# Patient Record
Sex: Female | Born: 1960 | Race: Black or African American | Hispanic: No | Smoking: Never smoker
Health system: Southern US, Community
[De-identification: ages and names within clinical notes are randomized; demographics above are authoritative.]

## PROBLEM LIST (undated history)

## (undated) DIAGNOSIS — J189 Pneumonia, unspecified organism: Secondary | ICD-10-CM

## (undated) DIAGNOSIS — F419 Anxiety disorder, unspecified: Secondary | ICD-10-CM

## (undated) DIAGNOSIS — Z973 Presence of spectacles and contact lenses: Secondary | ICD-10-CM

## (undated) DIAGNOSIS — J45909 Unspecified asthma, uncomplicated: Secondary | ICD-10-CM

## (undated) DIAGNOSIS — G43909 Migraine, unspecified, not intractable, without status migrainosus: Secondary | ICD-10-CM

## (undated) DIAGNOSIS — N879 Dysplasia of cervix uteri, unspecified: Secondary | ICD-10-CM

## (undated) DIAGNOSIS — M1990 Unspecified osteoarthritis, unspecified site: Secondary | ICD-10-CM

---

## 1997-03-23 ENCOUNTER — Inpatient Hospital Stay (HOSPITAL_COMMUNITY): Admission: AD | Admit: 1997-03-23 | Discharge: 1997-03-23 | Payer: Self-pay | Admitting: Obstetrics

## 1997-04-07 ENCOUNTER — Encounter: Admission: RE | Admit: 1997-04-07 | Discharge: 1997-07-06 | Payer: Self-pay | Admitting: Obstetrics

## 1997-04-25 ENCOUNTER — Ambulatory Visit (HOSPITAL_COMMUNITY): Admission: RE | Admit: 1997-04-25 | Discharge: 1997-04-25 | Payer: Self-pay | Admitting: Obstetrics & Gynecology

## 1997-07-12 ENCOUNTER — Encounter: Admission: RE | Admit: 1997-07-12 | Discharge: 1997-10-10 | Payer: Self-pay | Admitting: Obstetrics

## 1997-07-27 ENCOUNTER — Ambulatory Visit (HOSPITAL_COMMUNITY): Admission: RE | Admit: 1997-07-27 | Discharge: 1997-07-27 | Payer: Self-pay | Admitting: Obstetrics & Gynecology

## 1997-09-21 ENCOUNTER — Inpatient Hospital Stay (HOSPITAL_COMMUNITY): Admission: AD | Admit: 1997-09-21 | Discharge: 1997-09-28 | Payer: Self-pay | Admitting: Obstetrics

## 1997-09-29 ENCOUNTER — Ambulatory Visit (HOSPITAL_COMMUNITY): Admission: RE | Admit: 1997-09-29 | Discharge: 1997-09-29 | Payer: Self-pay | Admitting: Obstetrics & Gynecology

## 1997-09-29 ENCOUNTER — Encounter: Payer: Self-pay | Admitting: Obstetrics & Gynecology

## 1997-09-29 ENCOUNTER — Inpatient Hospital Stay (HOSPITAL_COMMUNITY): Admission: AD | Admit: 1997-09-29 | Discharge: 1997-10-04 | Payer: Self-pay | Admitting: Obstetrics & Gynecology

## 1997-09-29 ENCOUNTER — Emergency Department (HOSPITAL_COMMUNITY): Admission: EM | Admit: 1997-09-29 | Discharge: 1997-09-29 | Payer: Self-pay | Admitting: Emergency Medicine

## 1997-10-02 ENCOUNTER — Encounter: Payer: Self-pay | Admitting: Pulmonary Disease

## 1997-10-03 ENCOUNTER — Encounter: Payer: Self-pay | Admitting: Pulmonary Disease

## 1997-11-22 ENCOUNTER — Emergency Department (HOSPITAL_COMMUNITY): Admission: EM | Admit: 1997-11-22 | Discharge: 1997-11-22 | Payer: Self-pay | Admitting: Emergency Medicine

## 1998-02-06 ENCOUNTER — Emergency Department (HOSPITAL_COMMUNITY): Admission: EM | Admit: 1998-02-06 | Discharge: 1998-02-06 | Payer: Self-pay | Admitting: Emergency Medicine

## 1998-06-18 ENCOUNTER — Emergency Department (HOSPITAL_COMMUNITY): Admission: EM | Admit: 1998-06-18 | Discharge: 1998-06-18 | Payer: Self-pay | Admitting: Unknown Physician Specialty

## 1998-06-20 ENCOUNTER — Emergency Department (HOSPITAL_COMMUNITY): Admission: EM | Admit: 1998-06-20 | Discharge: 1998-06-20 | Payer: Self-pay | Admitting: Emergency Medicine

## 1999-02-10 ENCOUNTER — Emergency Department (HOSPITAL_COMMUNITY): Admission: EM | Admit: 1999-02-10 | Discharge: 1999-02-10 | Payer: Self-pay | Admitting: Emergency Medicine

## 1999-02-10 ENCOUNTER — Encounter: Payer: Self-pay | Admitting: Emergency Medicine

## 1999-03-14 ENCOUNTER — Emergency Department (HOSPITAL_COMMUNITY): Admission: EM | Admit: 1999-03-14 | Discharge: 1999-03-14 | Payer: Self-pay | Admitting: Emergency Medicine

## 1999-09-03 ENCOUNTER — Emergency Department (HOSPITAL_COMMUNITY): Admission: EM | Admit: 1999-09-03 | Discharge: 1999-09-03 | Payer: Self-pay | Admitting: Emergency Medicine

## 1999-12-17 ENCOUNTER — Emergency Department (HOSPITAL_COMMUNITY): Admission: EM | Admit: 1999-12-17 | Discharge: 1999-12-17 | Payer: Self-pay | Admitting: Emergency Medicine

## 2000-03-04 ENCOUNTER — Emergency Department (HOSPITAL_COMMUNITY): Admission: EM | Admit: 2000-03-04 | Discharge: 2000-03-04 | Payer: Self-pay | Admitting: *Deleted

## 2000-04-13 ENCOUNTER — Emergency Department (HOSPITAL_COMMUNITY): Admission: EM | Admit: 2000-04-13 | Discharge: 2000-04-13 | Payer: Self-pay | Admitting: Emergency Medicine

## 2000-04-15 ENCOUNTER — Inpatient Hospital Stay (HOSPITAL_COMMUNITY): Admission: EM | Admit: 2000-04-15 | Discharge: 2000-04-18 | Payer: Self-pay | Admitting: Emergency Medicine

## 2000-08-09 ENCOUNTER — Emergency Department (HOSPITAL_COMMUNITY): Admission: EM | Admit: 2000-08-09 | Discharge: 2000-08-09 | Payer: Self-pay

## 2000-09-08 ENCOUNTER — Emergency Department (HOSPITAL_COMMUNITY): Admission: EM | Admit: 2000-09-08 | Discharge: 2000-09-08 | Payer: Self-pay | Admitting: Emergency Medicine

## 2001-10-18 ENCOUNTER — Emergency Department (HOSPITAL_COMMUNITY): Admission: EM | Admit: 2001-10-18 | Discharge: 2001-10-18 | Payer: Self-pay | Admitting: *Deleted

## 2001-12-19 ENCOUNTER — Emergency Department (HOSPITAL_COMMUNITY): Admission: EM | Admit: 2001-12-19 | Discharge: 2001-12-19 | Payer: Self-pay | Admitting: Emergency Medicine

## 2002-03-20 ENCOUNTER — Encounter: Payer: Self-pay | Admitting: Emergency Medicine

## 2002-03-20 ENCOUNTER — Emergency Department (HOSPITAL_COMMUNITY): Admission: EM | Admit: 2002-03-20 | Discharge: 2002-03-20 | Payer: Self-pay | Admitting: Emergency Medicine

## 2002-05-09 ENCOUNTER — Emergency Department (HOSPITAL_COMMUNITY): Admission: EM | Admit: 2002-05-09 | Discharge: 2002-05-09 | Payer: Self-pay | Admitting: Emergency Medicine

## 2002-07-22 ENCOUNTER — Ambulatory Visit (HOSPITAL_COMMUNITY): Admission: RE | Admit: 2002-07-22 | Discharge: 2002-07-22 | Payer: Self-pay | Admitting: Obstetrics

## 2002-07-22 ENCOUNTER — Encounter: Payer: Self-pay | Admitting: Obstetrics

## 2002-07-28 ENCOUNTER — Encounter (INDEPENDENT_AMBULATORY_CARE_PROVIDER_SITE_OTHER): Payer: Self-pay

## 2002-07-28 ENCOUNTER — Ambulatory Visit (HOSPITAL_COMMUNITY): Admission: RE | Admit: 2002-07-28 | Discharge: 2002-07-28 | Payer: Self-pay | Admitting: Obstetrics

## 2002-07-28 HISTORY — PX: HYSTEROSCOPY WITH D & C: SHX1775

## 2003-02-04 ENCOUNTER — Emergency Department (HOSPITAL_COMMUNITY): Admission: EM | Admit: 2003-02-04 | Discharge: 2003-02-04 | Payer: Self-pay | Admitting: Emergency Medicine

## 2003-03-05 ENCOUNTER — Ambulatory Visit (HOSPITAL_BASED_OUTPATIENT_CLINIC_OR_DEPARTMENT_OTHER): Admission: RE | Admit: 2003-03-05 | Discharge: 2003-03-05 | Payer: Self-pay | Admitting: Internal Medicine

## 2003-08-08 ENCOUNTER — Encounter: Admission: RE | Admit: 2003-08-08 | Discharge: 2003-11-06 | Payer: Self-pay | Admitting: *Deleted

## 2003-10-08 ENCOUNTER — Emergency Department (HOSPITAL_COMMUNITY): Admission: EM | Admit: 2003-10-08 | Discharge: 2003-10-08 | Payer: Self-pay | Admitting: Unknown Physician Specialty

## 2003-10-10 ENCOUNTER — Emergency Department (HOSPITAL_COMMUNITY): Admission: EM | Admit: 2003-10-10 | Discharge: 2003-10-10 | Payer: Self-pay | Admitting: Emergency Medicine

## 2003-12-25 ENCOUNTER — Emergency Department (HOSPITAL_COMMUNITY): Admission: EM | Admit: 2003-12-25 | Discharge: 2003-12-25 | Payer: Self-pay | Admitting: Emergency Medicine

## 2004-01-10 ENCOUNTER — Emergency Department (HOSPITAL_COMMUNITY): Admission: EM | Admit: 2004-01-10 | Discharge: 2004-01-10 | Payer: Self-pay | Admitting: Emergency Medicine

## 2005-01-29 ENCOUNTER — Emergency Department (HOSPITAL_COMMUNITY): Admission: EM | Admit: 2005-01-29 | Discharge: 2005-01-30 | Payer: Self-pay | Admitting: *Deleted

## 2005-12-17 ENCOUNTER — Emergency Department (HOSPITAL_COMMUNITY): Admission: EM | Admit: 2005-12-17 | Discharge: 2005-12-17 | Payer: Self-pay | Admitting: Emergency Medicine

## 2006-07-19 ENCOUNTER — Emergency Department (HOSPITAL_COMMUNITY): Admission: EM | Admit: 2006-07-19 | Discharge: 2006-07-19 | Payer: Self-pay | Admitting: Emergency Medicine

## 2009-03-05 ENCOUNTER — Emergency Department (HOSPITAL_COMMUNITY): Admission: EM | Admit: 2009-03-05 | Discharge: 2009-03-05 | Payer: Self-pay | Admitting: Emergency Medicine

## 2009-04-15 ENCOUNTER — Inpatient Hospital Stay (HOSPITAL_COMMUNITY): Admission: EM | Admit: 2009-04-15 | Discharge: 2009-04-19 | Payer: Self-pay | Admitting: Emergency Medicine

## 2009-04-15 DIAGNOSIS — J13 Pneumonia due to Streptococcus pneumoniae: Secondary | ICD-10-CM | POA: Insufficient documentation

## 2009-04-15 DIAGNOSIS — J45909 Unspecified asthma, uncomplicated: Secondary | ICD-10-CM | POA: Insufficient documentation

## 2009-04-15 DIAGNOSIS — G43909 Migraine, unspecified, not intractable, without status migrainosus: Secondary | ICD-10-CM | POA: Insufficient documentation

## 2009-04-15 LAB — CONVERTED CEMR LAB
CO2: 25 meq/L
Calcium: 9.1 mg/dL
Chloride: 110 meq/L
Creatinine, Ser: 0.86 mg/dL
Glucose, Bld: 122 mg/dL
MCHC: 34.1 g/dL
RBC: 4.07 M/uL
RDW: 13.9 %
WBC: 6.1 10*3/uL

## 2009-05-10 ENCOUNTER — Ambulatory Visit: Payer: Self-pay | Admitting: Internal Medicine

## 2009-05-10 DIAGNOSIS — F411 Generalized anxiety disorder: Secondary | ICD-10-CM | POA: Insufficient documentation

## 2009-05-10 DIAGNOSIS — E669 Obesity, unspecified: Secondary | ICD-10-CM | POA: Insufficient documentation

## 2010-02-06 NOTE — Assessment & Plan Note (Signed)
Summary: New - Establish Care   Vital Signs:  Patient profile:   50 year old female LMP:     04/2009 Height:      61.50 inches Weight:      233.5 pounds BMI:     43.56 BSA:     2.03 O2 Sat:      100 % Temp:     97.9 degrees F oral Pulse rate:   75 / minute Pulse rhythm:   regular Resp:     20 per minute BP sitting:   120 / 90  (left arm) Cuff size:   regular  Vitals Entered By: Levon Hedger (May 10, 2009 11:54 AM)  Serial Vital Signs/Assessments:  Comments: 12:13 PM P/F  300, 300, 300 By: Levon Hedger   CC: follow-up visit Audrey Weeks...cough, chest discomfort, Depression Is Patient Diabetic? No Pain Assessment Patient in pain? yes     Location: chest  Does patient need assistance? Functional Status Self care Ambulation Normal Comments pt did not get Flovent too expensive. LMP (date): 04/2009     Enter LMP: 04/2009   CC:  follow-up visit Audrey Weeks...cough, chest discomfort, and Depression.  History of Present Illness:  Pt into the office to [name] care. Pt was last seen by Dr. Viann Shove at [facility] last 3 years ago.  Hospitalized from 04/15/2009 to 04/19/2009 for community acquired pneumonia Presented to the ER with increasing shortness of breath for 2-3 days prior to presentation.  Pt was treating herself at home for cough and congestion but symptoms persisted.   Pt was treated with IV avelox and she was then switched to levaquin for 5 days of which she has finished. 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    Depression History:      The patient denies recurrent thoughts of death or suicide.      Psychosocial stress factors include major life changes.  The patient denies that she feels like life is not worth living, denies that she wishes that she were dead, and denies that she has thought about ending her life.        Comments:  Started on clonazepam during her recent hospitalization.  She was given 60 tablets and has been taking very sparingly.  She was previously taken effexor in the past and she tolerated well but symptoms improved so she stopped taking the medication.  Depression Treatment History:  Prior Medication Used:   Start Date: Assessment of Effect:   Comments:  Effexor (venlafaxine)     --       --       started    Habits & Providers  Alcohol-Tobacco-Diet     Alcohol drinks/day: <1     Tobacco Status: never  Exercise-Depression-Behavior     Does Patient Exercise: no     Drug Use: past  Allergies (verified): No Known Drug Allergies  Past History:  Past Surgical History: tubal ligation c-section  Family History: maternal grandmother - diabetes  Social History: 5 children Tobacco - none ETOH -social Drug - recovering addict sonce 2000Smoking Status:  never Drug Use:  past Does Patient Exercise:  no  Review of Systems Resp:  Complains of chest discomfort, cough, shortness of breath, and wheezing. GI:  Denies nausea and vomiting. MS:  back pain that started with recent pneumonia illness. Psych:  Complains of anxiety; recent loss of father and stessors from adult sons .  Physical Exam  General:  alert.  obese Head:  normocephalic.   Ears:  ear piercing(s) noted.   Lungs:  poor airmovement diffuse wheezes throughout Msk:  normal ROM.   Neurologic:  alert & oriented X3.     Impression & Recommendations:  Problem # 1:  ASTHMA (ICD-493.90) Will give neb today in office will given depomedrol IM pt given a sample of flovent advised her to continue mucinex The following medications were removed from the medication list:    Prednisone 20 Mg  Tabs (Prednisone) .Marland Kitchen... Take as directed per sheet Her updated medication list for this problem includes:    Ventolin Hfa 108 (90 Base) Mcg/act Aers (Albuterol sulfate) .Marland Kitchen..Marland Kitchen Two puffs three times per day as needed for shortness of breath    Flovent Diskus 250 Mcg/blist Aepb (Fluticasone propionate (inhal)) ..... One inhalation two times a day  Orders: Peak Flow Rate (94150) Pulse Oximetry (single measurment) (16109) Depo- Medrol 80mg  (J1040) Admin of Therapeutic Inj  intramuscular or subcutaneous (60454) Nebulizer Tx (09811)  Problem # 2:  OBESITY (ICD-278.00) pt is trying to lose weight continue current efforts  Problem # 3:  ANXIETY (ICD-300.00) will restart effexor as pt has been on previously encouraged pt to take clonazepam sparingly Her updated medication list for this problem includes:    Clonazepam 0.5 Mg Tabs (Clonazepam) ..... One - half tablet by mouth two times a day as needed    Effexor Xr 75 Mg Xr24h-cap (Venlafaxine hcl) ..... One capsule by mouth daily for mood  Complete Medication List: 1)  Clonazepam 0.5 Mg Tabs (Clonazepam) .... One - half tablet by mouth two times a day as needed 2)  Ventolin Hfa 108 (90 Base) Mcg/act Aers (Albuterol sulfate) .... Two puffs three times per day as needed for shortness of breath 3)  Flovent Diskus 250 Mcg/blist Aepb (Fluticasone propionate (inhal)) .... One inhalation two times a day 4)  Effexor Xr 75 Mg Xr24h-cap (Venlafaxine hcl) .... One capsule by mouth daily for mood 5)  Promethazine-codeine 6.25-10 Mg/12ml Syrp (Promethazine-codeine) .... One teaspoon every 6 hours as needed for cough  Asthma Management Plan    Personal best PEF: 300 liters/minute    Predicted PEF: 457 liters/minute    Working PEF: 457 liters/minute    Plan based on PEF formula: [employer] [employer] Zone: (Range: 370 to 460)  FLOVENT DISKUS 250 MCG/BLIST AEPB:  1 inhalation twice a day  Yellow Zone: VENTOLIN HFA 108 (90 BASE) MCG/ACT AERS:  2 puffs  every 4 hours as needed  Red Zone: Call to be seen if no improvement in 48hrs (sooner if worse).    Patient Instructions: 1)  You have been given a breathng treatment along with injections of medications that will help open your airway. 2)  Start flovent - inhale twice daily  3)  May use albuterol inhaler as needed  4)  Restart the mucinex and take twice daily 5)  Be sure to drink plenty of water as this helps to loosen secretions 6)  Keep your eligibility appointment on June 28, 2009.  You can try to walk in for an earlier appointment. 7)  Follow up in this office after  you receive your orange card in 8 weeks. Prescriptions: PROMETHAZINE-CODEINE 6.25-10 MG/5ML SYRP (PROMETHAZINE-CODEINE) One teaspoon every 6 hours as needed for cough  #4 ounces x 0   Entered and Authorized by:   Lehman Prom FNP   Signed by:   Lehman Prom FNP on 05/10/2009   Method used:   Print then Give to Patient   RxID:   [id number] FLOVENT DISKUS 250 MCG/BLIST AEPB (FLUTICASONE PROPIONATE (INHAL)) One inhalation two times a day  #1 x 3   Entered and Authorized by:   Lehman Prom FNP   Signed by:   Lehman Prom FNP on 05/10/2009   Method used:   Print then Give to Patient   RxID:   [id number] FLOVENT DISKUS 250 MCG/BLIST AEPB (FLUTICASONE PROPIONATE (INHAL)) One inhalation two times a day  #1 x 0   Entered and Authorized by:   Lehman Prom FNP   Signed by:   Lehman Prom FNP on 05/10/2009   Method used:   Samples Given   RxID:   [id number] EFFEXOR XR 75 MG XR24H-CAP (VENLAFAXINE HCL) One capsule by mouth daily for mood  #30 x 3   Entered and Authorized by:   Lehman Prom FNP   Signed by:   Lehman Prom FNP on 05/10/2009   Method used:   Print then Give to Patient   RxID:   [id number]    CXR  Procedure date:  04/16/2009  Findings:      low lung volume with bilateral air space disease which may represent pneumonia    Medication  Administration  Injection # 1:    Medication: Depo- Medrol 80mg     Diagnosis: ASTHMA (ICD-493.90)    Route: IM    Site: RUOQ gluteus    Exp Date: 11/06/2009    Lot #: [id number]    Mfr: Pharmacia    Comments: [id number]    Patient tolerated injection without complications    Given by: Vesta Mixer CMA (May 10, 2009 1:31 PM)  Orders Added: 1)  New Patient age 77-64 [8] 2)  Peak Flow Rate [94150] 3)  Pulse Oximetry (single measurment) [94760] 4)  Depo- Medrol 80mg  [J1040] 5)  Admin of Therapeutic Inj  intramuscular or subcutaneous [96372] 6)  Nebulizer Tx [32951]

## 2010-02-07 ENCOUNTER — Emergency Department (HOSPITAL_COMMUNITY): Payer: Self-pay

## 2010-02-07 ENCOUNTER — Emergency Department (HOSPITAL_COMMUNITY)
Admission: EM | Admit: 2010-02-07 | Discharge: 2010-02-07 | Disposition: A | Discharge status: Home / Self Care | Payer: Self-pay | Attending: Emergency Medicine | Admitting: Emergency Medicine

## 2010-02-07 DIAGNOSIS — J40 Bronchitis, not specified as acute or chronic: Secondary | ICD-10-CM | POA: Insufficient documentation

## 2010-02-07 DIAGNOSIS — J45909 Unspecified asthma, uncomplicated: Secondary | ICD-10-CM | POA: Insufficient documentation

## 2010-02-07 DIAGNOSIS — R05 Cough: Secondary | ICD-10-CM | POA: Insufficient documentation

## 2010-02-07 DIAGNOSIS — R059 Cough, unspecified: Secondary | ICD-10-CM | POA: Insufficient documentation

## 2010-03-16 ENCOUNTER — Emergency Department (HOSPITAL_COMMUNITY)
Admission: EM | Admit: 2010-03-16 | Discharge: 2010-03-16 | Disposition: A | Discharge status: Home / Self Care | Payer: Self-pay | Attending: Emergency Medicine | Admitting: Emergency Medicine

## 2010-03-16 DIAGNOSIS — J069 Acute upper respiratory infection, unspecified: Secondary | ICD-10-CM | POA: Insufficient documentation

## 2010-03-16 DIAGNOSIS — G43909 Migraine, unspecified, not intractable, without status migrainosus: Secondary | ICD-10-CM | POA: Insufficient documentation

## 2010-03-16 DIAGNOSIS — J029 Acute pharyngitis, unspecified: Secondary | ICD-10-CM | POA: Insufficient documentation

## 2010-03-16 LAB — RAPID STREP SCREEN (MED CTR MEBANE ONLY): Streptococcus, Group A Screen (Direct): NEGATIVE

## 2010-03-28 LAB — HEPATIC FUNCTION PANEL
Bilirubin, Direct: 0.1 mg/dL (ref 0.0–0.3)
Indirect Bilirubin: 0.2 mg/dL — ABNORMAL LOW (ref 0.3–0.9)
Total Bilirubin: 0.3 mg/dL (ref 0.3–1.2)
Total Protein: 6.8 g/dL (ref 6.0–8.3)

## 2010-03-28 LAB — CBC
HCT: 35.4 % — ABNORMAL LOW (ref 36.0–46.0)
HCT: 36.7 % (ref 36.0–46.0)
Hemoglobin: 12.1 g/dL (ref 12.0–15.0)
MCHC: 33.6 g/dL (ref 30.0–36.0)
MCHC: 34.1 g/dL (ref 30.0–36.0)
MCV: 87.7 fL (ref 78.0–100.0)
Platelets: 238 10*3/uL (ref 150–400)
RDW: 13.9 % (ref 11.5–15.5)
WBC: 5.9 10*3/uL (ref 4.0–10.5)

## 2010-03-28 LAB — BASIC METABOLIC PANEL
BUN: 6 mg/dL (ref 6–23)
Calcium: 8.8 mg/dL (ref 8.4–10.5)
GFR calc Af Amer: >60 mL/min (ref >60)
GFR calc non Af Amer: >60 mL/min (ref >60)
Glucose, Bld: 133 mg/dL — ABNORMAL HIGH (ref 70–99)

## 2010-03-28 LAB — HEMOGLOBIN A1C
Hgb A1c MFr Bld: 6.3 % — ABNORMAL HIGH (ref 4.6–6.1)
Mean Plasma Glucose: 134 mg/dL

## 2010-03-28 LAB — LEGIONELLA ANTIGEN, URINE: Legionella Antigen, Urine: NEGATIVE

## 2010-03-28 LAB — CULTURE, BLOOD (ROUTINE X 2): Culture: NO GROWTH

## 2010-03-28 LAB — EXPECTORATED SPUTUM ASSESSMENT W GRAM STAIN, RFLX TO RESP C

## 2010-03-28 LAB — COMPREHENSIVE METABOLIC PANEL
Alkaline Phosphatase: 48 U/L (ref 39–117)
BUN: 9 mg/dL (ref 6–23)
Calcium: 9.1 mg/dL (ref 8.4–10.5)
GFR calc non Af Amer: >60 mL/min (ref >60)
Glucose, Bld: 122 mg/dL — ABNORMAL HIGH (ref 70–99)
Potassium: 4.2 mEq/L (ref 3.5–5.1)
Total Protein: 6.7 g/dL (ref 6.0–8.3)

## 2010-03-28 LAB — RAPID URINE DRUG SCREEN, HOSP PERFORMED
Barbiturates: NOT DETECTED
Opiates: POSITIVE — AB

## 2010-03-28 LAB — URINE CULTURE: Special Requests: NEGATIVE

## 2010-03-28 LAB — URINALYSIS, ROUTINE W REFLEX MICROSCOPIC
Glucose, UA: NEGATIVE mg/dL
Ketones, ur: NEGATIVE mg/dL
Nitrite: NEGATIVE
Protein, ur: NEGATIVE mg/dL
Specific Gravity, Urine: 1.009 (ref 1.005–1.030)
Urobilinogen, UA: 0.2 mg/dL (ref 0.0–1.0)

## 2010-03-28 LAB — DIFFERENTIAL
Eosinophils Absolute: 0 10*3/uL (ref 0.0–0.7)
Lymphs Abs: 2.9 10*3/uL (ref 0.7–4.0)
Monocytes Relative: 8 % (ref 3–12)
Neutro Abs: 2.5 10*3/uL (ref 1.7–7.7)
Neutrophils Relative %: 42 % — ABNORMAL LOW (ref 43–77)

## 2010-03-28 LAB — GLUCOSE, CAPILLARY
Glucose-Capillary: 164 mg/dL — ABNORMAL HIGH (ref 70–99)
Glucose-Capillary: 191 mg/dL — ABNORMAL HIGH (ref 70–99)

## 2010-03-28 LAB — T3, FREE: T3, Free: 2.3 pg/mL (ref 2.3–4.2)

## 2010-03-28 LAB — CULTURE, RESPIRATORY W GRAM STAIN

## 2010-03-30 LAB — BASIC METABOLIC PANEL
BUN: 13 mg/dL (ref 6–23)
Calcium: 9.3 mg/dL (ref 8.4–10.5)
Chloride: 106 mEq/L (ref 96–112)
Creatinine, Ser: 0.83 mg/dL (ref 0.4–1.2)
GFR calc Af Amer: >60 mL/min (ref >60)
GFR calc non Af Amer: >60 mL/min (ref >60)

## 2010-03-30 LAB — CBC
Platelets: 61 10*3/uL — ABNORMAL LOW (ref 150–400)
RBC: 4.6 MIL/uL (ref 3.87–5.11)
WBC: 7.4 10*3/uL (ref 4.0–10.5)

## 2010-03-30 LAB — POCT CARDIAC MARKERS
Myoglobin, poc: 27.1 ng/mL (ref 12–200)
Troponin i, poc: <0.05 ng/mL (ref 0.00–0.09)

## 2010-03-30 LAB — DIFFERENTIAL
Basophils Relative: 1 % (ref 0–1)
Eosinophils Relative: 2 % (ref 0–5)
Lymphocytes Relative: 37 % (ref 12–46)
Monocytes Relative: 5 % (ref 3–12)
Neutrophils Relative %: 55 % (ref 43–77)

## 2010-05-25 NOTE — Discharge Summary (Signed)
[place]  Patient:    Audrey Weeks, Audrey Weeks                       MRN: [id number] Adm. Date:  04/15/00 Disc. Date: 04/18/00 Attending:  Margit Banda. Jearld Fenton, M.D.                           Discharge Summary  ADMISSION DIAGNOSIS:  Severe tonsillitis with dehydration.  DISCHARGE DIAGNOSIS:  Severe tonsillitis with dehydration.  HISTORY OF PRESENT ILLNESS:  This is a 50 year old who has had a severe onset of tonsillitis.  She has multiple episodes of tonsillitis each year, but this is a significantly worse episode.  She has been treated as an outpatient with antibiotic therapy including 875 mg of Augmentin.  She also has had a Medrol Dosepak and continues to have severe pain and inability to take any by-mouth fluids.  She was admitted for IV hydration and IV therapy.  HOSPITAL COURSE:  She did well in the hospital and resolved reasonably quickly on the regimen of steroids and Unasyn antibiotics.  On April 12, she had significant decrease in the swelling of the tonsil with almost normal appearance and no exudate on the tonsils. There was definitely no evidence of any abscess.  The adenopathy had drastically improved in her neck.  She was able to take fluids and looked well hydrated.  She was discharged to home to continue her Augmentin that had been prescribed as an outpatient.  She also had Lortab elixir for pain.  She would follow up in the office to discuss the possibility of a tonsillectomy. DD:  05/05/00 TD:  05/06/00 Job: [id number]

## 2010-05-25 NOTE — Op Note (Signed)
   [name], Weeks                          ACCOUNT NO.:  [id number]   MEDICAL RECORD NO.:  [id number]                   PATIENT TYPE:  OUT   LOCATION:  [facility]                                  FACILITY:  [facility]   PHYSICIAN:  Kathreen Cosier, M.D.           DATE OF BIRTH:  [birth year]   DATE OF PROCEDURE:  07/28/2002  DATE OF DISCHARGE:                                 OPERATIVE REPORT   PREOPERATIVE DIAGNOSIS:  Dysfunctional uterine bleeding.   PROCEDURE:  Diagnostic D&C and hysteroscopy.   ANESTHESIA:  General anesthesia.   SURGEON:  Kathreen Cosier, M.D.   DESCRIPTION OF PROCEDURE:  Under general anesthesia with the patient in the  __________ position, perineum, abdomen and vagina were prepped and draped.  Bladder emptied with straight catheter.  Bimanual  examination revealed the  uterus to be top normal size, negative adnexa.  Weighted speculum placed in  the vagina.  Cervix grasped with tenaculum and cervix injected with 8 mL of  2% plain Xylocaine at 3, 9 and 12 o'clock.  The endocervix was curetted. A  small amount of tissue obtained.  The endometrial cavity sounded  10 cm.  The cavity was anterior.  The cervix was dilated with #25 Shawnie Pons and  the 5 mm scope placed in the uterine cavity.  Examination of the uterine  cavity revealed no abnormalities.  Scope was removed and a sharp curettage  was performed.  A large amount of tissue was obtained.  There were no polyps  noted.  The deficit of 3% sorbitol was 40 mL.  The patient tolerated the  procedure well and was taken to the recovery room in good condition.                                               Kathreen Cosier, M.D.    BAM/MEDQ  D:  07/28/2002  T:  07/28/2002  Job:  [id number]

## 2010-08-22 ENCOUNTER — Inpatient Hospital Stay (INDEPENDENT_AMBULATORY_CARE_PROVIDER_SITE_OTHER)
Admission: RE | Admit: 2010-08-22 | Discharge: 2010-08-22 | Disposition: A | Discharge status: Home / Self Care | Payer: Medicaid Other | Source: Ambulatory Visit | Attending: Family Medicine | Admitting: Family Medicine

## 2010-08-22 DIAGNOSIS — F4320 Adjustment disorder, unspecified: Secondary | ICD-10-CM

## 2010-10-11 ENCOUNTER — Emergency Department (HOSPITAL_COMMUNITY): Payer: Medicaid Other

## 2010-10-11 ENCOUNTER — Emergency Department (HOSPITAL_COMMUNITY)
Admission: EM | Admit: 2010-10-11 | Discharge: 2010-10-12 | Disposition: A | Discharge status: Home / Self Care | Payer: Medicaid Other | Attending: Emergency Medicine | Admitting: Emergency Medicine

## 2010-10-11 DIAGNOSIS — R05 Cough: Secondary | ICD-10-CM | POA: Insufficient documentation

## 2010-10-11 DIAGNOSIS — J40 Bronchitis, not specified as acute or chronic: Secondary | ICD-10-CM | POA: Insufficient documentation

## 2010-10-11 DIAGNOSIS — R059 Cough, unspecified: Secondary | ICD-10-CM | POA: Insufficient documentation

## 2010-10-11 DIAGNOSIS — R51 Headache: Secondary | ICD-10-CM | POA: Insufficient documentation

## 2010-10-11 DIAGNOSIS — J45909 Unspecified asthma, uncomplicated: Secondary | ICD-10-CM | POA: Insufficient documentation

## 2010-10-11 DIAGNOSIS — J329 Chronic sinusitis, unspecified: Secondary | ICD-10-CM | POA: Insufficient documentation

## 2011-02-03 ENCOUNTER — Emergency Department (HOSPITAL_COMMUNITY)
Admission: EM | Admit: 2011-02-03 | Discharge: 2011-02-03 | Disposition: A | Discharge status: Home / Self Care | Payer: Medicaid Other | Attending: Emergency Medicine | Admitting: Emergency Medicine

## 2011-02-03 ENCOUNTER — Encounter (HOSPITAL_COMMUNITY): Payer: Self-pay | Admitting: *Deleted

## 2011-02-03 DIAGNOSIS — R29810 Facial weakness: Secondary | ICD-10-CM | POA: Insufficient documentation

## 2011-02-03 DIAGNOSIS — G510 Bell's palsy: Secondary | ICD-10-CM | POA: Insufficient documentation

## 2011-02-03 DIAGNOSIS — G43909 Migraine, unspecified, not intractable, without status migrainosus: Secondary | ICD-10-CM | POA: Insufficient documentation

## 2011-02-03 HISTORY — DX: Migraine, unspecified, not intractable, without status migrainosus: G43.909

## 2011-02-03 MED ORDER — OXYCODONE-ACETAMINOPHEN 5-325 MG PO TABS
2.0000 | ORAL_TABLET | Freq: Once | ORAL | Status: AC
Start: 2011-02-03 — End: 2011-02-03
  Administered 2011-02-03: 2 via ORAL
  Filled 2011-02-03: qty 2

## 2011-02-03 MED ORDER — POLYVINYL ALCOHOL 1.4 % OP SOLN
1.0000 [drp] | Freq: Once | OPHTHALMIC | Status: AC
  Administered 2011-02-03: 1 [drp] via OPHTHALMIC
  Filled 2011-02-03 (×2): qty 15

## 2011-02-03 MED ORDER — VALACYCLOVIR HCL 1 G PO TABS: 1000.0000 mg | ORAL_TABLET | Freq: Two times a day (BID) | ORAL | Status: AC

## 2011-02-03 MED ORDER — VALACYCLOVIR HCL 500 MG PO TABS
1000.0000 mg | ORAL_TABLET | Freq: Once | ORAL | Status: AC
  Administered 2011-02-03: 1000 mg via ORAL
  Filled 2011-02-03: qty 2

## 2011-02-03 MED ORDER — PROMETHAZINE HCL 25 MG/ML IJ SOLN
25.0000 mg | Freq: Four times a day (QID) | INTRAMUSCULAR | Status: DC | PRN
  Administered 2011-02-03: 25 mg via INTRAMUSCULAR
  Filled 2011-02-03: qty 1

## 2011-02-03 MED ORDER — TEARS RENEWED OP SOLN: OPHTHALMIC | Status: DC

## 2011-02-03 MED ORDER — ARTIFICIAL TEARS OP OINT
TOPICAL_OINTMENT | Freq: Once | OPHTHALMIC | Status: DC
  Administered 2011-02-03: 16:00:00 via OPHTHALMIC
  Filled 2011-02-03: qty 3.5

## 2011-02-03 NOTE — ED Provider Notes (Signed)
History     CSN: [id number]  Arrival date & time 02/03/11  1203   First MD Initiated Contact with Patient 02/03/11 1217      Chief Complaint  Patient presents with  . Facial Droop  . Migraine    (Consider location/radiation/quality/duration/timing/severity/associated sxs/prior treatment) Patient is a 51 y.o. female presenting with migraine.  Migraine    Patient presents to emergency department complaining of acute onset right-sided facial droop and "right eye jumping and watering" x 3 days as well as gradual onset migraine. Patient states that since onset she's had difficulty closing her right eye and moving the right side of her face. She denies any slurred speech, extremity numbness or tingling, or extremity weakness. She denies fevers, chills, loss of coordination, difficulty ambulating or visual changes. Patient states she has a history of migraines with gradual onset migraine similar to her past history of migraines. Patient takes as needed ibuprofen or Tylenol for her migraines. Patient has history of anxiety and migraine is followed by both her primary care physician and a psychiatrist. Patient states that over the last 1-2 months she's had symptoms of bronchitis but denies any other known illness. She denies aggravating or alleviating factors.  Past Medical History  Diagnosis Date  . Migraines     History reviewed. No pertinent past surgical history.  History reviewed. No pertinent family history.  History  Substance Use Topics  . Smoking status: Never Smoker   . Smokeless tobacco: Never Used  . Alcohol Use: Yes     occ    OB History    Grav Para Term Preterm Abortions TAB SAB Ect Mult Living                  Review of Systems  All other systems reviewed and are negative.    Allergies  Review of patient's allergies indicates no known allergies.  Home Medications   Current Outpatient Rx  Name Route Sig Dispense Refill  . ALBUTEROL SULFATE HFA 108 (90  BASE) MCG/ACT IN AERS Inhalation Inhale 2 puffs into the lungs every 4 (four) hours as needed. For shortness of breath.    . BUPROPION HCL ER (SR) 150 MG PO TB12 Oral Take 150 mg by mouth 2 (two) times daily.    Marland Kitchen CLONAZEPAM 0.5 MG PO TABS Oral Take 0.5 mg by mouth 2 (two) times daily as needed. For anxiety.    . TRAZODONE HCL 100 MG PO TABS Oral Take 100 mg by mouth at bedtime.      BP 123/76  Pulse 76  Temp(Src) 97.8 F (36.6 C) (Oral)  Resp 20  SpO2 100%  Physical Exam  Nursing note and vitals reviewed. Constitutional: She is oriented to person, place, and time. She appears well-developed and well-nourished. No distress.  HENT:  Head: Normocephalic and atraumatic.       Right eye watering tears and EOMI intact however patient unable to spontaneously or voluntarily open and close right eyelids  Eyes: Conjunctivae and EOM are normal. Pupils are equal, round, and reactive to light.  Neck: Normal range of motion. Neck supple. No Brudzinski's sign and no Kernig's sign noted.  Cardiovascular: Normal rate, regular rhythm, normal heart sounds and intact distal pulses.  Exam reveals no gallop and no friction rub.   No murmur heard. Pulmonary/Chest: Effort normal and breath sounds normal. No respiratory distress. She has no wheezes. She has no rales. She exhibits no tenderness.  Abdominal: Bowel sounds are normal. She exhibits no distension and  no mass. There is no tenderness. There is no rebound and no guarding.  Musculoskeletal: Normal range of motion. She exhibits no edema and no tenderness.       5/5 strength of bilateral UE and LE with normal reflexes and sensation.     Neurological: She is alert and oriented to person, place, and time. She has normal reflexes. A cranial nerve deficit is present. Coordination normal.       Unable to move or wrinkle right forehead at midline. Right mouth droop.   Skin: Skin is warm and dry. No rash noted. She is not diaphoretic. No erythema.    Psychiatric: She has a normal mood and affect.    ED Course  Procedures (including critical care time)  PO valtrex, artifical tears, IM phenergan and PO percocet.   Labs Reviewed - No data to display No results found.   1. Bell palsy   2. Migraine       MDM  Right facial drooping and paralysis of right side of face involving the facial nerve but no other neuro focal findings, ataxia, or loss of coordination. No signs or symptoms suggestive of stroke. Patient is ambulating without difficulty. Spoke at length with patient about bells palsy and need to followup with her primary care physician for ongoing management. Will treat with artificial tears in Valtrex.        Jenness [place], [place] 02/03/11 1415

## 2011-02-03 NOTE — ED Provider Notes (Signed)
Medical screening examination/treatment/procedure(s) were conducted as a shared visit with non-physician practitioner(s) and myself.  I personally evaluated the patient during the encounter  Pt with bells palsy on right on exam  Lyanne Co, MD 02/03/11 1459

## 2011-02-03 NOTE — ED Notes (Signed)
Patient stable upon discharge.  

## 2011-02-03 NOTE — ED Notes (Signed)
Pt from home c/o right sided facial droop that was first noticed on Saturday morning as well as a migraine that started on Friday, pt reports increased stress, right eye "jumping and watering" on Thursday. Pt denies weakness/numbness or tingling or speech difficulty.

## 2011-02-11 ENCOUNTER — Other Ambulatory Visit: Payer: Self-pay | Admitting: Family Medicine

## 2011-02-11 ENCOUNTER — Ambulatory Visit
Admission: RE | Admit: 2011-02-11 | Discharge: 2011-02-11 | Disposition: A | Discharge status: Home / Self Care | Payer: Medicaid Other | Source: Ambulatory Visit | Attending: Family Medicine | Admitting: Family Medicine

## 2011-02-11 DIAGNOSIS — R200 Anesthesia of skin: Secondary | ICD-10-CM

## 2011-02-13 ENCOUNTER — Other Ambulatory Visit: Payer: Medicaid Other

## 2011-12-15 ENCOUNTER — Emergency Department (HOSPITAL_COMMUNITY)
Admission: EM | Admit: 2011-12-15 | Discharge: 2011-12-16 | Disposition: A | Discharge status: Home / Self Care | Payer: Medicaid Other | Attending: Emergency Medicine | Admitting: Emergency Medicine

## 2011-12-15 ENCOUNTER — Emergency Department (HOSPITAL_COMMUNITY): Payer: Medicaid Other

## 2011-12-15 ENCOUNTER — Encounter (HOSPITAL_COMMUNITY): Payer: Self-pay | Admitting: Emergency Medicine

## 2011-12-15 DIAGNOSIS — K089 Disorder of teeth and supporting structures, unspecified: Secondary | ICD-10-CM | POA: Insufficient documentation

## 2011-12-15 DIAGNOSIS — Z79899 Other long term (current) drug therapy: Secondary | ICD-10-CM | POA: Insufficient documentation

## 2011-12-15 DIAGNOSIS — G43909 Migraine, unspecified, not intractable, without status migrainosus: Secondary | ICD-10-CM | POA: Insufficient documentation

## 2011-12-15 DIAGNOSIS — R079 Chest pain, unspecified: Secondary | ICD-10-CM | POA: Insufficient documentation

## 2011-12-15 LAB — CBC WITH DIFFERENTIAL/PLATELET
Basophils Absolute: 0 10*3/uL (ref 0.0–0.1)
Eosinophils Relative: 2 % (ref 0–5)
HCT: 38.2 % (ref 36.0–46.0)
Hemoglobin: 12.9 g/dL (ref 12.0–15.0)
Lymphocytes Relative: 55 % — ABNORMAL HIGH (ref 12–46)
MCV: 83.8 fL (ref 78.0–100.0)
Monocytes Absolute: 0.6 10*3/uL (ref 0.1–1.0)
Monocytes Relative: 7 % (ref 3–12)
RDW: 13.4 % (ref 11.5–15.5)
WBC: 8.4 10*3/uL (ref 4.0–10.5)

## 2011-12-15 LAB — BASIC METABOLIC PANEL
BUN: 10 mg/dL (ref 6–23)
CO2: 24 mEq/L (ref 19–32)
Calcium: 9.5 mg/dL (ref 8.4–10.5)
Chloride: 101 mEq/L (ref 96–112)
Creatinine, Ser: 0.64 mg/dL (ref 0.50–1.10)
Glucose, Bld: 120 mg/dL — ABNORMAL HIGH (ref 70–99)

## 2011-12-15 LAB — TROPONIN I: Troponin I: <0.3 ng/mL (ref <0.30)

## 2011-12-15 MED ORDER — DIPHENHYDRAMINE HCL 50 MG/ML IJ SOLN
25.0000 mg | Freq: Once | INTRAMUSCULAR | Status: AC
  Administered 2011-12-15: 25 mg via INTRAVENOUS
  Filled 2011-12-15: qty 1

## 2011-12-15 MED ORDER — PROMETHAZINE HCL 25 MG/ML IJ SOLN
25.0000 mg | Freq: Once | INTRAMUSCULAR | Status: AC
  Administered 2011-12-15: 25 mg via INTRAVENOUS
  Filled 2011-12-15: qty 1

## 2011-12-15 MED ORDER — METOCLOPRAMIDE HCL 5 MG/ML IJ SOLN
10.0000 mg | Freq: Once | INTRAMUSCULAR | Status: AC
  Administered 2011-12-15: 10 mg via INTRAVENOUS
  Filled 2011-12-15: qty 2

## 2011-12-15 MED ORDER — SODIUM CHLORIDE 0.9 % IV BOLUS (SEPSIS)
1000.0000 mL | Freq: Once | INTRAVENOUS | Status: AC
  Administered 2011-12-15: 1000 mL via INTRAVENOUS

## 2011-12-15 MED ORDER — KETOROLAC TROMETHAMINE 30 MG/ML IJ SOLN
30.0000 mg | Freq: Once | INTRAMUSCULAR | Status: AC
  Administered 2011-12-15: 30 mg via INTRAVENOUS
  Filled 2011-12-15: qty 1

## 2011-12-15 MED ORDER — DEXAMETHASONE SODIUM PHOSPHATE 10 MG/ML IJ SOLN
4.0000 mg | Freq: Once | INTRAMUSCULAR | Status: AC
  Administered 2011-12-15: 4 mg via INTRAVENOUS
  Filled 2011-12-15: qty 1

## 2011-12-15 NOTE — ED Notes (Signed)
Pt states she has had a migraine that started 2 weeks ago  Pt states she went to her PCP on Tuesday and was given Imitrex but it is not helping  Pt states she has been having some pain in her chest that started yesterday and states her neck has been bothering her for about a month or so  Pt states her headache pain is just too bad tonight to stay at home

## 2011-12-15 NOTE — ED Provider Notes (Signed)
History     CSN: [id number]  Arrival date & time 12/15/11  2030   First MD Initiated Contact with Patient 12/15/11 2108      Chief Complaint  Patient presents with  . Migraine    (Consider location/radiation/quality/duration/timing/severity/associated sxs/prior treatment) The history is provided by the patient.  Audrey Weeks is a 51 y.o. female hx of migraines here with headache, toothache and chest pain. Migraine headache for 2 weeks, diffuse, sharp. She took Imitrex for the last 5 days but hasn't helped. No blurry vision but some photophobia. Denies fevers. For the last few days, she noticed that the pain progressed to her teeth and and her chest and back. Pain occurs when she has headaches. This is typical of her migraines. She had MRI in July this year that was normal. No nausea or vomiting. No CAD risk factors.    Past Medical History  Diagnosis Date  . Migraines     Past Surgical History  Procedure Date  . Cesarean section     Family History  Problem Relation Age of Onset  . Hypertension Other   . Diabetes Other     History  Substance Use Topics  . Smoking status: Never Smoker   . Smokeless tobacco: Never Used  . Alcohol Use: No     Comment: occ    OB History    Grav Para Term Preterm Abortions TAB SAB Ect Mult Living                  Review of Systems  HENT:       Toothache   Cardiovascular: Positive for chest pain.  Neurological: Positive for headaches.    Allergies  Review of patient's allergies indicates no known allergies.  Home Medications   Current Outpatient Rx  Name  Route  Sig  Dispense  Refill  . ALBUTEROL SULFATE HFA 108 (90 BASE) MCG/ACT IN AERS   Inhalation   Inhale 2 puffs into the lungs every 4 (four) hours as needed. For shortness of breath.         . CLONAZEPAM 0.5 MG PO TABS   Oral   Take 0.5 mg by mouth 2 (two) times daily as needed. For anxiety.         . BUPROPION HCL ER (SR) 150 MG PO TB12   Oral   Take 150  mg by mouth 2 (two) times daily.           BP 120/70  Pulse 73  Temp 98.6 F (37 C) (Oral)  Resp 12  SpO2 100%  LMP 12/08/2011  Physical Exam  Vitals reviewed. Constitutional: She is oriented to person, place, and time. She appears well-developed.       Uncomfortable, photophobic. Holding her head.   HENT:  Head: Normocephalic.  Mouth/Throat: Oropharynx is clear and moist.       Dentition poor. No obvious periapical abscess   Eyes: Conjunctivae normal are normal. Pupils are equal, round, and reactive to light.  Neck: Normal range of motion. Neck supple.  Cardiovascular: Normal rate, regular rhythm and normal heart sounds.   Pulmonary/Chest: Effort normal and breath sounds normal. No respiratory distress. She has no wheezes. She has no rales.  Abdominal: Soft. Bowel sounds are normal. She exhibits no distension. There is no tenderness. There is no rebound.  Musculoskeletal: Normal range of motion. She exhibits no edema and no tenderness.  Neurological: She is alert and oriented to person, place, and time.  Skin: Skin is  warm and dry.  Psychiatric: She has a normal mood and affect. Her behavior is normal. Judgment and thought content normal.    ED Course  Procedures (including critical care time)  Labs Reviewed  CBC WITH DIFFERENTIAL - Abnormal; Notable for the following:    Neutrophils Relative 37 (*)     Lymphocytes Relative 55 (*)     Lymphs Abs 4.6 (*)     All other components within normal limits  BASIC METABOLIC PANEL - Abnormal; Notable for the following:    Glucose, Bld 120 (*)     All other components within normal limits  TROPONIN I   Dg Chest 2 View  12/15/2011  *RADIOLOGY REPORT*  Clinical Data: Chest pain.  Migraine.  CHEST - 2 VIEW  Comparison: 10/11/2010  Findings: Shallow inspiration.  Vascular crowding.  Normal heart size and pulmonary vascularity.  No focal airspace consolidation in the lungs.  No blunting of costophrenic angles.  No pneumothorax.  Mediastinal contours appear intact.  No significant change since previous study.  IMPRESSION: No evidence of active pulmonary disease.   Original Report Authenticated By: Burman Nieves, M.D.      1. Migraine   2. Chest pain     Date: 12/15/2011  Rate: 75  Rhythm: normal sinus rhythm  QRS Axis: normal  Intervals: normal  ST/T Wave abnormalities: normal  Conduction Disutrbances:none  Narrative Interpretation:   Old EKG Reviewed: unchanged     MDM  Audrey Weeks is a 50 y.o. female here with migraines and toothache and chest pain. I think the toothache and chest pain is likely from the migraine. She is low risk for ACS. EKG unremarkable. Headache is her typical migraine. No concerning features for [facility]. Will give meds and reassess.   11:38 PM Labs nl, Trop neg x 1 (she is low risk and chest pain for more than a day). Headache improved with reglan, benadryl, toradol, decadron and phenergan. Will d/c home with neuro f/u.        Richardean Canal, MD 12/15/11 [phone]

## 2011-12-15 NOTE — ED Notes (Signed)
Pt states she has hx of migraines and has not had one in about 10 years   Pt states this headache is not like ones in the past

## 2011-12-21 ENCOUNTER — Emergency Department (HOSPITAL_COMMUNITY)
Admission: EM | Admit: 2011-12-21 | Discharge: 2011-12-22 | Disposition: A | Discharge status: Home / Self Care | Payer: Medicaid Other | Attending: Emergency Medicine | Admitting: Emergency Medicine

## 2011-12-21 ENCOUNTER — Encounter (HOSPITAL_COMMUNITY): Payer: Self-pay | Admitting: *Deleted

## 2011-12-21 DIAGNOSIS — Z79899 Other long term (current) drug therapy: Secondary | ICD-10-CM | POA: Insufficient documentation

## 2011-12-21 DIAGNOSIS — H538 Other visual disturbances: Secondary | ICD-10-CM | POA: Insufficient documentation

## 2011-12-21 DIAGNOSIS — R42 Dizziness and giddiness: Secondary | ICD-10-CM | POA: Insufficient documentation

## 2011-12-21 DIAGNOSIS — G43909 Migraine, unspecified, not intractable, without status migrainosus: Secondary | ICD-10-CM

## 2011-12-21 DIAGNOSIS — H53149 Visual discomfort, unspecified: Secondary | ICD-10-CM | POA: Insufficient documentation

## 2011-12-21 NOTE — ED Notes (Signed)
Pt states she has had a migraine for 3 weeks and that she was seen her on last Sunday,  Also complaints of tooth pain on left side and ears ringing.  Pt is sensitive to light,

## 2011-12-21 NOTE — ED Provider Notes (Signed)
History     CSN: [id number]  Arrival date & time 12/21/11  2217   First MD Initiated Contact with Patient 12/21/11 2228      Chief Complaint  Patient presents with  . Migraine    (Consider location/radiation/quality/duration/timing/severity/associated sxs/prior treatment) HPI History provided by pt and prior chart.  Per prior chart, pt seen in ED on 12/15/11 for what she described as her typical migraine.  Pain reportedly resolved w/ IV phenergan, reglan, decadron, benadryl and toradol, and pt d/c'd home w/ referral to neuro.  She returns today because the pain came back approx 2 days later and has been constant ever since.  Located on the entire left side of head, including face.  Her migraines are typically isolated to left frontal region.  Associated w/ lightheadedness, dizziness, blurred vision and photophobia.  Denies fever.  Has been taking imitrex, prescribed by her PCP, w/out relief.   Past Medical History  Diagnosis Date  . Migraines     Past Surgical History  Procedure Date  . Cesarean section     Family History  Problem Relation Age of Onset  . Hypertension Other   . Diabetes Other     History  Substance Use Topics  . Smoking status: Never Smoker   . Smokeless tobacco: Never Used  . Alcohol Use: No     Comment: occ    OB History    Grav Para Term Preterm Abortions TAB SAB Ect Mult Living                  Review of Systems  All other systems reviewed and are negative.    Allergies  Review of patient's allergies indicates no known allergies.  Home Medications   Current Outpatient Rx  Name  Route  Sig  Dispense  Refill  . ABILIFY PO   Oral   Take 1 tablet by mouth daily.         Marland Kitchen BEE POLLEN PO   Oral   Take 1 capsule by mouth daily.         Marland Kitchen BIOTIN 1000 MCG PO TABS   Oral   Take 1,000 mcg by mouth daily.         . BUPROPION HCL ER (SR) 150 MG PO TB12   Oral   Take 150 mg by mouth 2 (two) times daily.         Marland Kitchen CLONAZEPAM 0.5  MG PO TABS   Oral   Take 0.5 mg by mouth 3 (three) times daily as needed. For anxiety.         Marland Kitchen RASPBERRY KETONES 100 MG PO CAPS   Oral   Take 1 capsule by mouth daily.         . SUMATRIPTAN SUCCINATE 100 MG PO TABS   Oral   Take 100 mg by mouth 2 (two) times daily as needed. For migraine. Take 1 tablet, may repeat in 2 hours if necessary         . TRAZODONE HCL 150 MG PO TABS   Oral   Take 150 mg by mouth at bedtime.         . ALBUTEROL SULFATE HFA 108 (90 BASE) MCG/ACT IN AERS   Inhalation   Inhale 2 puffs into the lungs every 4 (four) hours as needed. For shortness of breath.           BP 125/74  Pulse 85  Temp 98.7 F (37.1 C) (Oral)  Resp 18  Ht  5\' 2"  (1.575 m)  Wt 236 lb (107.049 kg)  BMI 43.17 kg/m2  SpO2 98%  LMP 12/08/2011  Physical Exam  Nursing note and vitals reviewed. Constitutional: She is oriented to person, place, and time. She appears well-developed and well-nourished.  HENT:  Head: Normocephalic and atraumatic.       No tenderness of sinuses or temples.   Eyes:       Normal appearance  Neck: Normal range of motion. Neck supple. No rigidity. No Brudzinski's sign and no Kernig's sign noted.  Cardiovascular: Normal rate, regular rhythm and intact distal pulses.   Pulmonary/Chest: Effort normal and breath sounds normal.  Musculoskeletal: Normal range of motion.  Neurological: She is alert and oriented to person, place, and time. No sensory deficit. Coordination normal.       CN 3-12 intact.  No nystagmus.  5/5 and equal upper and lower extremity strength.  No past pointing.    Skin: Skin is warm and dry. No rash noted.  Psychiatric: She has a normal mood and affect. Her behavior is normal.    ED Course  Procedures (including critical care time)  Labs Reviewed - No data to display Ct Head Wo Contrast  12/22/2011  *RADIOLOGY REPORT*  Clinical Data: Headaches for 3 weeks.  Left tooth pain and ears ringing.  CT HEAD WITHOUT CONTRAST   Technique:  Contiguous axial images were obtained from the base of the skull through the vertex without contrast.  Comparison: 02/11/2011  Findings: The ventricles and sulci are symmetrical without significant effacement, displacement, or dilatation. No mass effect or midline shift. No abnormal extra-axial fluid collections. The grey-white matter junction is distinct. Basal cisterns are not effaced. No acute intracranial hemorrhage. No depressed skull fractures.  Visualized paranasal sinuses and mastoid air cells are not opacified.  No significant change since the previous study.  IMPRESSION: No acute intracranial abnormalities.   Original Report Authenticated By: Burman Nieves, M.D.      1. Migraine       MDM  [phone] F w/ h/o migraines, otherwise healthy, presents for second time in 1 week w/ non-traumatic headache.  Pain started 3 weeks ago, resolved for 2 days w/ migraine cocktail administered in ED, and then returned.  On exam, pt well-appearing, afebrile, no focal neuro deficits or meningeal signs.  CT head ordered d/t duration of sx as well as atypical characteristics of pain.  Pt to receive IV NS, reglan, decadon and benadryl. Will reassess shortly.  11:39 PM   Pt reports that pain has improved but is still 10/10.  Will try toradol.  CT head negative.  Results discussed w/ pt.  12:58 AM   Pt again reports that pain is improved but rates it a 10/10.  Wonders if she may be experiencing migraines because she has always been so close with her son.  1000mg  tylenol ordered for further pain control and then patient will be discharged.  1:51 AM     Otilio Miu, PA-C 12/22/11 [phone]

## 2011-12-22 ENCOUNTER — Emergency Department (HOSPITAL_COMMUNITY): Payer: Medicaid Other

## 2011-12-22 MED ORDER — DIPHENHYDRAMINE HCL 50 MG/ML IJ SOLN
25.0000 mg | Freq: Once | INTRAMUSCULAR | Status: AC
  Administered 2011-12-22: 25 mg via INTRAVENOUS
  Filled 2011-12-22: qty 1

## 2011-12-22 MED ORDER — PROMETHAZINE HCL 25 MG/ML IJ SOLN
25.0000 mg | Freq: Once | INTRAMUSCULAR | Status: AC
  Administered 2011-12-22: 25 mg via INTRAVENOUS
  Filled 2011-12-22: qty 1

## 2011-12-22 MED ORDER — KETOROLAC TROMETHAMINE 30 MG/ML IJ SOLN
30.0000 mg | Freq: Once | INTRAMUSCULAR | Status: AC
  Administered 2011-12-22: 30 mg via INTRAVENOUS
  Filled 2011-12-22: qty 1

## 2011-12-22 MED ORDER — ACETAMINOPHEN 500 MG PO TABS
1000.0000 mg | ORAL_TABLET | Freq: Once | ORAL | Status: AC
  Administered 2011-12-22: 1000 mg via ORAL
  Filled 2011-12-22: qty 2

## 2011-12-22 MED ORDER — DEXAMETHASONE SODIUM PHOSPHATE 10 MG/ML IJ SOLN
10.0000 mg | Freq: Once | INTRAMUSCULAR | Status: AC
  Administered 2011-12-22: 10 mg via INTRAVENOUS
  Filled 2011-12-22: qty 1

## 2011-12-22 NOTE — ED Notes (Signed)
Patient transported to CT 

## 2011-12-22 NOTE — ED Provider Notes (Signed)
Medical screening examination/treatment/procedure(s) were performed by non-physician practitioner and as supervising physician I was immediately available for consultation/collaboration.   Hanley Seamen, MD 12/22/11 [phone]

## 2012-08-31 ENCOUNTER — Emergency Department (INDEPENDENT_AMBULATORY_CARE_PROVIDER_SITE_OTHER)
Admission: EM | Admit: 2012-08-31 | Discharge: 2012-08-31 | Disposition: A | Discharge status: Home / Self Care | Payer: BC Managed Care – PPO | Source: Home / Self Care | Attending: Emergency Medicine | Admitting: Emergency Medicine

## 2012-08-31 ENCOUNTER — Encounter (HOSPITAL_COMMUNITY): Payer: Self-pay | Admitting: Emergency Medicine

## 2012-08-31 DIAGNOSIS — L089 Local infection of the skin and subcutaneous tissue, unspecified: Secondary | ICD-10-CM

## 2012-08-31 MED ORDER — SULFAMETHOXAZOLE-TRIMETHOPRIM 800-160 MG PO TABS: 1.0000 | ORAL_TABLET | Freq: Two times a day (BID) | ORAL | Status: DC

## 2012-08-31 NOTE — ED Notes (Signed)
C/o possible insect bite to left shoulder blade. Area is red and irritated. States notice on Saturday and area is gradually getting worse.  Pt has used alcohol to clean area. Denies fever and any other symptoms.

## 2012-08-31 NOTE — ED Provider Notes (Signed)
Medical screening examination/treatment/procedure(s) were performed by non-physician practitioner and as supervising physician I was immediately available for consultation/collaboration.  Audrey Weeks   Dearion Huot, MD 08/31/12 1809 

## 2012-08-31 NOTE — ED Provider Notes (Signed)
CSN: [id number]     Arrival date & time 08/31/12  1554 History   First MD Initiated Contact with Patient 08/31/12 1646     Chief Complaint  Patient presents with  . Insect Bite    left shoulder blade area since saturday.    (Consider location/radiation/quality/duration/timing/severity/associated sxs/prior Treatment) HPI Comments: Pt was bitten by some unknown insect 2 days ago, area is swollen, and painful, some itching.   The history is provided by the patient.    Past Medical History  Diagnosis Date  . Migraines    Past Surgical History  Procedure Laterality Date  . Cesarean section     Family History  Problem Relation Age of Onset  . Hypertension Other   . Diabetes Other    History  Substance Use Topics  . Smoking status: Never Smoker   . Smokeless tobacco: Never Used  . Alcohol Use: No     Comment: occ   OB History   Grav Para Term Preterm Abortions TAB SAB Ect Mult Living                 Review of Systems  Constitutional: Negative for fever and chills.  Skin: Positive for rash and wound.    Allergies  Review of patient's allergies indicates no known allergies.  Home Medications   Current Outpatient Rx  Name  Route  Sig  Dispense  Refill  . albuterol (PROVENTIL HFA;VENTOLIN HFA) 108 (90 BASE) MCG/ACT inhaler   Inhalation   Inhale 2 puffs into the lungs every 4 (four) hours as needed. For shortness of breath.         . ARIPiprazole (ABILIFY PO)   Oral   Take 1 tablet by mouth daily.         Marland Kitchen BEE POLLEN PO   Oral   Take 1 capsule by mouth daily.         . Biotin 1000 MCG tablet   Oral   Take 1,000 mcg by mouth daily.         Marland Kitchen buPROPion (WELLBUTRIN SR) 150 MG 12 hr tablet   Oral   Take 150 mg by mouth 2 (two) times daily.         . clonazePAM (KLONOPIN) 0.5 MG tablet   Oral   Take 0.5 mg by mouth 3 (three) times daily as needed. For anxiety.         Marland Kitchen Raspberry Ketones 100 MG CAPS   Oral   Take 1 capsule by mouth daily.         Marland Kitchen sulfamethoxazole-trimethoprim (SEPTRA DS) 800-160 MG per tablet   Oral   Take 1 tablet by mouth every 12 (twelve) hours.   14 tablet   0   . SUMAtriptan (IMITREX) 100 MG tablet   Oral   Take 100 mg by mouth 2 (two) times daily as needed. For migraine. Take 1 tablet, may repeat in 2 hours if necessary         . traZODone (DESYREL) 150 MG tablet   Oral   Take 150 mg by mouth at bedtime.          BP 124/67  Pulse 70  Temp(Src) 98.2 F (36.8 C) (Oral)  Resp 20  SpO2 100%  LMP 12/08/2011 Physical Exam  Constitutional: She appears well-developed and well-nourished. No distress.  obese  Skin: Skin is warm and dry.       ED Course  Procedures (including critical care time) Labs Review Labs Reviewed - No  data to display Imaging Review No results found.  MDM   1. Insect bite, nonvenomous, of back, infected, left, initial encounter    Concern for infection, rx bactrim DS q12 hours for 7 days, #14. Pt tetanus is up to date. Reviewed reasons for pt to seek f/u help.    Cathlyn Parsons, NP 08/31/12 [phone]

## 2013-01-07 DIAGNOSIS — Z8669 Personal history of other diseases of the nervous system and sense organs: Secondary | ICD-10-CM

## 2013-01-07 HISTORY — DX: Personal history of other diseases of the nervous system and sense organs: Z86.69

## 2013-08-31 ENCOUNTER — Encounter (HOSPITAL_COMMUNITY): Payer: Self-pay | Admitting: Emergency Medicine

## 2013-08-31 ENCOUNTER — Emergency Department (HOSPITAL_COMMUNITY)
Admission: EM | Admit: 2013-08-31 | Discharge: 2013-08-31 | Disposition: A | Discharge status: Home / Self Care | Payer: Medicaid Other | Attending: Emergency Medicine | Admitting: Emergency Medicine

## 2013-08-31 DIAGNOSIS — R110 Nausea: Secondary | ICD-10-CM | POA: Insufficient documentation

## 2013-08-31 DIAGNOSIS — Z79899 Other long term (current) drug therapy: Secondary | ICD-10-CM | POA: Diagnosis not present

## 2013-08-31 DIAGNOSIS — G43009 Migraine without aura, not intractable, without status migrainosus: Secondary | ICD-10-CM | POA: Diagnosis not present

## 2013-08-31 MED ORDER — DIPHENHYDRAMINE HCL 50 MG/ML IJ SOLN
12.5000 mg | Freq: Once | INTRAMUSCULAR | Status: AC
  Administered 2013-08-31: 12.5 mg via INTRAVENOUS
  Filled 2013-08-31: qty 1

## 2013-08-31 MED ORDER — SUMATRIPTAN SUCCINATE 100 MG PO TABS: ORAL_TABLET | ORAL | Status: DC

## 2013-08-31 MED ORDER — PROMETHAZINE HCL 25 MG PO TABS: 25.0000 mg | ORAL_TABLET | Freq: Four times a day (QID) | ORAL | Status: DC | PRN

## 2013-08-31 MED ORDER — DEXAMETHASONE SODIUM PHOSPHATE 10 MG/ML IJ SOLN
10.0000 mg | Freq: Once | INTRAMUSCULAR | Status: AC
  Administered 2013-08-31: 10 mg via INTRAVENOUS
  Filled 2013-08-31: qty 1

## 2013-08-31 MED ORDER — PROMETHAZINE HCL 25 MG/ML IJ SOLN
12.5000 mg | Freq: Once | INTRAMUSCULAR | Status: AC
  Administered 2013-08-31: 12.5 mg via INTRAVENOUS
  Filled 2013-08-31: qty 1

## 2013-08-31 MED ORDER — KETOROLAC TROMETHAMINE 30 MG/ML IJ SOLN
30.0000 mg | Freq: Once | INTRAMUSCULAR | Status: AC
  Administered 2013-08-31: 30 mg via INTRAVENOUS
  Filled 2013-08-31: qty 1

## 2013-08-31 MED ORDER — METOCLOPRAMIDE HCL 5 MG/ML IJ SOLN
10.0000 mg | Freq: Once | INTRAMUSCULAR | Status: AC
  Administered 2013-08-31: 10 mg via INTRAVENOUS
  Filled 2013-08-31: qty 2

## 2013-08-31 NOTE — ED Notes (Signed)
Pt c/o migraine that been going on past couple of days. Pt states that has intensified today with nausea but unable to vomit. Pt is sensitive to light.

## 2013-08-31 NOTE — ED Provider Notes (Signed)
CSN: [id number]     Arrival date & time 08/31/13  1809 History   First MD Initiated Contact with Patient 08/31/13 1955     Chief Complaint  Patient presents with  . Migraine  . Nausea     (Consider location/radiation/quality/duration/timing/severity/associated sxs/prior Treatment) Patient is a 53 y.o. female presenting with migraines. The history is provided by the patient. No language interpreter was used.  Migraine This is a new problem. The current episode started in the past 7 days. The problem occurs constantly. Associated symptoms include headaches and nausea. Pertinent negatives include no abdominal pain, chills, fever, myalgias, rash or vomiting. Associated symptoms comments: Frontal, throbbing headache typical of patient's migraine history. She reports nausea without vomiting and photophobia. No fever or visual changes. Last headache was 3 months ago. .    Past Medical History  Diagnosis Date  . Migraines    Past Surgical History  Procedure Laterality Date  . Cesarean section     Family History  Problem Relation Age of Onset  . Hypertension Other   . Diabetes Other    History  Substance Use Topics  . Smoking status: Never Smoker   . Smokeless tobacco: Never Used  . Alcohol Use: No     Comment: occ   OB History   Grav Para Term Preterm Abortions TAB SAB Ect Mult Living                 Review of Systems  Constitutional: Negative for fever and chills.  Eyes: Positive for photophobia. Negative for visual disturbance.  Respiratory: Negative.   Cardiovascular: Negative.   Gastrointestinal: Positive for nausea. Negative for vomiting and abdominal pain.  Musculoskeletal: Negative.  Negative for myalgias.  Skin: Negative.  Negative for rash.  Neurological: Positive for headaches.      Allergies  Review of patient's allergies indicates no known allergies.  Home Medications   Prior to Admission medications   Medication Sig Start Date End Date Taking?  Authorizing Provider  Biotin 1000 MCG tablet Take 1,000 mcg by mouth daily.   Yes Historical Provider, MD  buPROPion (WELLBUTRIN SR) 150 MG 12 hr tablet Take 150 mg by mouth 2 (two) times daily.   Yes Historical Provider, MD  clonazePAM (KLONOPIN) 0.5 MG tablet Take 0.5 mg by mouth 3 (three) times daily as needed. For anxiety.   Yes Historical Provider, MD  traZODone (DESYREL) 150 MG tablet Take 200 mg by mouth at bedtime.    Yes Historical Provider, MD   BP 138/88  Pulse 92  Temp(Src) 98.3 F (36.8 C) (Oral)  SpO2 100%  LMP 12/08/2011 Physical Exam  Constitutional: She is oriented to person, place, and time. She appears well-developed and well-nourished.  HENT:  Head: Normocephalic.  Neck: Normal range of motion. Neck supple.  Cardiovascular: Normal rate and regular rhythm.   Pulmonary/Chest: Effort normal and breath sounds normal.  Abdominal: Soft. Bowel sounds are normal. There is no tenderness. There is no rebound and no guarding.  Musculoskeletal: Normal range of motion.  Neurological: She is alert and oriented to person, place, and time. Coordination normal.  Speech is clear and focused. Coordination is without deficit. CN's 3-12 grossly intact.   Skin: Skin is warm and dry. No rash noted.  Psychiatric: She has a normal mood and affect.    ED Course  Procedures (including critical care time) Labs Review Labs Reviewed - No data to display  Imaging Review No results found.   EKG Interpretation None  MDM   Final diagnoses:  None    1. Migraine headache  No neurological deficits in a patient with migraine history presenting with symptoms typical for her migraine. Pain is improved. Nausea is resolved. Stable for discharge.     Dewaine Oats, PA-C 09/04/13 1556

## 2013-08-31 NOTE — Discharge Instructions (Signed)

## 2013-09-05 NOTE — ED Provider Notes (Signed)
Medical screening examination/treatment/procedure(s) were performed by non-physician practitioner and as supervising physician I was immediately available for consultation/collaboration.   EKG Interpretation None        Debby Freiberg, MD 09/05/13 [phone]

## 2013-09-19 ENCOUNTER — Emergency Department (HOSPITAL_COMMUNITY): Payer: Worker's Compensation

## 2013-09-19 ENCOUNTER — Encounter (HOSPITAL_COMMUNITY): Payer: Self-pay | Admitting: Emergency Medicine

## 2013-09-19 ENCOUNTER — Emergency Department (HOSPITAL_COMMUNITY)
Admission: EM | Admit: 2013-09-19 | Discharge: 2013-09-20 | Disposition: A | Discharge status: Home / Self Care | Payer: Worker's Compensation | Attending: Emergency Medicine | Admitting: Emergency Medicine

## 2013-09-19 DIAGNOSIS — G43909 Migraine, unspecified, not intractable, without status migrainosus: Secondary | ICD-10-CM | POA: Insufficient documentation

## 2013-09-19 DIAGNOSIS — S99929A Unspecified injury of unspecified foot, initial encounter: Secondary | ICD-10-CM

## 2013-09-19 DIAGNOSIS — S8990XA Unspecified injury of unspecified lower leg, initial encounter: Secondary | ICD-10-CM | POA: Insufficient documentation

## 2013-09-19 DIAGNOSIS — S99919A Unspecified injury of unspecified ankle, initial encounter: Secondary | ICD-10-CM

## 2013-09-19 DIAGNOSIS — M25561 Pain in right knee: Secondary | ICD-10-CM

## 2013-09-19 DIAGNOSIS — G43809 Other migraine, not intractable, without status migrainosus: Secondary | ICD-10-CM

## 2013-09-19 DIAGNOSIS — Z79899 Other long term (current) drug therapy: Secondary | ICD-10-CM | POA: Insufficient documentation

## 2013-09-19 MED ORDER — SODIUM CHLORIDE 0.9 % IV BOLUS (SEPSIS)
1000.0000 mL | Freq: Once | INTRAVENOUS | Status: AC
  Administered 2013-09-19: 1000 mL via INTRAVENOUS

## 2013-09-19 MED ORDER — HYDROCODONE-ACETAMINOPHEN 5-325 MG PO TABS: 1.0000 | ORAL_TABLET | ORAL | Status: DC | PRN

## 2013-09-19 MED ORDER — PROMETHAZINE HCL 25 MG/ML IJ SOLN
25.0000 mg | Freq: Once | INTRAMUSCULAR | Status: AC
  Administered 2013-09-19: 25 mg via INTRAVENOUS
  Filled 2013-09-19: qty 1

## 2013-09-19 MED ORDER — HYDROCODONE-ACETAMINOPHEN 5-325 MG PO TABS: 1.0000 | ORAL_TABLET | Freq: Once | ORAL | Status: DC | PRN

## 2013-09-19 MED ORDER — HYDROCODONE-ACETAMINOPHEN 5-325 MG PO TABS
1.0000 | ORAL_TABLET | Freq: Once | ORAL | Status: AC
  Administered 2013-09-19: 1 via ORAL
  Filled 2013-09-19: qty 1

## 2013-09-19 MED ORDER — ONDANSETRON HCL 4 MG PO TABS: 4.0000 mg | ORAL_TABLET | Freq: Four times a day (QID) | ORAL | Status: DC

## 2013-09-19 MED ORDER — METOCLOPRAMIDE HCL 5 MG/ML IJ SOLN
10.0000 mg | Freq: Once | INTRAMUSCULAR | Status: AC
  Administered 2013-09-19: 10 mg via INTRAVENOUS
  Filled 2013-09-19: qty 2

## 2013-09-19 MED ORDER — KETOROLAC TROMETHAMINE 30 MG/ML IJ SOLN
30.0000 mg | Freq: Once | INTRAMUSCULAR | Status: AC
  Administered 2013-09-19: 30 mg via INTRAVENOUS
  Filled 2013-09-19: qty 1
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
## 2013-09-19 NOTE — Discharge Instructions (Signed)
Migraine Headache A migraine headache is an intense, throbbing pain on one or both sides of your head. A migraine can last for 30 minutes to several hours. CAUSES  The exact cause of a migraine headache is not always known. However, a migraine may be caused when nerves in the brain become irritated and release chemicals that cause inflammation. This causes pain. Certain things may also trigger migraines, such as:  Alcohol.  Smoking.  Stress.  Menstruation.  Aged cheeses.  Foods or drinks that contain nitrates, glutamate, aspartame, or tyramine.  Lack of sleep.  Chocolate.  Caffeine.  Hunger.  Physical exertion.  Fatigue.  Medicines used to treat chest pain (nitroglycerine), birth control pills, estrogen, and some blood pressure medicines. SIGNS AND SYMPTOMS  Pain on one or both sides of your head.  Pulsating or throbbing pain.  Severe pain that prevents daily activities.  Pain that is aggravated by any physical activity.  Nausea, vomiting, or both.  Dizziness.  Pain with exposure to bright lights, loud noises, or activity.  General sensitivity to bright lights, loud noises, or smells. Before you get a migraine, you may get warning signs that a migraine is coming (aura). An aura may include:  Seeing flashing lights.  Seeing bright spots, halos, or zigzag lines.  Having tunnel vision or blurred vision.  Having feelings of numbness or tingling.  Having trouble talking.  Having muscle weakness. DIAGNOSIS  A migraine headache is often diagnosed based on:  Symptoms.  Physical exam.  A CT scan or MRI of your head. These imaging tests cannot diagnose migraines, but they can help rule out other causes of headaches. TREATMENT Medicines may be given for pain and nausea. Medicines can also be given to help prevent recurrent migraines.  HOME CARE INSTRUCTIONS  Only take over-the-counter or prescription medicines for pain or discomfort as directed by your  health care provider. The use of long-term narcotics is not recommended.  Lie down in a dark, quiet room when you have a migraine.  Keep a journal to find out what may trigger your migraine headaches. For example, write down:  What you eat and drink.  How much sleep you get.  Any change to your diet or medicines.  Limit alcohol consumption.  Quit smoking if you smoke.  Get 7-9 hours of sleep, or as recommended by your health care provider.  Limit stress.  Keep lights dim if bright lights bother you and make your migraines worse. SEEK IMMEDIATE MEDICAL CARE IF:   Your migraine becomes severe.  You have a fever.  You have a stiff neck.  You have vision loss.  You have muscular weakness or loss of muscle control.  You start losing your balance or have trouble walking.  You feel faint or pass out.  You have severe symptoms that are different from your first symptoms. MAKE SURE YOU:   Understand these instructions.  Will watch your condition.  Will get help right away if you are not doing well or get worse. Document Released: 12/24/2004 Document Revised: 05/10/2013 Document Reviewed: 08/31/2012 [facility] Patient Information 2015 [place], [place]. This information is not intended to replace advice given to you by your health care provider. Make sure you discuss any questions you have with your health care provider. Arthritis, Nonspecific Arthritis is inflammation of a joint. This usually means pain, redness, warmth or swelling are present. One or more joints may be involved. There are a number of types of arthritis. Your caregiver may not be able to  tell what type of arthritis you have right away. CAUSES  The most common cause of arthritis is the wear and tear on the joint (osteoarthritis). This causes damage to the cartilage, which can break down over time. The knees, hips, back and neck are most often affected by this type of arthritis. Other types of arthritis and common  causes of joint pain include:  Sprains and other injuries near the joint. Sometimes minor sprains and injuries cause pain and swelling that develop hours later.  Rheumatoid arthritis. This affects hands, feet and knees. It usually affects both sides of your body at the same time. It is often associated with chronic ailments, fever, weight loss and general weakness.  Crystal arthritis. Gout and pseudo gout can cause occasional acute severe pain, redness and swelling in the foot, ankle, or knee.  Infectious arthritis. Bacteria can get into a joint through a break in overlying skin. This can cause infection of the joint. Bacteria and viruses can also spread through the blood and affect your joints.  Drug, infectious and allergy reactions. Sometimes joints can become mildly painful and slightly swollen with these types of illnesses. SYMPTOMS   Pain is the main symptom.  Your joint or joints can also be red, swollen and warm or hot to the touch.  You may have a fever with certain types of arthritis, or even feel overall ill.  The joint with arthritis will hurt with movement. Stiffness is present with some types of arthritis. DIAGNOSIS  Your caregiver will suspect arthritis based on your description of your symptoms and on your exam. Testing may be needed to find the type of arthritis:  Blood and sometimes urine tests.  X-ray tests and sometimes CT or MRI scans.  Removal of fluid from the joint (arthrocentesis) is done to check for bacteria, crystals or other causes. Your caregiver (or a specialist) will numb the area over the joint with a local anesthetic, and use a needle to remove joint fluid for examination. This procedure is only minimally uncomfortable.  Even with these tests, your caregiver may not be able to tell what kind of arthritis you have. Consultation with a specialist (rheumatologist) may be helpful. TREATMENT  Your caregiver will discuss with you treatment specific to your  type of arthritis. If the specific type cannot be determined, then the following general recommendations may apply. Treatment of severe joint pain includes:  Rest.  Elevation.  Anti-inflammatory medication (for example, ibuprofen) may be prescribed. Avoiding activities that cause increased pain.  Only take over-the-counter or prescription medicines for pain and discomfort as recommended by your caregiver.  Cold packs over an inflamed joint may be used for 10 to 15 minutes every hour. Hot packs sometimes feel better, but do not use overnight. Do not use hot packs if you are diabetic without your caregiver's permission.  A cortisone shot into arthritic joints may help reduce pain and swelling.  Any acute arthritis that gets worse over the next 1 to 2 days needs to be looked at to be sure there is no joint infection. Long-term arthritis treatment involves modifying activities and lifestyle to reduce joint stress jarring. This can include weight loss. Also, exercise is needed to nourish the joint cartilage and remove waste. This helps keep the muscles around the joint strong. HOME CARE INSTRUCTIONS   Do not take aspirin to relieve pain if gout is suspected. This elevates uric acid levels.  Only take over-the-counter or prescription medicines for pain, discomfort or fever as directed  by your caregiver.  Rest the joint as much as possible.  If your joint is swollen, keep it elevated.  Use crutches if the painful joint is in your leg.  Drinking plenty of fluids may help for certain types of arthritis.  Follow your caregiver's dietary instructions.  Try low-impact exercise such as:  Swimming.  Water aerobics.  Biking.  Walking.  Morning stiffness is often relieved by a warm shower.  Put your joints through regular range-of-motion. SEEK MEDICAL CARE IF:   You do not feel better in 24 hours or are getting worse.  You have side effects to medications, or are not getting better  with treatment. SEEK IMMEDIATE MEDICAL CARE IF:   You have a fever.  You develop severe joint pain, swelling or redness.  Many joints are involved and become painful and swollen.  There is severe back pain and/or leg weakness.  You have loss of bowel or bladder control. Document Released: 02/01/2004 Document Revised: 03/18/2011 Document Reviewed: 02/17/2008 [facility] Patient Information 2015 [place], [place]. This information is not intended to replace advice given to you by your health care provider. Make sure you discuss any questions you have with your health care provider.

## 2013-09-22 NOTE — ED Provider Notes (Signed)
Medical screening examination/treatment/procedure(s) were performed by non-physician practitioner and as supervising physician I was immediately available for consultation/collaboration.   EKG Interpretation  Date/Time:  Sunday September 19 2013 19:16:16 EDT Ventricular Rate:  88 PR Interval:  176 QRS Duration: 76 QT Interval:  339 QTC Calculation: 410 R Axis:   71 Text Interpretation:  Sinus rhythm Ventricular premature complex , new since last tracing Baseline wander in lead(s) V2 Confirmed by Ailene Royal  MD-J, Aviya Jarvie ([id number]) on 09/19/2013 7:25:04 PM        Dorie Rank, MD 09/22/13 [phone]

## 2013-12-17 ENCOUNTER — Other Ambulatory Visit: Payer: Self-pay | Admitting: Family Medicine

## 2013-12-17 ENCOUNTER — Ambulatory Visit
Admission: RE | Admit: 2013-12-17 | Discharge: 2013-12-17 | Disposition: A | Discharge status: Home / Self Care | Payer: Medicaid Other | Source: Ambulatory Visit | Attending: Family Medicine | Admitting: Family Medicine

## 2013-12-17 DIAGNOSIS — M542 Cervicalgia: Secondary | ICD-10-CM

## 2015-01-03 ENCOUNTER — Emergency Department (HOSPITAL_COMMUNITY): Payer: Medicaid Other

## 2015-01-03 ENCOUNTER — Emergency Department (HOSPITAL_COMMUNITY)
Admission: EM | Admit: 2015-01-03 | Discharge: 2015-01-03 | Disposition: A | Discharge status: Home / Self Care | Payer: Medicaid Other | Attending: Physician Assistant | Admitting: Physician Assistant

## 2015-01-03 ENCOUNTER — Encounter (HOSPITAL_COMMUNITY): Payer: Self-pay | Admitting: Emergency Medicine

## 2015-01-03 DIAGNOSIS — G43909 Migraine, unspecified, not intractable, without status migrainosus: Secondary | ICD-10-CM

## 2015-01-03 DIAGNOSIS — J0100 Acute maxillary sinusitis, unspecified: Secondary | ICD-10-CM | POA: Diagnosis not present

## 2015-01-03 DIAGNOSIS — Z79899 Other long term (current) drug therapy: Secondary | ICD-10-CM | POA: Insufficient documentation

## 2015-01-03 DIAGNOSIS — R05 Cough: Secondary | ICD-10-CM | POA: Diagnosis present

## 2015-01-03 DIAGNOSIS — J45909 Unspecified asthma, uncomplicated: Secondary | ICD-10-CM | POA: Insufficient documentation

## 2015-01-03 HISTORY — DX: Unspecified asthma, uncomplicated: J45.909

## 2015-01-03 MED ORDER — SODIUM CHLORIDE 0.9 % IV BOLUS (SEPSIS)
1000.0000 mL | Freq: Once | INTRAVENOUS | Status: AC
  Administered 2015-01-03: 1000 mL via INTRAVENOUS

## 2015-01-03 MED ORDER — KETOROLAC TROMETHAMINE 30 MG/ML IJ SOLN
30.0000 mg | Freq: Once | INTRAMUSCULAR | Status: AC
  Administered 2015-01-03: 30 mg via INTRAVENOUS
  Filled 2015-01-03: qty 1

## 2015-01-03 MED ORDER — DEXAMETHASONE SODIUM PHOSPHATE 10 MG/ML IJ SOLN
10.0000 mg | Freq: Once | INTRAMUSCULAR | Status: AC
  Administered 2015-01-03: 10 mg via INTRAVENOUS
  Filled 2015-01-03: qty 1

## 2015-01-03 MED ORDER — AMOXICILLIN-POT CLAVULANATE 875-125 MG PO TABS: 1.0000 | ORAL_TABLET | Freq: Two times a day (BID) | ORAL | Status: DC

## 2015-01-03 MED ORDER — METOCLOPRAMIDE HCL 5 MG/ML IJ SOLN
10.0000 mg | Freq: Once | INTRAMUSCULAR | Status: AC
  Administered 2015-01-03: 10 mg via INTRAVENOUS
  Filled 2015-01-03: qty 2

## 2015-01-03 MED ORDER — GUAIFENESIN-CODEINE 100-10 MG/5ML PO SOLN: 10.0000 mL | Freq: Four times a day (QID) | ORAL | Status: DC | PRN

## 2015-01-03 MED ORDER — DIPHENHYDRAMINE HCL 50 MG/ML IJ SOLN
12.5000 mg | Freq: Once | INTRAMUSCULAR | Status: AC
  Administered 2015-01-03: 12.5 mg via INTRAVENOUS
  Filled 2015-01-03: qty 1

## 2015-01-03 NOTE — ED Notes (Signed)
Pt reports cough and runny nose for the past 2 weeks which has caused CP and SOB. Pt coughing in triage yet able to speak in full sentences. Pt also has had a migraine for the past 2 days.

## 2015-01-03 NOTE — Discharge Instructions (Signed)
1. Medications: Cough syrup - This can make you very drowsy - please do not drink or drive on this medication; take abx as directed until completion, continue usual home medications 2. Treatment: rest, drink plenty of fluids, tylenol or ibuprofen for fever.  3. Follow Up: Please follow up with your primary doctor in 3 days for discussion of your diagnoses and further evaluation after today's visit; Please return to the ER for any new or worsening symptoms, any additional concerns.

## 2015-01-03 NOTE — ED Provider Notes (Signed)
CSN: [id number]     Arrival date & time 01/03/15  1557 History   First MD Initiated Contact with Patient 01/03/15 2043     Chief Complaint  Patient presents with  . Cough  . Migraine     (Consider location/radiation/quality/duration/timing/severity/associated sxs/prior Treatment) Patient is a 54 y.o. female presenting with cough and migraines. The history is provided by the patient and medical records. No language interpreter was used.  Cough Associated symptoms: headaches   Associated symptoms: no myalgias, no rash, no shortness of breath, no sore throat and no wheezing   Migraine Associated symptoms include congestion, coughing, headaches, nausea and vomiting. Pertinent negatives include no abdominal pain, arthralgias, myalgias, neck pain, rash, sore throat or weakness.   Audrey Weeks is a 54 y.o. female  with a PMH of migraines and asthma who presents to the Emergency Department complaining of persistent cough x 2 weeks. Patient states she was seen by her PCP at onset and placed on z-pack. Cough/congestion has not improved. Admits to worsening maxillary sinus pressure. Patient also complaining of 2 day hx of headache c/w typical migraine headache. She has a hx of migraines, states she is out of home migraine medication.  Admits to nausea, vomiting. Denies fever, photophobia.   Past Medical History  Diagnosis Date  . Migraines   . Asthma    Past Surgical History  Procedure Laterality Date  . Cesarean section     Family History  Problem Relation Age of Onset  . Hypertension Other   . Diabetes Other    Social History  Substance Use Topics  . Smoking status: Never Smoker   . Smokeless tobacco: Never Used  . Alcohol Use: No     Comment: occ   OB History    No data available     Review of Systems  Constitutional: Negative.   HENT: Positive for congestion, postnasal drip and sinus pressure. Negative for sore throat.   Eyes: Negative for visual disturbance.   Respiratory: Positive for cough and chest tightness. Negative for shortness of breath and wheezing.   Cardiovascular: Negative.   Gastrointestinal: Positive for nausea and vomiting. Negative for abdominal pain, diarrhea and constipation.  Musculoskeletal: Negative for myalgias, back pain, arthralgias and neck pain.  Skin: Negative for rash.  Allergic/Immunologic: Negative for immunocompromised state.  Neurological: Positive for headaches. Negative for dizziness and weakness.      Allergies  Review of patient's allergies indicates no known allergies.  Home Medications   Prior to Admission medications   Medication Sig Start Date End Date Taking? Authorizing Provider  gabapentin (NEURONTIN) 300 MG capsule Take 300 mg by mouth 3 (three) times daily. 12/15/14  Yes Historical Provider, MD  amoxicillin-clavulanate (AUGMENTIN) 875-125 MG tablet Take 1 tablet by mouth every 12 (twelve) hours. 01/03/15   Ozella Almond Ward, PA-C  Biotin 1000 MCG tablet Take 1,000 mcg by mouth daily.    Historical Provider, MD  buPROPion (WELLBUTRIN SR) 150 MG 12 hr tablet Take 150 mg by mouth 2 (two) times daily.    Historical Provider, MD  clonazePAM (KLONOPIN) 0.5 MG tablet Take 0.5 mg by mouth 3 (three) times daily as needed. For anxiety.    Historical Provider, MD  guaiFENesin-codeine 100-10 MG/5ML syrup Take 10 mLs by mouth every 6 (six) hours as needed for cough. 01/03/15   Ozella Almond Ward, PA-C  HYDROcodone-acetaminophen (NORCO/VICODIN) 5-325 MG per tablet Take 1-2 tablets by mouth every 4 (four) hours as needed for moderate pain or severe pain. Patient  not taking: Reported on 01/03/2015 09/19/13   Delos Haring, PA-C  ondansetron (ZOFRAN) 4 MG tablet Take 1 tablet (4 mg total) by mouth every 6 (six) hours. Patient not taking: Reported on 01/03/2015 09/19/13   Delos Haring, PA-C  promethazine (PHENERGAN) 25 MG tablet Take 1 tablet (25 mg total) by mouth every 6 (six) hours as needed for nausea or  vomiting. Patient not taking: Reported on 01/03/2015 08/31/13   Charlann Lange, PA-C  traZODone (DESYREL) 150 MG tablet Take 200 mg by mouth at bedtime.     Historical Provider, MD   BP 139/80 mmHg  Pulse 90  Temp(Src) 98.1 F (36.7 C) (Oral)  Resp 20  SpO2 100%  LMP 12/08/2011 Physical Exam  Constitutional: She is oriented to person, place, and time. She appears well-developed and well-nourished. No distress.  HENT:  Head: Normocephalic and atraumatic.  Nose: Right sinus exhibits maxillary sinus tenderness. Right sinus exhibits no frontal sinus tenderness. Left sinus exhibits maxillary sinus tenderness. Left sinus exhibits no frontal sinus tenderness.  No tenderness of the temporal artery  OP with mild erythema, no exudates  Eyes: Conjunctivae and EOM are normal. Pupils are equal, round, and reactive to light. No scleral icterus.  No nystagmus   Neck: Normal range of motion. Neck supple.  Full active and passive ROM without pain.  No midline or paraspinal tenderness. No nuchal rigidity or meningeal signs.  Cardiovascular: Normal rate, regular rhythm, normal heart sounds and intact distal pulses.  Exam reveals no gallop and no friction rub.   No murmur heard. Pulmonary/Chest: Effort normal and breath sounds normal. No respiratory distress. She has no wheezes. She has no rales. She exhibits tenderness.  Abdominal: Soft. Bowel sounds are normal. She exhibits no distension. There is no tenderness. There is no rebound and no guarding.  Musculoskeletal: Normal range of motion.  Lymphadenopathy:    She has no cervical adenopathy.  Neurological: She is alert and oriented to person, place, and time. She has normal reflexes. No cranial nerve deficit. Coordination normal.  Mental Status: Alert, oriented, and thought content is appropriate. Speech is fluent without evidence of aphasia. Able to follow two-step commands without difficulty.  Cranial Nerves:  II - Peripheral visual fields grossly  normal, pupils equal, round, reactive to light III, IV, VI - Bilateral EOM intact, no ptosis V - Facial light touch sensation intact and equal VII - Facial symmetry: smile, raised eyebrows ; Eyelids kept closed against resistance VIII - Hearing grossly normal bilaterally  IX, X - Uvula midline XI - Bilateral shoulder shrug equal and strong XII - Tongue extension midline Motor:  5/5 muscle strength of upper and lower extremities bilaterally including strong and equal grip strength and plantar/dorsiflexion.  Sensory:  Light touch sensory intact.   Skin: Skin is warm and dry. No rash noted. She is not diaphoretic.  Psychiatric: She has a normal mood and affect. Her behavior is normal. Judgment and thought content normal.  Nursing note and vitals reviewed.   ED Course  Procedures (including critical care time) Labs Review Labs Reviewed - No data to display  Imaging Review Dg Chest 2 View  01/03/2015  CLINICAL DATA:  Productive cough and shortness of breath. EXAM: CHEST  2 VIEW COMPARISON:  12/15/2011 FINDINGS: Heart size is normal. There is chronic prominence in the left main pulmonary artery, stable. The lungs are clear. No effusions. No acute osseous abnormality. IMPRESSION: No acute abnormality. Electronically Signed   By: Lorriane Shire M.D.   On: 01/03/2015  16:49   I have personally reviewed and evaluated these images and lab results as part of my medical decision-making.   EKG Interpretation None      MDM   Final diagnoses:  Acute maxillary sinusitis, recurrence not specified  Migraine without status migrainosus, not intractable, unspecified migraine type   Audrey Weeks presents with worsening cough/sinus pressure x 2 weeks and migraine headache x 2 days.  Imaging: CXR shows no acute cardiopulm dz  Therapeutics: Reglan, Benadryl, Toradol, 1L fluids, Decadron  Patient re-evaluated and headache feels much improved. Still coughing  A&P: Sinusitis   - Augmentin   -  PCP follow up Cough - cough syrup given for symptomatic relief Migraine - PCP follow up  Return precautions, home care instructions given.     Ozella Almond Ward, PA-C 01/03/15 2257  Courteney Julio Alm, MD 01/03/15 [phone]

## 2015-04-10 ENCOUNTER — Encounter (HOSPITAL_COMMUNITY): Payer: Self-pay | Admitting: Emergency Medicine

## 2015-04-10 ENCOUNTER — Emergency Department (HOSPITAL_COMMUNITY): Payer: Medicaid Other

## 2015-04-10 ENCOUNTER — Emergency Department (HOSPITAL_COMMUNITY)
Admission: EM | Admit: 2015-04-10 | Discharge: 2015-04-10 | Disposition: A | Discharge status: Home / Self Care | Payer: Medicaid Other | Attending: Emergency Medicine | Admitting: Emergency Medicine

## 2015-04-10 DIAGNOSIS — X500XXA Overexertion from strenuous movement or load, initial encounter: Secondary | ICD-10-CM | POA: Diagnosis not present

## 2015-04-10 DIAGNOSIS — Z792 Long term (current) use of antibiotics: Secondary | ICD-10-CM | POA: Diagnosis not present

## 2015-04-10 DIAGNOSIS — S46002A Unspecified injury of muscle(s) and tendon(s) of the rotator cuff of left shoulder, initial encounter: Secondary | ICD-10-CM | POA: Insufficient documentation

## 2015-04-10 DIAGNOSIS — Y998 Other external cause status: Secondary | ICD-10-CM | POA: Insufficient documentation

## 2015-04-10 DIAGNOSIS — Y9389 Activity, other specified: Secondary | ICD-10-CM | POA: Diagnosis not present

## 2015-04-10 DIAGNOSIS — S4992XA Unspecified injury of left shoulder and upper arm, initial encounter: Secondary | ICD-10-CM | POA: Diagnosis present

## 2015-04-10 DIAGNOSIS — G43909 Migraine, unspecified, not intractable, without status migrainosus: Secondary | ICD-10-CM | POA: Insufficient documentation

## 2015-04-10 DIAGNOSIS — J45909 Unspecified asthma, uncomplicated: Secondary | ICD-10-CM | POA: Diagnosis not present

## 2015-04-10 DIAGNOSIS — Z79899 Other long term (current) drug therapy: Secondary | ICD-10-CM | POA: Diagnosis not present

## 2015-04-10 DIAGNOSIS — Y9289 Other specified places as the place of occurrence of the external cause: Secondary | ICD-10-CM | POA: Insufficient documentation

## 2015-04-10 DIAGNOSIS — M25512 Pain in left shoulder: Secondary | ICD-10-CM

## 2015-04-10 MED ORDER — KETOROLAC TROMETHAMINE 60 MG/2ML IM SOLN
60.0000 mg | Freq: Once | INTRAMUSCULAR | Status: AC
  Administered 2015-04-10: 60 mg via INTRAMUSCULAR
  Filled 2015-04-10: qty 2

## 2015-04-10 MED ORDER — DIAZEPAM 5 MG PO TABS
5.0000 mg | ORAL_TABLET | Freq: Once | ORAL | Status: AC
  Administered 2015-04-10: 5 mg via ORAL
  Filled 2015-04-10: qty 1

## 2015-04-10 MED ORDER — DIAZEPAM 5 MG PO TABS: 5.0000 mg | ORAL_TABLET | Freq: Two times a day (BID) | ORAL | Status: DC | PRN

## 2015-04-10 MED ORDER — MELOXICAM 7.5 MG PO TABS: 15.0000 mg | ORAL_TABLET | Freq: Every day | ORAL | Status: DC

## 2015-04-10 MED ORDER — HYDROMORPHONE HCL 1 MG/ML IJ SOLN
1.0000 mg | Freq: Once | INTRAMUSCULAR | Status: AC
  Administered 2015-04-10: 1 mg via INTRAMUSCULAR
  Filled 2015-04-10: qty 1

## 2015-04-10 MED ORDER — OXYCODONE-ACETAMINOPHEN 5-325 MG PO TABS: 1.0000 | ORAL_TABLET | Freq: Four times a day (QID) | ORAL | Status: DC | PRN

## 2015-04-10 NOTE — ED Notes (Signed)
Pt reports understanding of discharge information. No questions at time of discharge 

## 2015-04-10 NOTE — ED Notes (Signed)
Patient transported to X-ray 

## 2015-04-10 NOTE — Discharge Instructions (Signed)
We recommend frequent stretching of your L shoulder. Ice your shoulder 3-4 times per day for 15-20 minutes each time. Take Mobic and Valium as prescribed. You may take Percocet as needed for pain. Follow up with your orthopedist in 1 week.  Adhesive Capsulitis Adhesive capsulitis is inflammation of the tendons and ligaments that surround the shoulder joint (shoulder capsule). This condition causes the shoulder to become stiff and painful to move. Adhesive capsulitis is also called frozen shoulder. CAUSES This condition may be caused by:  An injury to the shoulder joint.  Straining the shoulder.  Not moving the shoulder for a period of time. This can happen if your arm was injured or in a sling.  Long-standing health problems, such as:  Diabetes.  Thyroid problems.  Heart disease.  Stroke.  Rheumatoid arthritis.  Lung disease. In some cases, the cause may not be known. RISK FACTORS This condition is more likely to develop in:  Women.  People who are older than 56 years of age. SYMPTOMS Symptoms of this condition include:  Pain in the shoulder when moving the arm. There may also be pain when parts of the shoulder are touched. The pain is worse at night or when at rest.  Soreness or aching in the shoulder.  Inability to move the shoulder normally.  Muscle spasms. DIAGNOSIS This condition is diagnosed with a physical exam and imaging tests, such as an X-ray or MRI. TREATMENT This condition may be treated with:  Treatment of the underlying cause or condition.  Physical therapy. This involves performing exercises to get the shoulder moving again.  Medicine. Medicine may be given to relieve pain, inflammation, or muscle spasms.  Steroid injections into the shoulder joint.  Shoulder manipulation. This is a procedure to move the shoulder into another position. It is done after you are given a medicine to make you fall asleep (general anesthetic). The joint may also be  injected with salt water at high pressure to break down scarring.  Surgery. This may be done in severe cases when other treatments have failed. Although most people recover completely from adhesive capsulitis, some may not regain the full movement of the shoulder. HOME CARE INSTRUCTIONS  Take over-the-counter and prescription medicines only as told by your health care provider.  If you are being treated with physical therapy, follow instructions from your physical therapist.  Avoid exercises that put a lot of demand on your shoulder, such as throwing. These exercises can make pain worse.  If directed, apply ice to the injured area:  Put ice in a plastic bag.  Place a towel between your skin and the bag.  Leave the ice on for 20 minutes, 2-3 times per day. SEEK MEDICAL CARE IF:  You develop new symptoms.  Your symptoms get worse.   This information is not intended to replace advice given to you by your health care provider. Make sure you discuss any questions you have with your health care provider.   Document Released: 10/21/2008 Document Revised: 09/14/2014 Document Reviewed: 04/18/2014 Elsevier Interactive Patient Education [employer].

## 2015-04-10 NOTE — ED Notes (Signed)
Pt reports having pain in left shoulder that started appx 2.5 week ago after lifting weights. Pt reports pain shoots down arm worse with movement.

## 2015-04-10 NOTE — ED Provider Notes (Signed)
CSN: [id number]     Arrival date & time 04/10/15  2009 History  By signing my name below, I, Hansel Feinstein, attest that this documentation has been prepared under the direction and in the presence of [employer], PA-C. Electronically Signed: Hansel Feinstein, ED Scribe. 04/10/2015. 8:34 PM.    Chief Complaint  Patient presents with  . Shoulder Pain     The history is provided by the patient. No language interpreter was used.     HPI Comments: Audrey Weeks is a 55 y.o. female who presents to the Emergency Department complaining of moderate dorsal left shoulder pain initially onset 2 weeks ago and worsened 2 days ago. Pt states that she was lifting heavy weights prior to the onset of her pain and felt a twisting sensation when putting the barbell away, but her arm pain began days later. Denies falls or trauma. She reports that her pain is worsened with shoulder movement. Pt states she took flexeril and gabapentin yesterday with no relief of pain. She notes that she did not try ibuprofen. Pt is followed by Dr. Nelva Bush at [facility]. Denies numbness, paresthesia, additional injuries or complaints.    Past Medical History  Diagnosis Date  . Migraines   . Asthma    Past Surgical History  Procedure Laterality Date  . Cesarean section     Family History  Problem Relation Age of Onset  . Hypertension Other   . Diabetes Other    Social History  Substance Use Topics  . Smoking status: Never Smoker   . Smokeless tobacco: Never Used  . Alcohol Use: No     Comment: occ   OB History    No data available      Review of Systems  Musculoskeletal: Positive for arthralgias.  Neurological: Negative for numbness.  All other systems reviewed and are negative.   Allergies  Review of patient's allergies indicates no known allergies.  Home Medications   Prior to Admission medications   Medication Sig Start Date End Date Taking? Authorizing Provider  amoxicillin-clavulanate  (AUGMENTIN) 875-125 MG tablet Take 1 tablet by mouth every 12 (twelve) hours. 01/03/15   Ozella Almond Ward, PA-C  Biotin 1000 MCG tablet Take 1,000 mcg by mouth daily.    Historical Provider, MD  buPROPion (WELLBUTRIN SR) 150 MG 12 hr tablet Take 150 mg by mouth 2 (two) times daily.    Historical Provider, MD  clonazePAM (KLONOPIN) 0.5 MG tablet Take 0.5 mg by mouth 3 (three) times daily as needed. For anxiety.    Historical Provider, MD  diazepam (VALIUM) 5 MG tablet Take 1 tablet (5 mg total) by mouth every 12 (twelve) hours as needed for muscle spasms. 04/10/15   Antonietta Breach, PA-C  gabapentin (NEURONTIN) 300 MG capsule Take 300 mg by mouth 3 (three) times daily. 12/15/14   Historical Provider, MD  guaiFENesin-codeine 100-10 MG/5ML syrup Take 10 mLs by mouth every 6 (six) hours as needed for cough. 01/03/15   Ozella Almond Ward, PA-C  meloxicam (MOBIC) 7.5 MG tablet Take 2 tablets (15 mg total) by mouth daily. 04/10/15   Antonietta Breach, PA-C  ondansetron (ZOFRAN) 4 MG tablet Take 1 tablet (4 mg total) by mouth every 6 (six) hours. Patient not taking: Reported on 01/03/2015 09/19/13   Delos Haring, PA-C  oxyCODONE-acetaminophen (PERCOCET/ROXICET) 5-325 MG tablet Take 1-2 tablets by mouth every 6 (six) hours as needed for severe pain. 04/10/15   Antonietta Breach, PA-C  promethazine (PHENERGAN) 25 MG tablet Take 1  tablet (25 mg total) by mouth every 6 (six) hours as needed for nausea or vomiting. Patient not taking: Reported on 01/03/2015 08/31/13   Charlann Lange, PA-C  traZODone (DESYREL) 150 MG tablet Take 200 mg by mouth at bedtime.     Historical Provider, MD   BP 128/93 mmHg  Pulse 86  Resp 17  Ht 5\' 2"  (1.575 m)  Wt 105.235 kg  BMI 42.42 kg/m2  SpO2 100%  LMP 12/08/2011   Physical Exam  Constitutional: She is oriented to person, place, and time. She appears well-developed and well-nourished. No distress.  HENT:  Head: Normocephalic and atraumatic.  Eyes: Conjunctivae and EOM are normal. No scleral  icterus.  Neck: Normal range of motion.  Cardiovascular: Normal rate, regular rhythm and intact distal pulses.   Distal radial pulse 2+ in the LUE  Pulmonary/Chest: Effort normal. No respiratory distress.  Musculoskeletal:       Left shoulder: She exhibits decreased range of motion (decreased AROM of the L shoulder), tenderness and pain. She exhibits no swelling, no effusion, no crepitus, no deformity, no spasm and normal pulse.       Arms: Neurological: She is alert and oriented to person, place, and time. She exhibits normal muscle tone. Coordination normal.  Sensation to light touch intact in the LUE. Patient reports subjective decreased sensation in the 1st-3rd digits of the L hand. Grip strength 4/5, appreciated to be secondary to poor effort as strength improves with coaxing.  Skin: Skin is warm and dry. No rash noted. She is not diaphoretic. No erythema. No pallor.  Psychiatric: She has a normal mood and affect. Her behavior is normal.  Nursing note and vitals reviewed.   ED Course  Procedures (including critical care time)  DIAGNOSTIC STUDIES: Oxygen Saturation is 100% on RA, normal by my interpretation.    COORDINATION OF CARE: 8:33 PM Discussed treatment plan with pt at bedside which includes XR and pt agreed to plan.   Imaging Review Dg Shoulder Left  04/10/2015  CLINICAL DATA:  Pain with movement. EXAM: LEFT SHOULDER - 2+ VIEW COMPARISON:  None. FINDINGS: Frontal, Y scapular, and axillary images were obtained. There is no acute fracture or dislocation. There is calcification lateral to the lateral aspect of the humeral head. There is mild generalized joint space narrowing. No erosive change. IMPRESSION: Calcific tendinosis laterally. Mild generalized joint space narrowing. No fracture or dislocation. Electronically Signed   By: Lowella Grip III M.D.   On: 04/10/2015 20:50     I have personally reviewed and evaluated these images as part of my medical  decision-making.   MDM   Final diagnoses:  Shoulder pain, left  Rotator cuff injury, left, initial encounter    Patient presenting for acute on chronic L shoulder pain after lifting weights at the gym. She reports a discomfort dating back to ~4 weeks ago. Patient neurovascularly intact. Xray negative for fracture. There is mild joint space narrowing; no evidence of AC joint separation. Suspect adhesive capsulitis. Will refer to orthopedics for follow up. Return precautions discussed and provided. Patient discharged in good condition with no unaddressed concerns.  I personally performed the services described in this documentation, which was scribed in my presence. The recorded information has been reviewed and is accurate.    Filed Vitals:   04/10/15 2020  BP: 128/93  Pulse: 86  Resp: 17  Height: 5\' 2"  (1.575 m)  Weight: 105.235 kg  SpO2: 100%     Antonietta Breach, PA-C 04/10/15 2144  Forde Dandy, MD 04/11/15 [phone]

## 2015-09-05 ENCOUNTER — Encounter (HOSPITAL_COMMUNITY): Payer: Self-pay | Admitting: Emergency Medicine

## 2015-09-05 ENCOUNTER — Emergency Department (HOSPITAL_COMMUNITY)
Admission: EM | Admit: 2015-09-05 | Discharge: 2015-09-05 | Disposition: A | Discharge status: Home / Self Care | Payer: Medicaid Other | Attending: Emergency Medicine | Admitting: Emergency Medicine

## 2015-09-05 DIAGNOSIS — G43009 Migraine without aura, not intractable, without status migrainosus: Secondary | ICD-10-CM

## 2015-09-05 DIAGNOSIS — G43909 Migraine, unspecified, not intractable, without status migrainosus: Secondary | ICD-10-CM | POA: Diagnosis present

## 2015-09-05 DIAGNOSIS — R197 Diarrhea, unspecified: Secondary | ICD-10-CM

## 2015-09-05 DIAGNOSIS — J45909 Unspecified asthma, uncomplicated: Secondary | ICD-10-CM | POA: Insufficient documentation

## 2015-09-05 LAB — COMPREHENSIVE METABOLIC PANEL
ALBUMIN: 4 g/dL (ref 3.5–5.0)
ALT: 24 U/L (ref 14–54)
ANION GAP: 8 (ref 5–15)
AST: 21 U/L (ref 15–41)
Alkaline Phosphatase: 66 U/L (ref 38–126)
BUN: 7 mg/dL (ref 6–20)
CALCIUM: 9.6 mg/dL (ref 8.9–10.3)
CO2: 23 mmol/L (ref 22–32)
Chloride: 104 mmol/L (ref 101–111)
Creatinine, Ser: 0.69 mg/dL (ref 0.44–1.00)
GFR calc Af Amer: >60 mL/min (ref >60)
GFR calc non Af Amer: >60 mL/min (ref >60)
GLUCOSE: 110 mg/dL — AB (ref 65–99)
Potassium: 4 mmol/L (ref 3.5–5.1)
Sodium: 135 mmol/L (ref 135–145)
Total Bilirubin: 0.2 mg/dL — ABNORMAL LOW (ref 0.3–1.2)
Total Protein: 7.4 g/dL (ref 6.5–8.1)

## 2015-09-05 LAB — URINALYSIS, ROUTINE W REFLEX MICROSCOPIC
BILIRUBIN URINE: NEGATIVE
Glucose, UA: NEGATIVE mg/dL
Hgb urine dipstick: NEGATIVE
Ketones, ur: NEGATIVE mg/dL
Leukocytes, UA: NEGATIVE
NITRITE: NEGATIVE
PH: 7.5 (ref 5.0–8.0)
Protein, ur: NEGATIVE mg/dL
Specific Gravity, Urine: 1.013 (ref 1.005–1.030)

## 2015-09-05 LAB — CBC
HCT: 39.5 % (ref 36.0–46.0)
HEMOGLOBIN: 13.5 g/dL (ref 12.0–15.0)
MCH: 28.2 pg (ref 26.0–34.0)
MCHC: 34.2 g/dL (ref 30.0–36.0)
MCV: 82.6 fL (ref 78.0–100.0)
Platelets: 310 10*3/uL (ref 150–400)
RBC: 4.78 MIL/uL (ref 3.87–5.11)
RDW: 13.4 % (ref 11.5–15.5)
WBC: 5.9 10*3/uL (ref 4.0–10.5)

## 2015-09-05 LAB — LIPASE, BLOOD: LIPASE: 22 U/L (ref 11–51)

## 2015-09-05 MED ORDER — DIPHENHYDRAMINE HCL 50 MG/ML IJ SOLN
12.5000 mg | Freq: Once | INTRAMUSCULAR | Status: AC
  Administered 2015-09-05: 12.5 mg via INTRAVENOUS
  Filled 2015-09-05: qty 1

## 2015-09-05 MED ORDER — METOCLOPRAMIDE HCL 5 MG/ML IJ SOLN
10.0000 mg | Freq: Once | INTRAMUSCULAR | Status: AC
  Administered 2015-09-05: 10 mg via INTRAVENOUS
  Filled 2015-09-05: qty 2

## 2015-09-05 MED ORDER — KETOROLAC TROMETHAMINE 30 MG/ML IJ SOLN
30.0000 mg | Freq: Once | INTRAMUSCULAR | Status: AC
  Administered 2015-09-05: 30 mg via INTRAVENOUS
  Filled 2015-09-05: qty 1

## 2015-09-05 MED ORDER — LOPERAMIDE HCL 2 MG PO CAPS: 2.0000 mg | ORAL_CAPSULE | Freq: Four times a day (QID) | ORAL | 0 refills | Status: DC | PRN

## 2015-09-05 MED ORDER — KETAMINE HCL-SODIUM CHLORIDE 100-0.9 MG/10ML-% IV SOSY
0.3000 mg/kg | PREFILLED_SYRINGE | Freq: Once | INTRAVENOUS | Status: AC
  Administered 2015-09-05: 32 mg via INTRAVENOUS
  Filled 2015-09-05: qty 10

## 2015-09-05 MED ORDER — SODIUM CHLORIDE 0.9 % IV BOLUS (SEPSIS)
500.0000 mL | Freq: Once | INTRAVENOUS | Status: AC
  Administered 2015-09-05: 500 mL via INTRAVENOUS

## 2015-09-05 MED ORDER — NAPROXEN 500 MG PO TABS: 500.0000 mg | ORAL_TABLET | Freq: Two times a day (BID) | ORAL | 0 refills | Status: DC

## 2015-09-05 MED ORDER — DEXAMETHASONE SODIUM PHOSPHATE 10 MG/ML IJ SOLN
10.0000 mg | Freq: Once | INTRAMUSCULAR | Status: AC
  Administered 2015-09-05: 10 mg via INTRAVENOUS
  Filled 2015-09-05: qty 1

## 2015-09-05 MED ORDER — TRAMADOL HCL 50 MG PO TABS: 50.0000 mg | ORAL_TABLET | Freq: Four times a day (QID) | ORAL | 0 refills | Status: DC | PRN

## 2015-09-05 NOTE — ED Provider Notes (Signed)
PROGRESS NOTE                                                                                                                 This is a sign-out from  PA Dowless at shift change: Audrey Weeks is a 55 y.o. female presenting with headache, typical for her migraine exacerbations. There is been some difficulty in obtaining IV and patient recently got her medications, plan is to recheck for pain control, neurologic exam is unremarkable and patient is afebrile, no signs of acute intracranial process. Please refer to previous note for full HPI, ROS, PMH and PE.   Patient reevaluated after she has pain medication, states that her pain is really only very slightly decreased to 9 out of 10, patient concerned that this headache is typical for her chronic migraines. Neurologic exam is nonfocal, no meningeal signs.  This patient has been aggressively and appropriately treated with headache cocktail, will try subdissociative dose of ketamine.  Patient states her headache is improved to 6 out of 10, feels comfortable going home, advised her to follow closely with neurology.  Vitals:   09/05/15 1237 09/05/15 1240 09/05/15 1544 09/05/15 1706  BP: 101/88  115/76   Pulse: 91  86 87  Resp: 18  18   Temp: 98.2 F (36.8 C)     TempSrc: Oral     SpO2: 99%  99% 98%  Weight:  106.6 kg    Height:  5\' 2"  (1.575 m)      Medications  metoCLOPramide (REGLAN) injection 10 mg (10 mg Intravenous Given 09/05/15 1525)  diphenhydrAMINE (BENADRYL) injection 12.5 mg (12.5 mg Intravenous Given 09/05/15 1525)  dexamethasone (DECADRON) injection 10 mg (10 mg Intravenous Given 09/05/15 1525)  sodium chloride 0.9 % bolus 500 mL (0 mLs Intravenous Stopped 09/05/15 1734)  ketorolac (TORADOL) 30 MG/ML injection 30 mg (30 mg Intravenous Given 09/05/15 1556)  ketamine 100 mg in normal saline 10 mL (10mg /mL) syringe (32 mg Intravenous Given 09/05/15 1700)         Audrey Blitz, PA-C 09/05/15 2032    Milton Ferguson,  MD 09/09/15 [phone]

## 2015-09-05 NOTE — ED Notes (Signed)
Patient was alert, oriented and stable upon discharge. RN went over AVS and patient had no further questions.  

## 2015-09-05 NOTE — ED Notes (Signed)
IV team at bedside 

## 2015-09-05 NOTE — ED Notes (Signed)
NP at bedside.

## 2015-09-05 NOTE — ED Triage Notes (Signed)
Patient reports migraine x2 days with photosensitivity. Patient is able to rotate head from side to side and able to put chin to chest. Reports abdominal pain, nausea, vomiting, and diarrhea.

## 2015-09-05 NOTE — ED Notes (Signed)
IV attempt x 1 unsuccessful. Ultrasound IV being attempted at this time by another RN.

## 2015-09-05 NOTE — ED Notes (Signed)
PA notified that pt does not feel much better after meds.

## 2015-09-05 NOTE — ED Provider Notes (Signed)
[place] DEPT Provider Note   CSN: [id number] Arrival date & time: 09/05/15  1226     History   Chief Complaint Chief Complaint  Patient presents with  . Migraine  . Abdominal Pain    HPI Audrey Weeks is a 55 y.o. female with a past medical history of migraines who presents to the ED today complaining of diarrhea and headache. Patient states that 3 days ago she went to a cookout/barbecue. The following day patient woke up with cramping abdominal pain and diarrhea. Patient also reported 2 episodes of nonbloody, nonbilious emesis. Patient states the diarrhea has continued and she is now experiencing a left frontal migraine. Patient has history of migraines and reports this feels similar to her previous migraines. Patient endorses photophobia and phonophobia. She is instructed ibuprofen without relief. She denies any neck pain/stiffness, fevers, chills, rhinorrhea, otalgia, sore throat, melena or hematochezia, blurry vision.  HPI  Past Medical History:  Diagnosis Date  . Asthma   . Migraines     Patient Active Problem List   Diagnosis Date Noted  . OBESITY 05/10/2009  . ANXIETY 05/10/2009  . MIGRAINE HEADACHE 04/15/2009  . PNEUMONIA, COMMUNITY ACQUIRED, PNEUMOCOCCAL 04/15/2009  . ASTHMA 04/15/2009    Past Surgical History:  Procedure Laterality Date  . CESAREAN SECTION      OB History    No data available       Home Medications    Prior to Admission medications   Medication Sig Start Date End Date Taking? Authorizing Provider  amoxicillin-clavulanate (AUGMENTIN) 875-125 MG tablet Take 1 tablet by mouth every 12 (twelve) hours. 01/03/15   Ozella Almond Ward, PA-C  Biotin 1000 MCG tablet Take 1,000 mcg by mouth daily.    Historical Provider, MD  buPROPion (WELLBUTRIN SR) 150 MG 12 hr tablet Take 150 mg by mouth 2 (two) times daily.    Historical Provider, MD  clonazePAM (KLONOPIN) 0.5 MG tablet Take 0.5 mg by mouth 3 (three) times daily as needed. For  anxiety.    Historical Provider, MD  diazepam (VALIUM) 5 MG tablet Take 1 tablet (5 mg total) by mouth every 12 (twelve) hours as needed for muscle spasms. 04/10/15   Antonietta Breach, PA-C  gabapentin (NEURONTIN) 300 MG capsule Take 300 mg by mouth 3 (three) times daily. 12/15/14   Historical Provider, MD  guaiFENesin-codeine 100-10 MG/5ML syrup Take 10 mLs by mouth every 6 (six) hours as needed for cough. 01/03/15   Ozella Almond Ward, PA-C  meloxicam (MOBIC) 7.5 MG tablet Take 2 tablets (15 mg total) by mouth daily. 04/10/15   Antonietta Breach, PA-C  ondansetron (ZOFRAN) 4 MG tablet Take 1 tablet (4 mg total) by mouth every 6 (six) hours. Patient not taking: Reported on 01/03/2015 09/19/13   Delos Haring, PA-C  oxyCODONE-acetaminophen (PERCOCET/ROXICET) 5-325 MG tablet Take 1-2 tablets by mouth every 6 (six) hours as needed for severe pain. 04/10/15   Antonietta Breach, PA-C  promethazine (PHENERGAN) 25 MG tablet Take 1 tablet (25 mg total) by mouth every 6 (six) hours as needed for nausea or vomiting. Patient not taking: Reported on 01/03/2015 08/31/13   Charlann Lange, PA-C  traZODone (DESYREL) 150 MG tablet Take 200 mg by mouth at bedtime.     Historical Provider, MD    Family History Family History  Problem Relation Age of Onset  . Hypertension Other   . Diabetes Other     Social History Social History  Substance Use Topics  . Smoking status: Never Smoker  .  Smokeless tobacco: Never Used  . Alcohol use No     Comment: occ     Allergies   Review of patient's allergies indicates no known allergies.   Review of Systems Review of Systems  All other systems reviewed and are negative.    Physical Exam Updated Vital Signs BP 101/88 (BP Location: Left Arm)   Pulse 91   Temp 98.2 F (36.8 C) (Oral)   Resp 18   Ht 5\' 2"  (1.575 m)   Wt 106.6 kg   LMP 12/08/2011   SpO2 99%   BMI 42.98 kg/m   Physical Exam  Constitutional: She is oriented to person, place, and time. She appears  well-developed and well-nourished. No distress.  HENT:  Head: Normocephalic and atraumatic.  Mouth/Throat: No oropharyngeal exudate.  Eyes: Conjunctivae and EOM are normal. Pupils are equal, round, and reactive to light. Right eye exhibits no discharge. Left eye exhibits no discharge. No scleral icterus.  Neck:  No meningismus  Cardiovascular: Normal rate, regular rhythm, normal heart sounds and intact distal pulses.  Exam reveals no gallop and no friction rub.   No murmur heard. Pulmonary/Chest: Effort normal and breath sounds normal. No respiratory distress. She has no wheezes. She has no rales. She exhibits no tenderness.  Abdominal: Soft. Bowel sounds are normal. She exhibits no distension. There is no tenderness. There is no guarding.  Musculoskeletal: Normal range of motion. She exhibits no edema.  Neurological: She is alert and oriented to person, place, and time.  Skin: Skin is warm and dry. No rash noted. She is not diaphoretic. No erythema. No pallor.  Psychiatric: She has a normal mood and affect. Her behavior is normal.  Nursing note and vitals reviewed.    ED Treatments / Results  Labs (all labs ordered are listed, but only abnormal results are displayed) Labs Reviewed  COMPREHENSIVE METABOLIC PANEL - Abnormal; Notable for the following:       Result Value   Glucose, Bld 110 (*)    Total Bilirubin 0.2 (*)    All other components within normal limits  URINALYSIS, ROUTINE W REFLEX MICROSCOPIC (NOT AT [facility]) - Abnormal; Notable for the following:    APPearance CLOUDY (*)    All other components within normal limits  LIPASE, BLOOD  CBC    EKG  EKG Interpretation None       Radiology No results found.  Procedures Procedures (including critical care time)  Medications Ordered in ED Medications  metoCLOPramide (REGLAN) injection 10 mg (not administered)  diphenhydrAMINE (BENADRYL) injection 12.5 mg (not administered)  dexamethasone (DECADRON) injection 10 mg  (not administered)  sodium chloride 0.9 % bolus 500 mL (not administered)     Initial Impression / Assessment and Plan / ED Course  I have reviewed the triage vital signs and the nursing notes.  Pertinent labs & imaging results that were available during my care of the patient were reviewed by me and considered in my medical decision making (see chart for details).  Clinical Course    55 year old female with history of migraines presents to the ED today complaining of diarrhea and headache  2 days. Patient has history of migraines and states this feels similar to previous. On exam, no sign of meningitis, SAH. Patient also here with diarrhea for 2 days after eating at a cookout. Abdomen is soft, nontender. Bowel sounds are normal. All lab work within normal limits. Likely enteritis. Patient given IV fluids, Reglan, Benadryl, Toradol and Decadron. Patient signed out to Custar  Pisciotta PA-C at shift change pending reevaluation. Will DC home with Imodium and recommend PCP follow-up.   Final Clinical Impressions(s) / ED Diagnoses   Final diagnoses:  Migraine without aura and without status migrainosus, not intractable  Diarrhea, unspecified type    New Prescriptions New Prescriptions   No medications on file     Carlos Levering, PA-C 09/05/15 Abanda, MD 09/09/15 [phone]

## 2015-09-05 NOTE — Discharge Instructions (Signed)
Take Naprosyn as needed for headache. Take immodium for diarrhea. Encourage adequate hydration, drink plenty of fluids. Follow up with your PCP. Return to the ED if you experience blurry vision, vomiting, increased abdominal pain, blood in stool, neck pain or stiffness, weakness.

## 2015-09-22 LAB — GLUCOSE, POCT (MANUAL RESULT ENTRY): POC Glucose: 114 mg/dl — AB (ref 70–99)

## 2016-10-23 DIAGNOSIS — M1990 Unspecified osteoarthritis, unspecified site: Secondary | ICD-10-CM | POA: Insufficient documentation

## 2016-10-29 ENCOUNTER — Other Ambulatory Visit: Payer: Self-pay | Admitting: Surgical Oncology

## 2016-10-29 DIAGNOSIS — K449 Diaphragmatic hernia without obstruction or gangrene: Secondary | ICD-10-CM

## 2016-10-31 ENCOUNTER — Encounter (HOSPITAL_COMMUNITY): Payer: Self-pay | Admitting: *Deleted

## 2016-10-31 ENCOUNTER — Ambulatory Visit
Admission: RE | Admit: 2016-10-31 | Discharge: 2016-10-31 | Disposition: A | Discharge status: Home / Self Care | Payer: Medicare Other | Source: Ambulatory Visit | Attending: Surgical Oncology | Admitting: Surgical Oncology

## 2016-10-31 ENCOUNTER — Emergency Department (HOSPITAL_COMMUNITY)
Admission: EM | Admit: 2016-10-31 | Discharge: 2016-10-31 | Disposition: A | Discharge status: Home / Self Care | Payer: Medicare Other | Attending: Emergency Medicine | Admitting: Emergency Medicine

## 2016-10-31 ENCOUNTER — Emergency Department (HOSPITAL_COMMUNITY): Payer: Medicare Other

## 2016-10-31 DIAGNOSIS — J45909 Unspecified asthma, uncomplicated: Secondary | ICD-10-CM | POA: Diagnosis not present

## 2016-10-31 DIAGNOSIS — J019 Acute sinusitis, unspecified: Secondary | ICD-10-CM | POA: Diagnosis not present

## 2016-10-31 DIAGNOSIS — R51 Headache: Secondary | ICD-10-CM | POA: Diagnosis not present

## 2016-10-31 DIAGNOSIS — B349 Viral infection, unspecified: Secondary | ICD-10-CM | POA: Insufficient documentation

## 2016-10-31 DIAGNOSIS — R0981 Nasal congestion: Secondary | ICD-10-CM | POA: Insufficient documentation

## 2016-10-31 DIAGNOSIS — K449 Diaphragmatic hernia without obstruction or gangrene: Secondary | ICD-10-CM

## 2016-10-31 DIAGNOSIS — Z79899 Other long term (current) drug therapy: Secondary | ICD-10-CM | POA: Insufficient documentation

## 2016-10-31 DIAGNOSIS — R05 Cough: Secondary | ICD-10-CM | POA: Diagnosis present

## 2016-10-31 DIAGNOSIS — J0110 Acute frontal sinusitis, unspecified: Secondary | ICD-10-CM

## 2016-10-31 MED ORDER — BENZONATATE 100 MG PO CAPS: 100.0000 mg | ORAL_CAPSULE | Freq: Three times a day (TID) | ORAL | 0 refills | Status: DC

## 2016-10-31 MED ORDER — METOCLOPRAMIDE HCL 5 MG/ML IJ SOLN
10.0000 mg | Freq: Once | INTRAMUSCULAR | Status: AC
  Administered 2016-10-31: 10 mg via INTRAVENOUS
  Filled 2016-10-31: qty 2

## 2016-10-31 MED ORDER — METHYLPREDNISOLONE SODIUM SUCC 125 MG IJ SOLR
80.0000 mg | Freq: Once | INTRAMUSCULAR | Status: AC
  Administered 2016-10-31: 80 mg via INTRAMUSCULAR
  Filled 2016-10-31: qty 2

## 2016-10-31 MED ORDER — PREDNISONE 10 MG (21) PO TBPK: ORAL_TABLET | ORAL | 0 refills | Status: DC

## 2016-10-31 MED ORDER — IPRATROPIUM-ALBUTEROL 0.5-2.5 (3) MG/3ML IN SOLN
3.0000 mL | Freq: Once | RESPIRATORY_TRACT | Status: AC
  Administered 2016-10-31: 3 mL via RESPIRATORY_TRACT
  Filled 2016-10-31: qty 3

## 2016-10-31 MED ORDER — KETOROLAC TROMETHAMINE 30 MG/ML IJ SOLN
30.0000 mg | Freq: Once | INTRAMUSCULAR | Status: AC
  Administered 2016-10-31: 30 mg via INTRAVENOUS
  Filled 2016-10-31: qty 1

## 2016-10-31 MED ORDER — ALBUTEROL SULFATE HFA 108 (90 BASE) MCG/ACT IN AERS: 2.0000 | INHALATION_SPRAY | RESPIRATORY_TRACT | 0 refills | Status: DC | PRN

## 2016-10-31 MED ORDER — SODIUM CHLORIDE 0.9 % IV BOLUS (SEPSIS)
1000.0000 mL | Freq: Once | INTRAVENOUS | Status: AC
  Administered 2016-10-31: 1000 mL via INTRAVENOUS

## 2016-10-31 NOTE — ED Provider Notes (Signed)
[facility] EMERGENCY DEPARTMENT Provider Note   CSN: [id number] Arrival date & time: 10/31/16  1115     History   Chief Complaint Chief Complaint  Patient presents with  . Shortness of Breath  . Migraine    HPI Audrey Weeks is a 56 y.o. female.  HPI   Audrey Weeks is a 56 y.o. female, with a history of asthma and migraines, presenting to the ED with productive cough, nasal and sinus congestion beginning Oct 23.  Has had a headache that began yesterday. Pain is bilateral frontal, pressure, 10/10, nonradiating. States it feels like one of her normal migraines.  Accompanied by some photophobia.  Has tried Theraflu and herbal tea with minimal improvement.  Endorses some shortness of breath with coughing.  She has not used an albuterol inhaler in over a year.  Denies fever/chills, N/V/D, CP, abdominal pain, dizziness, neuro deficits, vision abnormalities, or any other complaints.   Past Medical History:  Diagnosis Date  . Asthma   . Migraines     Patient Active Problem List   Diagnosis Date Noted  . OBESITY 05/10/2009  . ANXIETY 05/10/2009  . MIGRAINE HEADACHE 04/15/2009  . PNEUMONIA, COMMUNITY ACQUIRED, PNEUMOCOCCAL 04/15/2009  . ASTHMA 04/15/2009    Past Surgical History:  Procedure Laterality Date  . CESAREAN SECTION      OB History    No data available       Home Medications    Prior to Admission medications   Medication Sig Start Date End Date Taking? Authorizing Provider  albuterol (PROVENTIL HFA;VENTOLIN HFA) 108 (90 Base) MCG/ACT inhaler Inhale 2 puffs into the lungs every 4 (four) hours as needed for wheezing or shortness of breath. 10/31/16   Elizabet Schweppe C, PA-C  benzonatate (TESSALON) 100 MG capsule Take 1 capsule (100 mg total) by mouth every 8 (eight) hours. 10/31/16   Bora Bost, Helane Gunther, PA-C  Biotin 1000 MCG tablet Take 1,000 mcg by mouth daily.    [provider]  diazepam (VALIUM) 5 MG tablet Take 1 tablet (5 mg  total) by mouth every 12 (twelve) hours as needed for muscle spasms. Patient not taking: Reported on 09/05/2015 04/10/15   Antonietta Breach, PA-C  HYDROcodone-ibuprofen (VICOPROFEN) 7.5-200 MG tablet Take 1 tablet by mouth every 4 (four) hours as needed for pain. 08/03/15   [provider]  loperamide (IMODIUM) 2 MG capsule Take 1 capsule (2 mg total) by mouth 4 (four) times daily as needed for diarrhea or loose stools. 09/05/15   Dowless, Aldona Bar Tripp, PA-C  meloxicam (MOBIC) 7.5 MG tablet Take 2 tablets (15 mg total) by mouth daily. Patient not taking: Reported on 09/05/2015 04/10/15   Antonietta Breach, PA-C  naproxen (NAPROSYN) 500 MG tablet Take 1 tablet (500 mg total) by mouth 2 (two) times daily. 09/05/15   Dowless, Aldona Bar Tripp, PA-C  oxyCODONE-acetaminophen (PERCOCET/ROXICET) 5-325 MG tablet Take 1-2 tablets by mouth every 6 (six) hours as needed for severe pain. Patient not taking: Reported on 09/05/2015 04/10/15   Antonietta Breach, PA-C  phentermine (ADIPEX-P) 37.5 MG tablet Take 37.5 mg by mouth daily. 08/03/15   [provider]  predniSONE (STERAPRED UNI-PAK 21 TAB) 10 MG (21) TBPK tablet Take 6 tabs (60mg ) on day 1, 5 tabs (50mg ) on day 2, 4 tabs (40mg ) on day 3, 3 tabs (30mg ) on day 4, 2 tabs (20mg ) on day 5, and 1 tab (10mg ) on day 6. 10/31/16   Andrian Urbach C, PA-C  traMADol (ULTRAM) 50 MG  tablet Take 1 tablet (50 mg total) by mouth every 6 (six) hours as needed. 09/05/15   Pisciotta, Elmyra Ricks, PA-C  traZODone (DESYREL) 150 MG tablet Take 200 mg by mouth at bedtime.     [provider]    Family History Family History  Problem Relation Age of Onset  . Hypertension Other   . Diabetes Other     Social History Social History  Substance Use Topics  . Smoking status: Never Smoker  . Smokeless tobacco: Never Used  . Alcohol use No     Comment: occ     Allergies   Patient has no known allergies.   Review of Systems Review of Systems  Constitutional: Negative for  chills, diaphoresis and fever.  HENT: Positive for congestion, rhinorrhea, sinus pain and sinus pressure. Negative for trouble swallowing.   Respiratory: Positive for cough and shortness of breath.   Gastrointestinal: Negative for abdominal pain, diarrhea, nausea and vomiting.  Musculoskeletal: Negative for neck pain and neck stiffness.  Skin: Negative for rash.  Neurological: Positive for headaches. Negative for dizziness, syncope, weakness, light-headedness and numbness.  All other systems reviewed and are negative.    Physical Exam Updated Vital Signs BP (!) 134/109 (BP Location: Left Arm)   Pulse 72   Temp 98 F (36.7 C) (Oral)   Resp 20   LMP 12/08/2011   SpO2 94%   Physical Exam  Constitutional: She is oriented to person, place, and time. She appears well-developed and well-nourished. No distress.  HENT:  Head: Normocephalic and atraumatic.  Right Ear: Tympanic membrane, external ear and ear canal normal.  Left Ear: Tympanic membrane, external ear and ear canal normal.  Nose: Mucosal edema and rhinorrhea present. Right sinus exhibits frontal sinus tenderness. Left sinus exhibits frontal sinus tenderness.  Eyes: Pupils are equal, round, and reactive to light. Conjunctivae and EOM are normal.  Neck: Neck supple.  Cardiovascular: Normal rate, regular rhythm, normal heart sounds and intact distal pulses.   Pulmonary/Chest: Effort normal and breath sounds normal. No respiratory distress.  Speaks in full sentences without difficulty.  No noted increased work of breathing.  Abdominal: Soft. There is no tenderness. There is no guarding.  Musculoskeletal: She exhibits no edema.  Lymphadenopathy:    She has no cervical adenopathy.  Neurological: She is alert and oriented to person, place, and time.  No sensory deficits. Strength 5/5 in all extremities. No gait disturbance. Coordination intact including heel to shin and finger to nose. Cranial nerves III-XII grossly intact. No facial  droop.   Skin: Skin is warm and dry. Capillary refill takes less than 2 seconds. She is not diaphoretic.  Psychiatric: She has a normal mood and affect. Her behavior is normal.  Nursing note and vitals reviewed.    ED Treatments / Results  Labs (all labs ordered are listed, but only abnormal results are displayed) Labs Reviewed - No data to display  EKG  EKG Interpretation None       Radiology Dg Chest 2 View  Result Date: 10/31/2016 CLINICAL DATA:  Cough and shortness of breath for 2 days. EXAM: CHEST  2 VIEW COMPARISON:  01/03/2015 FINDINGS: The heart size and mediastinal contours are within normal limits. Both lungs are clear. The visualized skeletal structures are unremarkable. IMPRESSION: Stable exam.  No active cardiopulmonary disease. Electronically Signed   By: Earle Gell M.D.   On: 10/31/2016 12:12   Dg Ugi W/high Density W/kub  Result Date: 10/31/2016 CLINICAL DATA:  Pre bariatric surgery, evaluate  for hiatal hernia EXAM: UPPER GI SERIES WITH KUB TECHNIQUE: After obtaining a scout radiograph a routine upper GI series was performed using thin and high density barium. FLUOROSCOPY TIME:  Fluoroscopy Time:  1 minutes 18 seconds Radiation Exposure Index (if provided by the fluoroscopic device): 318 mGy Number of Acquired Spot Images: 0 COMPARISON:  None. FINDINGS: A preliminary film the abdomen shows a nonspecific bowel gas pattern. No opaque calculi are seen. There are degenerative changes present in the lower lumbar spine. A double-contrast study was performed. The mucosa of the esophagus is well visualized with no abnormality noted. Esophageal peristalsis is normal. No hiatal hernia is seen and no gastroesophageal reflux is demonstrated. The stomach is normal in contour and peristalsis. The duodenal bulb fills and the duodenal loop is in normal position IMPRESSION: Negative upper GI.  No hiatal hernia.  No gastroesophageal reflux. Electronically Signed   By: Ivar Drape M.D.    On: 10/31/2016 10:22    Procedures Procedures (including critical care time)  Medications Ordered in ED Medications  ipratropium-albuterol (DUONEB) 0.5-2.5 (3) MG/3ML nebulizer solution 3 mL (3 mLs Nebulization Given 10/31/16 1516)  methylPREDNISolone sodium succinate (SOLU-MEDROL) 125 mg/2 mL injection 80 mg (80 mg Intramuscular Given 10/31/16 1516)  sodium chloride 0.9 % bolus 1,000 mL (0 mLs Intravenous Stopped 10/31/16 1743)  ketorolac (TORADOL) 30 MG/ML injection 30 mg (30 mg Intravenous Given 10/31/16 1604)  metoCLOPramide (REGLAN) injection 10 mg (10 mg Intravenous Given 10/31/16 1604)     Initial Impression / Assessment and Plan / ED Course  I have reviewed the triage vital signs and the nursing notes.  Pertinent labs & imaging results that were available during my care of the patient were reviewed by me and considered in my medical decision making (see chart for details).  Clinical Course as of Nov 01 1257  Thu Oct 31, 2016  1554 Patient voices no improvement in her symptoms.  [SJ]  4132 Patient states she feels much better.  Headache rated 6/10.  States she is ready for discharge.  [SJ]    Clinical Course User Index [SJ] Jaxsyn Azam C, PA-C    Patient presents with headache, cough, as well as URI symptoms.  Sinusitis also within the differential.  Suspect viral origin. Patient is nontoxic appearing, afebrile, not tachycardic, not tachypneic, not hypotensive, and maintains excellent SPO2 on room air.  Low suspicion for sepsis.  Improvement over ED course.  PCP follow-up. The patient was given instructions for home care as well as return precautions. Patient voices understanding of these instructions, accepts the plan, and is comfortable with discharge.   Vitals:   10/31/16 1128 10/31/16 1747  BP: (!) 134/109 126/73  Pulse: 72 79  Resp: 20 19  Temp: 98 F (36.7 C)   TempSrc: Oral   SpO2: 94% 100%    Final Clinical Impressions(s) / ED Diagnoses   Final diagnoses:    Viral syndrome  Acute non-recurrent frontal sinusitis    New Prescriptions Discharge Medication List as of 10/31/2016  5:43 PM    START taking these medications   Details  albuterol (PROVENTIL HFA;VENTOLIN HFA) 108 (90 Base) MCG/ACT inhaler Inhale 2 puffs into the lungs every 4 (four) hours as needed for wheezing or shortness of breath., Starting Thu 10/31/2016, Print    benzonatate (TESSALON) 100 MG capsule Take 1 capsule (100 mg total) by mouth every 8 (eight) hours., Starting Thu 10/31/2016, Print    predniSONE (STERAPRED UNI-PAK 21 TAB) 10 MG (21) TBPK tablet Take 6  tabs (60mg ) on day 1, 5 tabs (50mg ) on day 2, 4 tabs (40mg ) on day 3, 3 tabs (30mg ) on day 4, 2 tabs (20mg ) on day 5, and 1 tab (10mg ) on day 6., Print         Lorayne Bender, PA-C 11/01/16 1301    Lajean Saver, MD 11/01/16 [phone]

## 2016-10-31 NOTE — ED Triage Notes (Signed)
To ED for eval of sob, sinus pain, congestion, cough, and migraine. Cough started 2 days ago. Sob and migraine started today. Cough is minimally productive.

## 2016-10-31 NOTE — Discharge Instructions (Signed)
There were no acute abnormalities on the chest x-ray.  Your symptoms are consistent with a viral illness. Viruses do not require antibiotics. Treatment is symptomatic care and it is important to note that these symptoms may last for 7-14 days.   Hand washing: Wash your hands throughout the day, but especially before and after touching the face, using the restroom, sneezing, coughing, or touching surfaces that have been coughed or sneezed upon. Hydration: Symptoms will be intensified and complicated by dehydration. Dehydration can also extend the duration of symptoms. Drink plenty of fluids and get plenty of rest. You should be drinking at least half a liter of water an hour to stay hydrated. Electrolyte drinks are also encouraged. You should be drinking enough fluids to make your urine light yellow, almost clear. If this is not the case, you are not drinking enough water. Please note that some of the treatments indicated below will not be effective if you are not adequately hydrated. Pain or fever: Ibuprofen, Naproxen, or Tylenol for pain or fever.  Cough: Use the Tessalon for cough.  Congestion: Plain Mucinex may help relieve congestion. Saline sinus rinses and saline nasal sprays may also help relieve congestion. If you do not have heart problems or an allergy to such medications, you may also try phenylephrine or Sudafed. Sore throat: Warm liquids or Chloraseptic spray may help soothe a sore throat. Gargle twice a day with a salt water solution made from a half teaspoon of salt in a cup of warm water.  Follow up: Follow up with a primary care provider, as needed, for any future management of this issue.

## 2016-11-04 ENCOUNTER — Other Ambulatory Visit: Payer: Self-pay

## 2017-02-12 ENCOUNTER — Ambulatory Visit (HOSPITAL_COMMUNITY): Payer: Self-pay | Admitting: Psychiatry

## 2017-08-12 ENCOUNTER — Encounter (HOSPITAL_COMMUNITY): Payer: Self-pay

## 2017-08-12 ENCOUNTER — Emergency Department (HOSPITAL_COMMUNITY): Payer: Medicare Other

## 2017-08-12 ENCOUNTER — Emergency Department (HOSPITAL_COMMUNITY)
Admission: EM | Admit: 2017-08-12 | Discharge: 2017-08-12 | Disposition: A | Discharge status: Home / Self Care | Payer: Medicare Other | Attending: Emergency Medicine | Admitting: Emergency Medicine

## 2017-08-12 ENCOUNTER — Other Ambulatory Visit: Payer: Self-pay

## 2017-08-12 DIAGNOSIS — J45909 Unspecified asthma, uncomplicated: Secondary | ICD-10-CM | POA: Insufficient documentation

## 2017-08-12 DIAGNOSIS — R1031 Right lower quadrant pain: Secondary | ICD-10-CM | POA: Insufficient documentation

## 2017-08-12 DIAGNOSIS — M545 Low back pain: Secondary | ICD-10-CM | POA: Diagnosis present

## 2017-08-12 DIAGNOSIS — F419 Anxiety disorder, unspecified: Secondary | ICD-10-CM | POA: Insufficient documentation

## 2017-08-12 DIAGNOSIS — R109 Unspecified abdominal pain: Secondary | ICD-10-CM

## 2017-08-12 DIAGNOSIS — Z79899 Other long term (current) drug therapy: Secondary | ICD-10-CM | POA: Insufficient documentation

## 2017-08-12 LAB — URINALYSIS, ROUTINE W REFLEX MICROSCOPIC
BILIRUBIN URINE: NEGATIVE
GLUCOSE, UA: NEGATIVE mg/dL
HGB URINE DIPSTICK: NEGATIVE
Ketones, ur: NEGATIVE mg/dL
Leukocytes, UA: NEGATIVE
NITRITE: NEGATIVE
PH: 5 (ref 5.0–8.0)
Protein, ur: NEGATIVE mg/dL
SPECIFIC GRAVITY, URINE: 1.024 (ref 1.005–1.030)

## 2017-08-12 MED ORDER — METHOCARBAMOL 500 MG PO TABS: 1000.0000 mg | ORAL_TABLET | Freq: Four times a day (QID) | ORAL | 0 refills | Status: DC | PRN

## 2017-08-12 MED ORDER — HYDROCODONE-ACETAMINOPHEN 5-325 MG PO TABS: ORAL_TABLET | ORAL | 0 refills | Status: DC

## 2017-08-12 MED ORDER — METHOCARBAMOL 500 MG PO TABS
1000.0000 mg | ORAL_TABLET | Freq: Once | ORAL | Status: AC
  Administered 2017-08-12: 1000 mg via ORAL
  Filled 2017-08-12: qty 2

## 2017-08-12 MED ORDER — KETOROLAC TROMETHAMINE 60 MG/2ML IM SOLN
30.0000 mg | Freq: Once | INTRAMUSCULAR | Status: AC
  Administered 2017-08-12: 30 mg via INTRAMUSCULAR
  Filled 2017-08-12: qty 2

## 2017-08-12 MED ORDER — HYDROMORPHONE HCL 1 MG/ML IJ SOLN
1.0000 mg | Freq: Once | INTRAMUSCULAR | Status: AC
  Administered 2017-08-12: 1 mg via INTRAMUSCULAR
  Filled 2017-08-12: qty 1

## 2017-08-12 NOTE — ED Triage Notes (Signed)
Pt c/o pain in r flank x 2 days.  Denies injury.

## 2017-08-12 NOTE — ED Notes (Signed)
Pain decreased to 8.

## 2017-08-12 NOTE — ED Provider Notes (Signed)
[facility] EMERGENCY DEPARTMENT Provider Note   CSN: [id number] Arrival date & time: 08/12/17  7619     History   Chief Complaint Chief Complaint  Patient presents with  . Flank Pain    HPI Audrey Weeks is a 57 y.o. female.  HPI  Pt was seen at [place]. Per pt, c/o gradual onset and persistence of constant right sided low back "pain" for the past 1 week. Describes the pain as "aching." Pain worsens with palpation of the area and body position changes. Denies incont/retention of bowel or bladder, no saddle anesthesia, no focal motor weakness, no tingling/numbness in extremities, no fevers, no injury, no abd pain, no N/V/D, no CP/SOB, no rash.   The symptoms have been associated with no other complaints.      Past Medical History:  Diagnosis Date  . Asthma   . Migraines     Patient Active Problem List   Diagnosis Date Noted  . OBESITY 05/10/2009  . ANXIETY 05/10/2009  . MIGRAINE HEADACHE 04/15/2009  . PNEUMONIA, COMMUNITY ACQUIRED, PNEUMOCOCCAL 04/15/2009  . ASTHMA 04/15/2009    Past Surgical History:  Procedure Laterality Date  . CESAREAN SECTION       OB History   None      Home Medications    Prior to Admission medications   Medication Sig Start Date End Date Taking? Authorizing Provider  albuterol (PROVENTIL HFA;VENTOLIN HFA) 108 (90 Base) MCG/ACT inhaler Inhale 2 puffs into the lungs every 4 (four) hours as needed for wheezing or shortness of breath. 10/31/16   Joy, Shawn C, PA-C  benzonatate (TESSALON) 100 MG capsule Take 1 capsule (100 mg total) by mouth every 8 (eight) hours. 10/31/16   Joy, Helane Gunther, PA-C  Biotin 1000 MCG tablet Take 1,000 mcg by mouth daily.    [provider]  diazepam (VALIUM) 5 MG tablet Take 1 tablet (5 mg total) by mouth every 12 (twelve) hours as needed for muscle spasms. Patient not taking: Reported on 09/05/2015 04/10/15   Antonietta Breach, PA-C  HYDROcodone-ibuprofen (VICOPROFEN) 7.5-200 MG tablet Take 1 tablet by mouth  every 4 (four) hours as needed for pain. 08/03/15   [provider]  loperamide (IMODIUM) 2 MG capsule Take 1 capsule (2 mg total) by mouth 4 (four) times daily as needed for diarrhea or loose stools. 09/05/15   Dowless, Aldona Bar Tripp, PA-C  meloxicam (MOBIC) 7.5 MG tablet Take 2 tablets (15 mg total) by mouth daily. Patient not taking: Reported on 09/05/2015 04/10/15   Antonietta Breach, PA-C  naproxen (NAPROSYN) 500 MG tablet Take 1 tablet (500 mg total) by mouth 2 (two) times daily. 09/05/15   Dowless, Aldona Bar Tripp, PA-C  oxyCODONE-acetaminophen (PERCOCET/ROXICET) 5-325 MG tablet Take 1-2 tablets by mouth every 6 (six) hours as needed for severe pain. Patient not taking: Reported on 09/05/2015 04/10/15   Antonietta Breach, PA-C  phentermine (ADIPEX-P) 37.5 MG tablet Take 37.5 mg by mouth daily. 08/03/15   [provider]  predniSONE (STERAPRED UNI-PAK 21 TAB) 10 MG (21) TBPK tablet Take 6 tabs (60mg ) on day 1, 5 tabs (50mg ) on day 2, 4 tabs (40mg ) on day 3, 3 tabs (30mg ) on day 4, 2 tabs (20mg ) on day 5, and 1 tab (10mg ) on day 6. 10/31/16   Joy, Shawn C, PA-C  traMADol (ULTRAM) 50 MG tablet Take 1 tablet (50 mg total) by mouth every 6 (six) hours as needed. 09/05/15   Pisciotta, Elmyra Ricks, PA-C  traZODone (DESYREL) 150 MG tablet Take 200 mg  by mouth at bedtime.     [provider]    Family History Family History  Problem Relation Age of Onset  . Hypertension Other   . Diabetes Other     Social History Social History   Tobacco Use  . Smoking status: Never Smoker  . Smokeless tobacco: Never Used  Substance Use Topics  . Alcohol use: No    Comment: occ  . Drug use: No     Allergies   Patient has no known allergies.   Review of Systems Review of Systems ROS: Statement: All systems negative except as marked or noted in the HPI; Constitutional: Negative for fever and chills. ; ; Eyes: Negative for eye pain, redness and discharge. ; ; ENMT: Negative for ear pain,  hoarseness, nasal congestion, sinus pressure and sore throat. ; ; Cardiovascular: Negative for chest pain, palpitations, diaphoresis, dyspnea and peripheral edema. ; ; Respiratory: Negative for cough, wheezing and stridor. ; ; Gastrointestinal: Negative for nausea, vomiting, diarrhea, abdominal pain, blood in stool, hematemesis, jaundice and rectal bleeding. . ; ; Genitourinary: Negative for dysuria, flank pain and hematuria. ; ; Musculoskeletal: +LBP. Negative for neck pain. Negative for swelling and trauma.; ; Skin: Negative for pruritus, rash, abrasions, blisters, bruising and skin lesion.; ; Neuro: Negative for headache, lightheadedness and neck stiffness. Negative for weakness, altered level of consciousness, altered mental status, extremity weakness, paresthesias, involuntary movement, seizure and syncope.       Physical Exam Updated Vital Signs BP 121/73 (BP Location: Right Arm)   Pulse 66   Temp 97.8 F (36.6 C) (Oral)   Resp 18   Ht 5\' 2"  (1.575 m)   Wt 104.3 kg (230 lb)   LMP 12/08/2011   SpO2 96%   BMI 42.07 kg/m   Physical Exam 0730: Physical examination:  Nursing notes reviewed; Vital signs and O2 SAT reviewed;  Constitutional: Well developed, Well nourished, Well hydrated, In no acute distress; Head:  Normocephalic, atraumatic; Eyes: EOMI, PERRL, No scleral icterus; ENMT: Mouth and pharynx normal, Mucous membranes moist; Neck: Supple, Full range of motion, No lymphadenopathy; Cardiovascular: Regular rate and rhythm, No gallop; Respiratory: Breath sounds clear & equal bilaterally, No wheezes.  Speaking full sentences with ease, Normal respiratory effort/excursion; Chest: Nontender, Movement normal; Abdomen: Soft, Nontender, Nondistended, Normal bowel sounds; Genitourinary: No CVA tenderness; Spine:  No midline CS, TS, LS tenderness. +TTP right lumbar paraspinal muscles.;; Extremities: Peripheral pulses normal, No tenderness, No edema, No calf edema or asymmetry.; Neuro: AA&Ox3,  Major CN grossly intact.  Speech clear. No gross focal motor or sensory deficits in extremities.; Skin: Color normal, Warm, Dry.   ED Treatments / Results  Labs (all labs ordered are listed, but only abnormal results are displayed)   EKG None  Radiology   Procedures Procedures (including critical care time)  Medications Ordered in ED Medications  HYDROmorphone (DILAUDID) injection 1 mg (1 mg Intramuscular Given 08/12/17 0737)  ketorolac (TORADOL) injection 30 mg (30 mg Intramuscular Given 08/12/17 0942)  methocarbamol (ROBAXIN) tablet 1,000 mg (1,000 mg Oral Given 08/12/17 0942)     Initial Impression / Assessment and Plan / ED Course  I have reviewed the triage vital signs and the nursing notes.  Pertinent labs & imaging results that were available during my care of the patient were reviewed by me and considered in my medical decision making (see chart for details).  MDM Reviewed: previous chart, nursing note and vitals Interpretation: labs and CT scan   Results for orders placed or  performed during the hospital encounter of 08/12/17  Urinalysis, Routine w reflex microscopic  Result Value Ref Range   Color, Urine YELLOW YELLOW   APPearance CLEAR CLEAR   Specific Gravity, Urine 1.024 1.005 - 1.030   pH 5.0 5.0 - 8.0   Glucose, UA NEGATIVE NEGATIVE mg/dL   Hgb urine dipstick NEGATIVE NEGATIVE   Bilirubin Urine NEGATIVE NEGATIVE   Ketones, ur NEGATIVE NEGATIVE mg/dL   Protein, ur NEGATIVE NEGATIVE mg/dL   Nitrite NEGATIVE NEGATIVE   Leukocytes, UA NEGATIVE NEGATIVE   Ct Renal Stone Study Result Date: 08/12/2017 CLINICAL DATA:  57 year old female with right flank pain for 2 days. Denies hematuria. Initial encounter. EXAM: CT ABDOMEN AND PELVIS WITHOUT CONTRAST TECHNIQUE: Multidetector CT imaging of the abdomen and pelvis was performed following the standard protocol without IV contrast. COMPARISON:  No comparison CT. FINDINGS: Lower chest: Minimal scarring/atelectasis lung  bases. Heart size top-normal. Hepatobiliary: Enlarged fatty liver spanning over 18.4 cm. Taking into account limitation by non contrast imaging, no worrisome hepatic lesion. No calcified gallstones. Pancreas: Taking into account limitation by non contrast imaging, no worrisome pancreatic lesion or inflammation. Spleen: Taking into account limitation by non contrast imaging, no splenic mass or enlargement. Adrenals/Urinary Tract: No obstructing stone or hydronephrosis. Taking into account limitation by non contrast imaging, no worrisome renal or adrenal lesion. Noncontrast filled views of the urinary bladder without gross abnormality. Stomach/Bowel: Diverticulosis most notable descending colon and sigmoid colon without CT evidence of diverticulitis. Increase number of normal size lymph nodes right lower quadrant adjacent to the appendix which does not appear inflamed. Abnormal appearance of the gastric body. Polypoid mass not excluded although this may represent ingested material. Vascular/Lymphatic: Mild atherosclerotic changes aorta and common iliac arteries. No abdominal aortic aneurysm. Increase number of normal size mesenteric lymph nodes. Reproductive: Enlarged lobulated uterus may contain fibroids. No worrisome adnexal mass. Other: No free air or bowel containing hernia. Musculoskeletal: Sacroiliac joint degenerative changes. Facet degenerative changes L4-5 and L5-S1. IMPRESSION: 1. No renal/ureteral obstructing stone or hydronephrosis. 2. Abnormal appearance of gastric body. Possibility of mass is raised although it is possible this represents ingested material. 3. Diverticulosis most notable descending colon and sigmoid colon without CT evidence of diverticulitis. 4. Slight increase number of normal size lymph nodes adjacent to appendix which does not appear inflamed. 5. Enlarged fatty liver. 6. Enlarged lobulated uterus may contain fibroids and is pressing upon the posterior aspect of the bladder. 7.   Aortic Atherosclerosis (ICD10-I70.0). 8. Sacroiliac joint degenerative changes and facet degenerative changes L4-5 and L5-S1. Electronically Signed   By: Genia Del M.D.   On: 08/12/2017 08:35    1125:  No UTI on Udip. CT without acute findings. Feels better after meds and wants to go home now. Pt has ambulated with steady gait, easy resps, NAD. Tx symptomatically at this time.  Dx and testing d/w pt and family.  Questions answered.  Verb understanding, agreeable to d/c home with outpt f/u.    Final Clinical Impressions(s) / ED Diagnoses   Final diagnoses:  None    ED Discharge Orders    None       Francine Graven, DO 08/15/17 6203

## 2017-08-12 NOTE — Discharge Instructions (Signed)
Take the prescriptions as directed.  Apply moist heat or ice to the area(s) of discomfort, for 15 minutes at a time, several times per day for the next few days.  Do not fall asleep on a heating or ice pack.  Call your regular medical doctor today to schedule a follow up appointment this week.  Return to the Emergency Department immediately if worsening. ° °

## 2017-08-12 NOTE — ED Notes (Signed)
MD at bedside. 

## 2017-08-12 NOTE — ED Notes (Signed)
Pt was informed that we need a urine sample. Pt states that she can not urinate at this time. 

## 2017-08-13 LAB — URINE CULTURE: Culture: NO GROWTH

## 2017-08-13 NOTE — ED Provider Notes (Signed)
Received call from pharmacy.  Patient has oxycodone 10 mg that he had a 150 pills for 3 weeks ago.  Will not fill the hydrocodone.  She still should have available oxycodone.   Davonna Belling, MD 08/13/17 1151

## 2017-09-12 ENCOUNTER — Encounter: Payer: Self-pay | Admitting: *Deleted

## 2017-09-29 ENCOUNTER — Encounter: Payer: Self-pay | Admitting: Obstetrics & Gynecology

## 2017-09-29 ENCOUNTER — Ambulatory Visit (INDEPENDENT_AMBULATORY_CARE_PROVIDER_SITE_OTHER): Payer: Medicare Other | Admitting: Obstetrics & Gynecology

## 2017-09-29 VITALS — BP 121/85 | HR 74 | Ht 62.0 in | Wt 234.3 lb

## 2017-09-29 DIAGNOSIS — D219 Benign neoplasm of connective and other soft tissue, unspecified: Secondary | ICD-10-CM

## 2017-09-29 DIAGNOSIS — Z0000 Encounter for general adult medical examination without abnormal findings: Secondary | ICD-10-CM

## 2017-09-29 DIAGNOSIS — Z23 Encounter for immunization: Secondary | ICD-10-CM

## 2017-09-29 NOTE — Progress Notes (Signed)
   Subjective:    Patient ID: Audrey Weeks, female    DOB: 1960/07/07, 57 y.o.   MRN: [id number]  HPI 57 yo living with P5 (58 to 25 yo kids, 2 grands) here today because a CT done for flank pain last month showed fibroids. She would like more info about fibroids. She has not heard this diagnosis prior to the CT.     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 She took early retirement and SSI. Monogamous for 5 years She reports LMP in 2013, maybe longer.    Objective:   Physical Exam Breathing, conversing, and ambulating normally Well nourished, well hydrated Black female, no apparent distress  She is getting a physical and pap smear at [facility] today and declines an exam today.      Assessment & Plan:  Fibroids seen on CT- I will order an u/s for further evaluation. She is concerned that her fibroids are responsible for her inability to lose weight. I have reassured her that they are not culpable. TDAP today She declines a flu vaccine today.

## 2017-10-02 ENCOUNTER — Ambulatory Visit (HOSPITAL_COMMUNITY): Admission: RE | Admit: 2017-10-02 | Payer: Medicare Other | Source: Ambulatory Visit

## 2017-10-24 ENCOUNTER — Encounter (HOSPITAL_COMMUNITY): Payer: Self-pay

## 2017-10-24 ENCOUNTER — Other Ambulatory Visit: Payer: Self-pay | Admitting: Adult Health

## 2017-10-24 ENCOUNTER — Ambulatory Visit: Payer: Self-pay | Admitting: Obstetrics & Gynecology

## 2017-11-10 ENCOUNTER — Ambulatory Visit (HOSPITAL_COMMUNITY): Payer: Medicare Other | Attending: Obstetrics & Gynecology

## 2018-06-11 ENCOUNTER — Other Ambulatory Visit: Payer: Self-pay | Admitting: Nurse Practitioner

## 2018-06-11 DIAGNOSIS — Z1231 Encounter for screening mammogram for malignant neoplasm of breast: Secondary | ICD-10-CM

## 2018-08-14 ENCOUNTER — Ambulatory Visit: Payer: Medicare Other

## 2018-09-30 ENCOUNTER — Other Ambulatory Visit: Payer: Self-pay

## 2018-09-30 ENCOUNTER — Ambulatory Visit
Admission: RE | Admit: 2018-09-30 | Discharge: 2018-09-30 | Disposition: A | Discharge status: Home / Self Care | Payer: Medicare HMO | Source: Ambulatory Visit | Attending: Nurse Practitioner | Admitting: Nurse Practitioner

## 2018-09-30 DIAGNOSIS — Z1231 Encounter for screening mammogram for malignant neoplasm of breast: Secondary | ICD-10-CM

## 2018-10-27 ENCOUNTER — Encounter (HOSPITAL_COMMUNITY): Payer: Self-pay | Admitting: Emergency Medicine

## 2018-10-27 ENCOUNTER — Emergency Department (HOSPITAL_COMMUNITY)
Admission: EM | Admit: 2018-10-27 | Discharge: 2018-10-27 | Disposition: A | Discharge status: Home / Self Care | Payer: Medicare Other | Attending: Emergency Medicine | Admitting: Emergency Medicine

## 2018-10-27 ENCOUNTER — Other Ambulatory Visit: Payer: Self-pay

## 2018-10-27 ENCOUNTER — Emergency Department (HOSPITAL_COMMUNITY): Payer: Medicare Other

## 2018-10-27 DIAGNOSIS — Z79899 Other long term (current) drug therapy: Secondary | ICD-10-CM | POA: Diagnosis not present

## 2018-10-27 DIAGNOSIS — S3992XA Unspecified injury of lower back, initial encounter: Secondary | ICD-10-CM | POA: Diagnosis present

## 2018-10-27 DIAGNOSIS — S39012A Strain of muscle, fascia and tendon of lower back, initial encounter: Secondary | ICD-10-CM | POA: Diagnosis not present

## 2018-10-27 DIAGNOSIS — J45909 Unspecified asthma, uncomplicated: Secondary | ICD-10-CM | POA: Diagnosis not present

## 2018-10-27 DIAGNOSIS — Y999 Unspecified external cause status: Secondary | ICD-10-CM | POA: Insufficient documentation

## 2018-10-27 DIAGNOSIS — Y92838 Other recreation area as the place of occurrence of the external cause: Secondary | ICD-10-CM | POA: Insufficient documentation

## 2018-10-27 DIAGNOSIS — W01198A Fall on same level from slipping, tripping and stumbling with subsequent striking against other object, initial encounter: Secondary | ICD-10-CM | POA: Diagnosis not present

## 2018-10-27 DIAGNOSIS — S299XXA Unspecified injury of thorax, initial encounter: Secondary | ICD-10-CM | POA: Diagnosis not present

## 2018-10-27 DIAGNOSIS — Y9301 Activity, walking, marching and hiking: Secondary | ICD-10-CM | POA: Insufficient documentation

## 2018-10-27 DIAGNOSIS — S298XXA Other specified injuries of thorax, initial encounter: Secondary | ICD-10-CM

## 2018-10-27 LAB — URINALYSIS, ROUTINE W REFLEX MICROSCOPIC
Bilirubin Urine: NEGATIVE
Glucose, UA: NEGATIVE mg/dL
Hgb urine dipstick: NEGATIVE
Ketones, ur: NEGATIVE mg/dL
Leukocytes,Ua: NEGATIVE
Nitrite: NEGATIVE
Protein, ur: NEGATIVE mg/dL
Specific Gravity, Urine: 1.024 (ref 1.005–1.030)
pH: 5 (ref 5.0–8.0)

## 2018-10-27 MED ORDER — HYDROMORPHONE HCL 2 MG/ML IJ SOLN
2.0000 mg | Freq: Once | INTRAMUSCULAR | Status: AC
  Administered 2018-10-27: 04:00:00 2 mg via INTRAMUSCULAR
  Filled 2018-10-27: qty 1

## 2018-10-27 NOTE — ED Triage Notes (Signed)
Pt c/o back and flank pain after a fall on 10/19/18. Pt states she "twisted when she fell."

## 2018-10-27 NOTE — ED Notes (Signed)
Pt aware of need for urine specimen. 

## 2018-10-27 NOTE — ED Provider Notes (Signed)
[facility] EMERGENCY DEPARTMENT Provider Note   CSN: [id number] Arrival date & time: 10/27/18  0302     History   Chief Complaint Chief Complaint  Patient presents with  . Back Pain    HPI Audrey Weeks is a 58 y.o. female.     The history is provided by the patient.  Back Pain Pain location: right flank. Quality:  Aching Pain severity:  Moderate Onset quality:  Gradual Duration:  8 days Timing:  Constant Progression:  Worsening Chronicity:  New Context: falling   Relieved by:  Nothing Worsened by:  Movement Associated symptoms: no abdominal pain, no bladder incontinence, no fever, no headaches, no leg pain and no weakness   Associated symptoms comment:  Right sided chest wall pain  Patient with history of asthma presents after fall.  Patient reports [place].  She reports she slipped and fell hitting her head and landing on her right side.  She reports she sustained a concussion and has felt sore ever since.  However the past several days she has had worsening right-sided flank pain and spasms.  She is also having pain in her right side of her chest wall.  She reports her headache is resolved.  No neck pain.  No abdominal pain.  No new weakness.  She is concerned that her right flank pain means her kidneys are injured  Past Medical History:  Diagnosis Date  . Asthma   . Migraines     Patient Active Problem List   Diagnosis Date Noted  . OBESITY 05/10/2009  . ANXIETY 05/10/2009  . MIGRAINE HEADACHE 04/15/2009  . PNEUMONIA, COMMUNITY ACQUIRED, PNEUMOCOCCAL 04/15/2009  . ASTHMA 04/15/2009    Past Surgical History:  Procedure Laterality Date  . CESAREAN SECTION       OB History   No obstetric history on file.      Home Medications    Prior to Admission medications   Medication Sig Start Date End Date Taking? Authorizing Provider  albuterol (PROVENTIL HFA;VENTOLIN HFA) 108 (90 Base) MCG/ACT inhaler Inhale 2 puffs  into the lungs every 4 (four) hours as needed for wheezing or shortness of breath. 10/31/16   Joy, Shawn C, PA-C  ALPRAZolam (XANAX) 0.25 MG tablet Take 0.25 mg by mouth 2 (two) times daily as needed. 07/24/17   [provider]  diazepam (VALIUM) 5 MG tablet Take 1 tablet (5 mg total) by mouth every 12 (twelve) hours as needed for muscle spasms. 04/10/15   Antonietta Breach, PA-C  gabapentin (NEURONTIN) 600 MG tablet Take 600 mg by mouth daily. 07/25/17   [provider]  hydrOXYzine (ATARAX/VISTARIL) 25 MG tablet Take 1 tablet by mouth 3 (three) times daily. 06/23/17   [provider]  ibuprofen (ADVIL,MOTRIN) 800 MG tablet Take 800 mg by mouth daily as needed. 07/29/17   [provider]  oxyCODONE-acetaminophen (PERCOCET/ROXICET) 5-325 MG tablet Take 1-2 tablets by mouth every 6 (six) hours as needed for severe pain. Patient taking differently: Take 2 tablets by mouth every 6 (six) hours as needed for severe pain.  04/10/15   Antonietta Breach, PA-C  topiramate (TOPAMAX) 50 MG tablet Take 50 mg by mouth at bedtime as needed. 07/25/17   [provider]  traMADol (ULTRAM) 50 MG tablet Take 1 tablet (50 mg total) by mouth every 6 (six) hours as needed. 09/05/15   Pisciotta, Elmyra Ricks, PA-C    Family History Family History  Problem Relation Age of Onset  .  Hypertension Other   . Diabetes Other     Social History Social History   Tobacco Use  . Smoking status: Never Smoker  . Smokeless tobacco: Never Used  Substance Use Topics  . Alcohol use: No    Comment: occ  . Drug use: No     Allergies   Patient has no known allergies.   Review of Systems Review of Systems  Constitutional: Negative for fever.  Gastrointestinal: Negative for abdominal pain.  Genitourinary: Negative for bladder incontinence.  Musculoskeletal: Positive for back pain. Negative for neck pain.  Neurological: Negative for weakness and headaches.  All other systems reviewed and are negative.     Physical Exam Updated Vital Signs BP 108/77   Pulse 92   Temp 97.8 F (36.6 C)   Resp 18   Ht 1.588 m (5' 2.5")   Wt 104.3 kg   LMP 12/08/2011   SpO2 97%   BMI 41.40 kg/m   Physical Exam CONSTITUTIONAL: Well developed/well nourished HEAD: Normocephalic/atraumatic EYES: EOMI/PERRL ENMT: Mask in place NECK: supple no meningeal signs SPINE/BACK:entire spine nontender, lumbar paraspinal tenderness CV: S1/S2 noted, no murmurs/rubs/gallops noted LUNGS: Lungs are clear to auscultation bilaterally, no apparent distress Chest-mild right-sided chest wall tenderness without crepitus or bruising ABDOMEN: soft, nontender, no rebound or guarding, bowel sounds noted throughout abdomen GU: Tenderness to that right flank, no bruising noted NEURO: Pt is awake/alert/appropriate, moves all extremitiesx4.  No facial droop.  GCS 15.  No focal weakness noted in her extremities EXTREMITIES: pulses normal/equal, full ROM, all other extremities/joints palpated/ranged and nontender SKIN: warm, color normal PSYCH: no abnormalities of mood noted, alert and oriented to situation   ED Treatments / Results  Labs (all labs ordered are listed, but only abnormal results are displayed) Labs Reviewed  URINALYSIS, ROUTINE W REFLEX MICROSCOPIC - Abnormal; Notable for the following components:      Result Value   APPearance HAZY (*)    All other components within normal limits    EKG None  Radiology Dg Ribs Unilateral W/chest Right  Result Date: 10/27/2018 CLINICAL DATA:  Right flank/right pain. Fall 10/19/2018 (8 days ago) EXAM: RIGHT RIBS AND CHEST - 3+ VIEW COMPARISON:  None. FINDINGS: No fracture or other bone lesions are seen involving the ribs. There is no evidence of pneumothorax or pleural effusion. Both lungs are clear. Heart size and mediastinal contours are within normal limits. Aortic atherosclerosis. IMPRESSION: Negative radiographs of chest and right ribs. Electronically Signed   By:  Keith Rake M.D.   On: 10/27/2018 04:51   Dg Lumbar Spine Complete  Result Date: 10/27/2018 CLINICAL DATA:  Lumbosacral back pain. Fall 10/19/2018 (8 days ago). EXAM: LUMBAR SPINE - COMPLETE 4+ VIEW COMPARISON:  Lumbar spine radiograph 12/17/2013 FINDINGS: The alignment is maintained. Vertebral body heights are normal. There is no listhesis. The posterior elements are intact. Mild disc space narrowing and endplate spurring at 624THL. Facet hypertrophy at L4-L5 and L5-S1. No fracture. Sacroiliac joints are congruent. IMPRESSION: Mild degenerative change in the lumbar spine without acute abnormality. No fracture. Electronically Signed   By: Keith Rake M.D.   On: 10/27/2018 04:49    Procedures Procedures  Medications Ordered in ED Medications  HYDROmorphone (DILAUDID) injection 2 mg (2 mg Intramuscular Given 10/27/18 0343)     Initial Impression / Assessment and Plan / ED Course  I have reviewed the triage vital signs and the nursing notes.  Pertinent labs & imaging results that were available during my care of the patient  were reviewed by me and considered in my medical decision making (see chart for details).        4:02 AM Patient presents for evaluation of pain that was sustained approximately 8 days ago after a fall while hiking.  We will obtain lumbar spinal films due to location of pain as well as chest x-ray.  She reports she did sustain a concussion from hitting her head, but no headache at this time.  GCS is 15.  She was never evaluated after the fall until now.  However my suspicion for any acute head or C-spine injury is low.  Will also check urinalysis due to right flank pain.  She denies any urinary symptoms 5:22 AM Imaging is negative.  No signs of UTI/hematuria to suggest renal injury Pt ambulatory No other signs of acute trauma Will discharge home  Final Clinical Impressions(s) / ED Diagnoses   Final diagnoses:  Back strain, initial encounter  Blunt  trauma to chest, initial encounter    ED Discharge Orders    None       Ripley Fraise, MD 10/27/18 [phone]

## 2019-05-10 NOTE — Patient Instructions (Addendum)
DUE TO COVID-19 ONLY ONE VISITOR IS ALLOWED TO COME WITH YOU AND STAY IN THE WAITING ROOM ONLY DURING PRE OP AND PROCEDURE DAY OF SURGERY. THE 1 VISITOR MAY VISIT WITH YOU AFTER SURGERY IN YOUR PRIVATE ROOM DURING VISITING HOURS ONLY!  YOU NEED TO HAVE A COVID 19 TEST ON: 05/14/19 @  1:00 pm , THIS TEST MUST BE DONE BEFORE SURGERY, COME  Audrey Weeks, Audrey Weeks , [id number].  ([place]) ONCE YOUR COVID TEST IS COMPLETED, PLEASE BEGIN THE QUARANTINE INSTRUCTIONS AS OUTLINED IN YOUR HANDOUT.                Audrey Weeks    Your procedure is scheduled on: 05/18/19   Report to [facility] Main  Entrance   Report to short stay at: 5:30 AM     Call this number if you have problems the morning of surgery [phone]    Remember:    [place], NO [place].     Take these medicines the morning of surgery with A SIP OF WATER: gabapentin.Xanax as needed.  DO NOT TAKE ANY DIABETIC MEDICATIONS DAY OF YOUR SURGERY                               You may not have any metal on your body including hair pins and              piercings  Do not wear jewelry, make-up, lotions, powders or perfumes, deodorant             Do not wear nail polish on your fingernails.  Do not shave  48 hours prior to surgery.               Do not bring valuables to the hospital. [place].  Contacts, dentures or bridgework may not be worn into surgery.  Leave suitcase in the car. After surgery it may be brought to your room.     Patients discharged the day of surgery will not be allowed to drive home. IF YOU ARE HAVING SURGERY AND GOING HOME THE SAME DAY, YOU MUST HAVE AN ADULT TO DRIVE YOU HOME AND BE WITH YOU FOR 24 HOURS. YOU MAY GO HOME BY TAXI OR UBER OR ORTHERWISE, BUT AN ADULT MUST ACCOMPANY YOU HOME AND STAY WITH YOU FOR 24 HOURS.  Name and phone number of your  driver:  Special Instructions: N/A              Please read over the following fact sheets you were given: _____________________________________________________________________     NO SOLID FOOD AFTER MIDNIGHT THE NIGHT PRIOR TO SURGERY. NOTHING BY MOUTH EXCEPT CLEAR LIQUIDS UNTIL: 4:15 am . PLEASE FINISH GATORADE DRINK PER SURGEON ORDER  WHICH NEEDS TO BE COMPLETED AT: 4:15 am .   CLEAR LIQUID DIET   Foods Allowed  Foods Excluded  Coffee and tea, regular and decaf                             liquids that you cannot  Plain Jell-O any favor except red or purple                                           see through such as: Fruit ices (not with fruit pulp)                                     milk, soups, orange juice  Iced Popsicles                                    All solid food Carbonated beverages, regular and diet                                    Cranberry, grape and apple juices Sports drinks like Gatorade Lightly seasoned clear broth or consume(fat free) Sugar, honey syrup  Sample Menu Breakfast                                Lunch                                     Supper Cranberry juice                    Beef broth                            Chicken broth Jell-O                                     Grape juice                           Apple juice Coffee or tea                        Jell-O                                      Popsicle                                                Coffee or tea                        Coffee or tea  _____________________________________________________________________  [facility] - Preparing for Surgery Before surgery, you can play an important role.  Because skin is not sterile, your skin  needs to be as free of germs as possible.  You can reduce the number of germs on your skin by washing with CHG (chlorahexidine gluconate) soap before surgery.  CHG is an antiseptic  cleaner which kills germs and bonds with the skin to continue killing germs even after washing. Please DO NOT use if you have an allergy to CHG or antibacterial soaps.  If your skin becomes reddened/irritated stop using the CHG and inform your nurse when you arrive at Short Stay. Do not shave (including legs and underarms) for at least 48 hours prior to the first CHG shower.  You may shave your face/neck. Please follow these instructions carefully:  1.  Shower with CHG Soap the night before surgery and the  morning of Surgery.  2.  If you choose to wash your hair, wash your hair first as usual with your  normal  shampoo.  3.  After you shampoo, rinse your hair and body thoroughly to remove the  shampoo.                           4.  Use CHG as you would any other liquid soap.  You can apply chg directly  to the skin and wash                       Gently with a scrungie or clean washcloth.  5.  Apply the CHG Soap to your body ONLY FROM THE NECK DOWN.   Do not use on face/ open                           Wound or open sores. Avoid contact with eyes, ears mouth and genitals (private parts).                       Wash face,  Genitals (private parts) with your normal soap.             6.  Wash thoroughly, paying special attention to the area where your surgery  will be performed.  7.  Thoroughly rinse your body with warm water from the neck down.  8.  DO NOT shower/wash with your normal soap after using and rinsing off  the CHG Soap.                9.  Pat yourself dry with a clean towel.            10.  Wear clean pajamas.            11.  Place clean sheets on your bed the night of your first shower and do not  sleep with pets. Day of Surgery : Do not apply any lotions/deodorants the morning of surgery.  Please wear clean clothes to the hospital/surgery center.  FAILURE TO FOLLOW THESE INSTRUCTIONS MAY RESULT IN THE CANCELLATION OF YOUR SURGERY PATIENT  SIGNATURE_________________________________  NURSE SIGNATURE__________________________________  ________________________________________________________________________   [name]  An incentive spirometer is a tool that can help keep your lungs clear and active. This tool measures how well you are filling your lungs with each breath. Taking long deep breaths may help reverse or decrease the chance of developing breathing (pulmonary) problems (especially infection) following:  A long period of time when you are unable to move or be active. BEFORE THE PROCEDURE   If the spirometer includes an indicator to  show your best effort, your nurse or respiratory therapist will set it to a desired goal.  If possible, sit up straight or lean slightly forward. Try not to slouch.  Hold the incentive spirometer in an upright position. INSTRUCTIONS FOR USE  1. Sit on the edge of your bed if possible, or sit up as far as you can in bed or on a chair. 2. Hold the incentive spirometer in an upright position. 3. Breathe out normally. 4. Place the mouthpiece in your mouth and seal your lips tightly around it. 5. Breathe in slowly and as deeply as possible, raising the piston or the ball toward the top of the column. 6. Hold your breath for 3-5 seconds or for as long as possible. Allow the piston or ball to fall to the bottom of the column. 7. Remove the mouthpiece from your mouth and breathe out normally. 8. Rest for a few seconds and repeat Steps 1 through 7 at least 10 times every 1-2 hours when you are awake. Take your time and take a few normal breaths between deep breaths. 9. The spirometer may include an indicator to show your best effort. Use the indicator as a goal to work toward during each repetition. 10. After each set of 10 deep breaths, practice coughing to be sure your lungs are clear. If you have an incision (the cut made at the time of surgery), support your incision when coughing  by placing a pillow or rolled up towels firmly against it. Once you are able to get out of bed, walk around indoors and cough well. You may stop using the incentive spirometer when instructed by your caregiver.  RISKS AND COMPLICATIONS  Take your time so you do not get dizzy or light-headed.  If you are in pain, you may need to take or ask for pain medication before doing incentive spirometry. It is harder to take a deep breath if you are having pain. AFTER USE  Rest and breathe slowly and easily.  It can be helpful to keep track of a log of your progress. Your caregiver can provide you with a simple table to help with this. If you are using the spirometer at home, follow these instructions: [place] IF:   You are having difficultly using the spirometer.  You have trouble using the spirometer as often as instructed.  Your pain medication is not giving enough relief while using the spirometer.  You develop fever of 100.5 F (38.1 C) or higher. SEEK IMMEDIATE MEDICAL CARE IF:   You cough up bloody sputum that had not been present before.  You develop fever of 102 F (38.9 C) or greater.  You develop worsening pain at or near the incision site. MAKE SURE YOU:   Understand these instructions.  Will watch your condition.  Will get help right away if you are not doing well or get worse. Document Released: 05/06/2006 Document Revised: 03/18/2011 Document Reviewed: 07/07/2006 [facility] Patient Information 2014 [place], [place].   ________________________________________________________________________

## 2019-05-11 ENCOUNTER — Other Ambulatory Visit: Payer: Self-pay

## 2019-05-11 ENCOUNTER — Encounter (HOSPITAL_COMMUNITY): Payer: Self-pay

## 2019-05-11 ENCOUNTER — Encounter (HOSPITAL_COMMUNITY)
Admission: RE | Admit: 2019-05-11 | Discharge: 2019-05-11 | Disposition: A | Discharge status: Home / Self Care | Payer: Medicare Other | Source: Ambulatory Visit | Attending: Orthopedic Surgery | Admitting: Orthopedic Surgery

## 2019-05-11 DIAGNOSIS — Z01812 Encounter for preprocedural laboratory examination: Secondary | ICD-10-CM | POA: Diagnosis not present

## 2019-05-11 HISTORY — DX: Anxiety disorder, unspecified: F41.9

## 2019-05-11 HISTORY — DX: Unspecified osteoarthritis, unspecified site: M19.90

## 2019-05-11 HISTORY — DX: Pneumonia, unspecified organism: J18.9

## 2019-05-11 LAB — CBC WITH DIFFERENTIAL/PLATELET
Abs Immature Granulocytes: 0.02 10*3/uL (ref 0.00–0.07)
Basophils Absolute: 0.1 10*3/uL (ref 0.0–0.1)
Basophils Relative: 1 %
Eosinophils Absolute: 0.2 10*3/uL (ref 0.0–0.5)
Eosinophils Relative: 4 %
HCT: 40 % (ref 36.0–46.0)
Hemoglobin: 13 g/dL (ref 12.0–15.0)
Immature Granulocytes: 0 %
Lymphocytes Relative: 65 %
Lymphs Abs: 3.7 10*3/uL (ref 0.7–4.0)
MCH: 28.8 pg (ref 26.0–34.0)
MCHC: 32.5 g/dL (ref 30.0–36.0)
MCV: 88.5 fL (ref 80.0–100.0)
Monocytes Absolute: 0.3 10*3/uL (ref 0.1–1.0)
Monocytes Relative: 6 %
Neutro Abs: 1.3 10*3/uL — ABNORMAL LOW (ref 1.7–7.7)
Neutrophils Relative %: 24 %
Platelets: 308 10*3/uL (ref 150–400)
RBC: 4.52 MIL/uL (ref 3.87–5.11)
RDW: 14 % (ref 11.5–15.5)
WBC: 5.7 10*3/uL (ref 4.0–10.5)
nRBC: 0 % (ref 0.0–0.2)

## 2019-05-11 LAB — COMPREHENSIVE METABOLIC PANEL
ALT: 13 U/L (ref 0–44)
AST: 13 U/L — ABNORMAL LOW (ref 15–41)
Albumin: 4.2 g/dL (ref 3.5–5.0)
Alkaline Phosphatase: 58 U/L (ref 38–126)
Anion gap: 8 (ref 5–15)
BUN: 11 mg/dL (ref 6–20)
CO2: 22 mmol/L (ref 22–32)
Calcium: 9.5 mg/dL (ref 8.9–10.3)
Chloride: 112 mmol/L — ABNORMAL HIGH (ref 98–111)
Creatinine, Ser: 0.72 mg/dL (ref 0.44–1.00)
GFR calc Af Amer: >60 mL/min (ref >60)
GFR calc non Af Amer: >60 mL/min (ref >60)
Glucose, Bld: 90 mg/dL (ref 70–99)
Potassium: 4 mmol/L (ref 3.5–5.1)
Sodium: 142 mmol/L (ref 135–145)
Total Bilirubin: 0.4 mg/dL (ref 0.3–1.2)
Total Protein: 7.2 g/dL (ref 6.5–8.1)

## 2019-05-11 LAB — SURGICAL PCR SCREEN
MRSA, PCR: NEGATIVE
Staphylococcus aureus: POSITIVE — AB

## 2019-05-11 LAB — PROTIME-INR
INR: 0.9 (ref 0.8–1.2)
Prothrombin Time: 12 seconds (ref 11.4–15.2)

## 2019-05-11 LAB — APTT: aPTT: 29 seconds (ref 24–36)

## 2019-05-11 LAB — ABO/RH: ABO/RH(D): B POS

## 2019-05-11 NOTE — Progress Notes (Signed)
PCR results: positive for STAPH. 

## 2019-05-11 NOTE — Progress Notes (Signed)
PCP - Simona Huh NP. LOV: 10/2018 Cardiologist -   Chest x-ray -  EKG -  Stress Test -  ECHO -  Cardiac Cath -   Sleep Study - yes CPAP - no  Fasting Blood Sugar -  Checks Blood Sugar _____ times a day  Blood Thinner Instructions: Aspirin Instructions: Last Dose:  Anesthesia review:   Patient denies shortness of breath, fever, cough and chest pain at PAT appointment   Patient verbalized understanding of instructions that were given to them at the PAT appointment. Patient was also instructed that they will need to review over the PAT instructions again at home before surgery.

## 2019-05-14 NOTE — H&P (Signed)
TOTAL KNEE ADMISSION H&P  Patient is being admitted for right total knee arthroplasty.  Subjective:  Chief Complaint:   Right knee primary OA / pain.  HPI: Audrey Weeks, 59 y.o. female, has a history of pain and functional disability in the right knee due to arthritis and has failed non-surgical conservative treatments for greater than 12 weeks to include NSAID's and/or analgesics, corticosteriod injections, viscosupplementation injections and activity modification.  Onset of symptoms was gradual, starting 7 years ago with gradually worsening course since that time. The patient noted no past surgery on the right knee(s).  Patient currently rates pain in the right knee(s) at 10 out of 10 with activity. Patient has night pain, worsening of pain with activity and weight bearing, pain that interferes with activities of daily living, pain with passive range of motion, crepitus and joint swelling.  Patient has evidence of periarticular osteophytes and joint space narrowing by imaging studies. There is no active infection.  Risks, benefits and expectations were discussed with the patient.  Risks including but not limited to the risk of anesthesia, blood clots, nerve damage, blood vessel damage, failure of the prosthesis, infection and up to and including death.  Patient understand the risks, benefits and expectations and wishes to proceed with surgery.   PCP: Simona Huh, NP  D/C Plans:       Home  Post-op Meds:       No Rx given  Tranexamic Acid:      To be given - IV   Decadron:      Is to be given  FYI:      ASA  Oxycodone  (on pre-op)  DME:    Rx sent for - RW & 3-n-1  PT:   [facility] [place]: [place]     Patient Active Problem List   Diagnosis Date Noted  . OBESITY 05/10/2009  . ANXIETY 05/10/2009  . MIGRAINE HEADACHE 04/15/2009  . PNEUMONIA, COMMUNITY ACQUIRED, PNEUMOCOCCAL 04/15/2009  . ASTHMA 04/15/2009   Past Medical History:  Diagnosis Date  . Anxiety    . Arthritis   . Asthma   . Migraines   . Pneumonia     Past Surgical History:  Procedure Laterality Date  . CESAREAN SECTION      No current facility-administered medications for this encounter.   Current Outpatient Medications  Medication Sig Dispense Refill Last Dose  . ALPRAZolam (XANAX) 0.25 MG tablet Take 0.25 mg by mouth 2 (two) times daily as needed for anxiety.   0   . gabapentin (NEURONTIN) 600 MG tablet Take 600 mg by mouth daily.  2   . hydrOXYzine (ATARAX/VISTARIL) 25 MG tablet Take 1 tablet by mouth 3 (three) times daily.  0   . ibuprofen (ADVIL,MOTRIN) 800 MG tablet Take 800 mg by mouth daily as needed for moderate pain.   2   . OVER THE COUNTER MEDICATION Take 750 mg by mouth daily. [place]     . phentermine 37.5 MG capsule Take 37.5 mg by mouth every morning.     . Semaglutide, 1 MG/DOSE, (OZEMPIC, 1 MG/DOSE,) 2 MG/1.5ML SOPN Inject 1 mg into the skin every Friday.     . Suvorexant (BELSOMRA PO) Take 1 capsule by mouth at bedtime.     . topiramate (TOPAMAX) 50 MG tablet Take 50 mg by mouth at bedtime as needed (headaches).   0   . oxyCODONE-acetaminophen (PERCOCET) 10-325 MG tablet Take 1 tablet by mouth in the morning and at  bedtime.       No Known Allergies   Social History   Tobacco Use  . Smoking status: Never Smoker  . Smokeless tobacco: Never Used  Substance Use Topics  . Alcohol use: Not Currently    Comment: occ    Family History  Problem Relation Age of Onset  . Hypertension Other   . Diabetes Other      Review of Systems  Constitutional: Negative.   HENT: Negative.   Eyes: Negative.   Respiratory: Negative.   Cardiovascular: Negative.   Gastrointestinal: Negative.   Genitourinary: Negative.   Musculoskeletal: Positive for back pain and joint pain.  Skin: Negative.   Neurological: Positive for headaches.  Endo/Heme/Allergies: Negative.   Psychiatric/Behavioral: The patient is nervous/anxious.       Objective:  Physical Exam   Constitutional: She is oriented to person, place, and time. She appears well-developed.  HENT:  Head: Normocephalic.  Eyes: Pupils are equal, round, and reactive to light.  Neck: No JVD present. No tracheal deviation present. No thyromegaly present.  Cardiovascular: Normal rate, regular rhythm and intact distal pulses.  Respiratory: Effort normal and breath sounds normal. No respiratory distress. She has no wheezes.  GI: Soft. There is no abdominal tenderness. There is no guarding.  Musculoskeletal:     Cervical back: Neck supple.     Right knee: Swelling and bony tenderness present. No deformity, erythema, ecchymosis or lacerations. Tenderness present.  Lymphadenopathy:    She has no cervical adenopathy.  Neurological: She is alert and oriented to person, place, and time. A sensory deficit (tingling bilateral UEs) is present.  Skin: Skin is warm and dry.  Psychiatric: She has a normal mood and affect.      Labs:  Estimated body mass index is 38.88 kg/m as calculated from the following:   Height as of 05/11/19: 5' 2.5" (1.588 m).   Weight as of 05/11/19: 98 kg.   Imaging Review Plain radiographs demonstrate severe degenerative joint disease of the right knee. The bone quality appears to be good for age and reported activity level.      Assessment/Plan:  End stage arthritis, right knee   The patient history, physical examination, clinical judgment of the provider and imaging studies are consistent with end stage degenerative joint disease of the right knee(s) and total knee arthroplasty is deemed medically necessary. The treatment options including medical management, injection therapy arthroscopy and arthroplasty were discussed at length. The risks and benefits of total knee arthroplasty were presented and reviewed. The risks due to aseptic loosening, infection, stiffness, patella tracking problems, thromboembolic complications and other imponderables were discussed. The patient  acknowledged the explanation, agreed to proceed with the plan and consent was signed. Patient is being admitted for inpatient treatment for surgery, pain control, PT, OT, prophylactic antibiotics, VTE prophylaxis, progressive ambulation and ADL's and discharge planning. The patient is planning to be discharged home.     Patient's anticipated LOS is less than 2 midnights, meeting these requirements: - Younger than 17 - Lives within 1 hour of care - Has a competent adult at home to recover with post-op recover - NO history of  - Diabetes  - Coronary Artery Disease  - Heart failure  - Heart attack  - Stroke  - DVT/VTE  - Cardiac arrhythmia  - Respiratory Failure/COPD  - Renal failure  - Anemia   - Advanced Liver disease     [place] Pugh. Caleah Tortorelli   PA-C  05/14/2019, 2:12 PM

## 2019-05-17 ENCOUNTER — Other Ambulatory Visit (HOSPITAL_COMMUNITY)
Admission: RE | Admit: 2019-05-17 | Discharge: 2019-05-17 | Disposition: A | Discharge status: Home / Self Care | Payer: Medicare Other | Source: Ambulatory Visit | Attending: Orthopedic Surgery | Admitting: Orthopedic Surgery

## 2019-05-17 ENCOUNTER — Encounter (HOSPITAL_COMMUNITY): Payer: Self-pay | Admitting: Orthopedic Surgery

## 2019-05-17 DIAGNOSIS — Z20822 Contact with and (suspected) exposure to covid-19: Secondary | ICD-10-CM | POA: Insufficient documentation

## 2019-05-17 DIAGNOSIS — Z01812 Encounter for preprocedural laboratory examination: Secondary | ICD-10-CM | POA: Diagnosis present

## 2019-05-17 LAB — SARS CORONAVIRUS 2 (TAT 6-24 HRS): SARS Coronavirus 2: NEGATIVE

## 2019-05-18 ENCOUNTER — Encounter (HOSPITAL_COMMUNITY): Payer: Self-pay | Admitting: Orthopedic Surgery

## 2019-05-18 ENCOUNTER — Observation Stay (HOSPITAL_COMMUNITY)
Admission: RE | Admit: 2019-05-18 | Discharge: 2019-05-19 | Disposition: A | Discharge status: Home / Self Care | Payer: Medicare Other | Source: Ambulatory Visit | Attending: Orthopedic Surgery | Admitting: Orthopedic Surgery

## 2019-05-18 ENCOUNTER — Ambulatory Visit (HOSPITAL_COMMUNITY): Payer: Medicare Other | Admitting: Anesthesiology

## 2019-05-18 ENCOUNTER — Other Ambulatory Visit: Payer: Self-pay

## 2019-05-18 ENCOUNTER — Encounter (HOSPITAL_COMMUNITY)
Admission: RE | Disposition: A | Discharge status: Home / Self Care | Payer: Self-pay | Source: Ambulatory Visit | Attending: Orthopedic Surgery

## 2019-05-18 DIAGNOSIS — Z6839 Body mass index (BMI) 39.0-39.9, adult: Secondary | ICD-10-CM | POA: Insufficient documentation

## 2019-05-18 DIAGNOSIS — J45909 Unspecified asthma, uncomplicated: Secondary | ICD-10-CM | POA: Insufficient documentation

## 2019-05-18 DIAGNOSIS — Z79899 Other long term (current) drug therapy: Secondary | ICD-10-CM | POA: Diagnosis not present

## 2019-05-18 DIAGNOSIS — M1711 Unilateral primary osteoarthritis, right knee: Principal | ICD-10-CM | POA: Insufficient documentation

## 2019-05-18 DIAGNOSIS — E669 Obesity, unspecified: Secondary | ICD-10-CM | POA: Diagnosis not present

## 2019-05-18 DIAGNOSIS — F419 Anxiety disorder, unspecified: Secondary | ICD-10-CM | POA: Diagnosis not present

## 2019-05-18 DIAGNOSIS — Z96651 Presence of right artificial knee joint: Secondary | ICD-10-CM

## 2019-05-18 HISTORY — PX: TOTAL KNEE ARTHROPLASTY: SHX125

## 2019-05-18 LAB — TYPE AND SCREEN
ABO/RH(D): B POS
Antibody Screen: NEGATIVE

## 2019-05-18 SURGERY — ARTHROPLASTY, KNEE, TOTAL
Anesthesia: Regional | Site: Knee | Laterality: Right

## 2019-05-18 MED ORDER — ALPRAZOLAM 0.25 MG PO TABS: 0.2500 mg | ORAL_TABLET | Freq: Two times a day (BID) | ORAL | Status: DC | PRN

## 2019-05-18 MED ORDER — VANCOMYCIN HCL 1000 MG IV SOLR
INTRAVENOUS | Status: AC
  Filled 2019-05-18: qty 1000

## 2019-05-18 MED ORDER — TOBRAMYCIN SULFATE 1.2 G IJ SOLR
INTRAMUSCULAR | Status: AC
  Filled 2019-05-18: qty 1.2

## 2019-05-18 MED ORDER — CELECOXIB 200 MG PO CAPS
200.0000 mg | ORAL_CAPSULE | Freq: Two times a day (BID) | ORAL | Status: DC
  Administered 2019-05-18 – 2019-05-19 (×2): 200 mg via ORAL
  Filled 2019-05-18 (×2): qty 1

## 2019-05-18 MED ORDER — CEFAZOLIN SODIUM-DEXTROSE 2-4 GM/100ML-% IV SOLN
2.0000 g | Freq: Four times a day (QID) | INTRAVENOUS | Status: AC
  Administered 2019-05-18 (×2): 2 g via INTRAVENOUS
  Filled 2019-05-18 (×2): qty 100

## 2019-05-18 MED ORDER — ONDANSETRON HCL 4 MG PO TABS: 4.0000 mg | ORAL_TABLET | Freq: Four times a day (QID) | ORAL | Status: DC | PRN

## 2019-05-18 MED ORDER — SODIUM CHLORIDE 0.9 % IR SOLN
Status: DC | PRN
  Administered 2019-05-18: 1000 mL

## 2019-05-18 MED ORDER — MAGNESIUM CITRATE PO SOLN: 1.0000 | Freq: Once | ORAL | Status: DC | PRN

## 2019-05-18 MED ORDER — DEXAMETHASONE SODIUM PHOSPHATE 10 MG/ML IJ SOLN
INTRAMUSCULAR | Status: AC
  Filled 2019-05-18: qty 3

## 2019-05-18 MED ORDER — KETOROLAC TROMETHAMINE 30 MG/ML IJ SOLN
INTRAMUSCULAR | Status: AC
  Filled 2019-05-18: qty 1

## 2019-05-18 MED ORDER — PHENOL 1.4 % MT LIQD: 1.0000 | OROMUCOSAL | Status: DC | PRN

## 2019-05-18 MED ORDER — BISACODYL 10 MG RE SUPP: 10.0000 mg | Freq: Every day | RECTAL | Status: DC | PRN

## 2019-05-18 MED ORDER — LACTATED RINGERS IV SOLN: INTRAVENOUS | Status: DC

## 2019-05-18 MED ORDER — PROPOFOL 500 MG/50ML IV EMUL
INTRAVENOUS | Status: DC | PRN
Start: 2019-05-18 — End: 2019-05-18
  Administered 2019-05-18: 85 ug/kg/min via INTRAVENOUS

## 2019-05-18 MED ORDER — OXYCODONE HCL 5 MG PO TABS: 5.0000 mg | ORAL_TABLET | ORAL | Status: DC | PRN

## 2019-05-18 MED ORDER — MIDAZOLAM HCL 2 MG/2ML IJ SOLN
INTRAMUSCULAR | Status: AC
  Filled 2019-05-18: qty 2

## 2019-05-18 MED ORDER — TRANEXAMIC ACID-NACL 1000-0.7 MG/100ML-% IV SOLN
1000.0000 mg | Freq: Once | INTRAVENOUS | Status: AC
  Administered 2019-05-18: 1000 mg via INTRAVENOUS
  Filled 2019-05-18: qty 100

## 2019-05-18 MED ORDER — PHENYLEPHRINE HCL-NACL 10-0.9 MG/250ML-% IV SOLN
INTRAVENOUS | Status: DC | PRN
  Administered 2019-05-18: 25 ug/min via INTRAVENOUS

## 2019-05-18 MED ORDER — ACETAMINOPHEN 500 MG PO TABS: 1000.0000 mg | ORAL_TABLET | Freq: Once | ORAL | Status: DC

## 2019-05-18 MED ORDER — BUPIVACAINE HCL (PF) 0.25 % IJ SOLN
INTRAMUSCULAR | Status: DC | PRN
  Administered 2019-05-18: 30 mL

## 2019-05-18 MED ORDER — LIDOCAINE 2% (20 MG/ML) 5 ML SYRINGE
INTRAMUSCULAR | Status: DC | PRN
  Administered 2019-05-18: 60 mg via INTRAVENOUS

## 2019-05-18 MED ORDER — ONDANSETRON HCL 4 MG/2ML IJ SOLN
INTRAMUSCULAR | Status: DC | PRN
  Administered 2019-05-18: 4 mg via INTRAVENOUS

## 2019-05-18 MED ORDER — ROPIVACAINE HCL 5 MG/ML IJ SOLN
INTRAMUSCULAR | Status: DC | PRN
Start: 2019-05-18 — End: 2019-05-18
  Administered 2019-05-18: 20 mL via PERINEURAL

## 2019-05-18 MED ORDER — SODIUM CHLORIDE (PF) 0.9 % IJ SOLN
INTRAMUSCULAR | Status: DC | PRN
  Administered 2019-05-18: 30 mL

## 2019-05-18 MED ORDER — PROPOFOL 1000 MG/100ML IV EMUL
INTRAVENOUS | Status: AC
  Filled 2019-05-18: qty 300

## 2019-05-18 MED ORDER — TRANEXAMIC ACID-NACL 1000-0.7 MG/100ML-% IV SOLN
1000.0000 mg | INTRAVENOUS | Status: AC
  Administered 2019-05-18: 1000 mg via INTRAVENOUS
  Filled 2019-05-18: qty 100

## 2019-05-18 MED ORDER — CEFAZOLIN SODIUM-DEXTROSE 2-4 GM/100ML-% IV SOLN
2.0000 g | INTRAVENOUS | Status: AC
  Administered 2019-05-18: 2 g via INTRAVENOUS
  Filled 2019-05-18: qty 100

## 2019-05-18 MED ORDER — DEXAMETHASONE SODIUM PHOSPHATE 10 MG/ML IJ SOLN
INTRAMUSCULAR | Status: DC | PRN
Start: 2019-05-18 — End: 2019-05-18
  Administered 2019-05-18: 5 mg

## 2019-05-18 MED ORDER — 0.9 % SODIUM CHLORIDE (POUR BTL) OPTIME
TOPICAL | Status: DC | PRN
  Administered 2019-05-18: 1000 mL

## 2019-05-18 MED ORDER — FENTANYL CITRATE (PF) 100 MCG/2ML IJ SOLN
INTRAMUSCULAR | Status: AC
  Filled 2019-05-18: qty 2

## 2019-05-18 MED ORDER — ALUM & MAG HYDROXIDE-SIMETH 200-200-20 MG/5ML PO SUSP: 15.0000 mL | ORAL | Status: DC | PRN

## 2019-05-18 MED ORDER — METHOCARBAMOL 500 MG PO TABS
500.0000 mg | ORAL_TABLET | Freq: Four times a day (QID) | ORAL | Status: DC | PRN
  Administered 2019-05-18 – 2019-05-19 (×3): 500 mg via ORAL
  Filled 2019-05-18 (×3): qty 1

## 2019-05-18 MED ORDER — FERROUS SULFATE 325 (65 FE) MG PO TABS
325.0000 mg | ORAL_TABLET | Freq: Two times a day (BID) | ORAL | Status: DC
  Administered 2019-05-19: 08:00:00 325 mg via ORAL
  Filled 2019-05-18: qty 1

## 2019-05-18 MED ORDER — SODIUM CHLORIDE (PF) 0.9 % IJ SOLN
INTRAMUSCULAR | Status: AC
  Filled 2019-05-18: qty 50

## 2019-05-18 MED ORDER — BUPIVACAINE HCL 0.25 % IJ SOLN
INTRAMUSCULAR | Status: AC
  Filled 2019-05-18: qty 1

## 2019-05-18 MED ORDER — METHOCARBAMOL 500 MG IVPB - SIMPLE MED
500.0000 mg | Freq: Four times a day (QID) | INTRAVENOUS | Status: DC | PRN
  Administered 2019-05-18: 09:00:00 500 mg via INTRAVENOUS
  Filled 2019-05-18: qty 50

## 2019-05-18 MED ORDER — BUPIVACAINE IN DEXTROSE 0.75-8.25 % IT SOLN
INTRATHECAL | Status: DC | PRN
Start: 2019-05-18 — End: 2019-05-18
  Administered 2019-05-18: 1.6 mL via INTRATHECAL

## 2019-05-18 MED ORDER — TOPIRAMATE 25 MG PO TABS: 50.0000 mg | ORAL_TABLET | Freq: Every evening | ORAL | Status: DC | PRN

## 2019-05-18 MED ORDER — ACETAMINOPHEN 500 MG PO TABS
1000.0000 mg | ORAL_TABLET | Freq: Three times a day (TID) | ORAL | Status: AC
  Administered 2019-05-18 – 2019-05-19 (×4): 1000 mg via ORAL
  Filled 2019-05-18 (×4): qty 2

## 2019-05-18 MED ORDER — METHOCARBAMOL 500 MG IVPB - SIMPLE MED
INTRAVENOUS | Status: AC
  Filled 2019-05-18: qty 50

## 2019-05-18 MED ORDER — FENTANYL CITRATE (PF) 100 MCG/2ML IJ SOLN: 25.0000 ug | INTRAMUSCULAR | Status: DC | PRN

## 2019-05-18 MED ORDER — ONDANSETRON HCL 4 MG/2ML IJ SOLN
INTRAMUSCULAR | Status: AC
  Filled 2019-05-18: qty 6

## 2019-05-18 MED ORDER — METOCLOPRAMIDE HCL 5 MG PO TABS: 5.0000 mg | ORAL_TABLET | Freq: Three times a day (TID) | ORAL | Status: DC | PRN

## 2019-05-18 MED ORDER — PROPOFOL 10 MG/ML IV BOLUS
INTRAVENOUS | Status: DC | PRN
  Administered 2019-05-18: 50 mg via INTRAVENOUS

## 2019-05-18 MED ORDER — KETOROLAC TROMETHAMINE 30 MG/ML IJ SOLN
INTRAMUSCULAR | Status: DC | PRN
  Administered 2019-05-18: 30 mg via INTRA_ARTICULAR

## 2019-05-18 MED ORDER — SODIUM CHLORIDE 0.9 % IV SOLN: INTRAVENOUS | Status: DC

## 2019-05-18 MED ORDER — ACETAMINOPHEN 10 MG/ML IV SOLN
INTRAVENOUS | Status: DC | PRN
Start: 2019-05-18 — End: 2019-05-18
  Administered 2019-05-18: 1000 mg via INTRAVENOUS

## 2019-05-18 MED ORDER — METOCLOPRAMIDE HCL 5 MG/ML IJ SOLN: 5.0000 mg | Freq: Three times a day (TID) | INTRAMUSCULAR | Status: DC | PRN

## 2019-05-18 MED ORDER — DOCUSATE SODIUM 100 MG PO CAPS
100.0000 mg | ORAL_CAPSULE | Freq: Two times a day (BID) | ORAL | Status: DC
  Administered 2019-05-18 – 2019-05-19 (×2): 100 mg via ORAL
  Filled 2019-05-18 (×2): qty 1

## 2019-05-18 MED ORDER — FENTANYL CITRATE (PF) 100 MCG/2ML IJ SOLN
INTRAMUSCULAR | Status: DC | PRN
  Administered 2019-05-18 (×2): 50 ug via INTRAVENOUS

## 2019-05-18 MED ORDER — POLYETHYLENE GLYCOL 3350 17 G PO PACK
17.0000 g | PACK | Freq: Two times a day (BID) | ORAL | Status: DC
  Administered 2019-05-19: 08:00:00 17 g via ORAL
  Filled 2019-05-18: qty 1

## 2019-05-18 MED ORDER — GABAPENTIN 300 MG PO CAPS
600.0000 mg | ORAL_CAPSULE | Freq: Every day | ORAL | Status: DC
  Administered 2019-05-19: 08:00:00 600 mg via ORAL
  Filled 2019-05-18: qty 2

## 2019-05-18 MED ORDER — PROPOFOL 10 MG/ML IV BOLUS
INTRAVENOUS | Status: AC
  Filled 2019-05-18: qty 40

## 2019-05-18 MED ORDER — POVIDONE-IODINE 10 % EX SWAB
2.0000 "application " | Freq: Once | CUTANEOUS | Status: AC
  Administered 2019-05-18: 2 via TOPICAL

## 2019-05-18 MED ORDER — STERILE WATER FOR IRRIGATION IR SOLN
Status: DC | PRN
  Administered 2019-05-18: 2000 mL

## 2019-05-18 MED ORDER — HYDROMORPHONE HCL 1 MG/ML IJ SOLN
0.5000 mg | INTRAMUSCULAR | Status: DC | PRN
  Administered 2019-05-18 – 2019-05-19 (×2): 1 mg via INTRAVENOUS
  Filled 2019-05-18 (×2): qty 1

## 2019-05-18 MED ORDER — MIDAZOLAM HCL 5 MG/5ML IJ SOLN
INTRAMUSCULAR | Status: DC | PRN
  Administered 2019-05-18: 2 mg via INTRAVENOUS

## 2019-05-18 MED ORDER — BUPIVACAINE-EPINEPHRINE (PF) 0.25% -1:200000 IJ SOLN: INTRAMUSCULAR | Status: DC | PRN

## 2019-05-18 MED ORDER — MENTHOL 3 MG MT LOZG: 1.0000 | LOZENGE | OROMUCOSAL | Status: DC | PRN

## 2019-05-18 MED ORDER — ONDANSETRON HCL 4 MG/2ML IJ SOLN: 4.0000 mg | Freq: Four times a day (QID) | INTRAMUSCULAR | Status: DC | PRN

## 2019-05-18 MED ORDER — DEXAMETHASONE SODIUM PHOSPHATE 10 MG/ML IJ SOLN
10.0000 mg | Freq: Once | INTRAMUSCULAR | Status: AC
  Administered 2019-05-19: 10 mg via INTRAVENOUS
  Filled 2019-05-18: qty 1

## 2019-05-18 MED ORDER — ASPIRIN 81 MG PO CHEW
81.0000 mg | CHEWABLE_TABLET | Freq: Two times a day (BID) | ORAL | Status: DC
  Administered 2019-05-18 – 2019-05-19 (×2): 81 mg via ORAL
  Filled 2019-05-18 (×2): qty 1

## 2019-05-18 MED ORDER — DIPHENHYDRAMINE HCL 12.5 MG/5ML PO ELIX: 12.5000 mg | ORAL_SOLUTION | ORAL | Status: DC | PRN

## 2019-05-18 MED ORDER — PHENYLEPHRINE HCL-NACL 10-0.9 MG/250ML-% IV SOLN
INTRAVENOUS | Status: AC
  Filled 2019-05-18: qty 750

## 2019-05-18 MED ORDER — OXYCODONE HCL 5 MG PO TABS
10.0000 mg | ORAL_TABLET | ORAL | Status: DC | PRN
  Administered 2019-05-18 – 2019-05-19 (×6): 10 mg via ORAL
  Filled 2019-05-18 (×6): qty 2

## 2019-05-18 MED ORDER — PHENTERMINE HCL 37.5 MG PO CAPS: 37.5000 mg | ORAL_CAPSULE | Freq: Every day | ORAL | Status: DC

## 2019-05-18 MED ORDER — HYDROXYZINE HCL 25 MG PO TABS
25.0000 mg | ORAL_TABLET | Freq: Three times a day (TID) | ORAL | Status: DC
  Administered 2019-05-18 – 2019-05-19 (×4): 25 mg via ORAL
  Filled 2019-05-18 (×4): qty 1

## 2019-05-18 MED ORDER — DEXAMETHASONE SODIUM PHOSPHATE 10 MG/ML IJ SOLN
10.0000 mg | Freq: Once | INTRAMUSCULAR | Status: AC
  Administered 2019-05-18: 8 mg via INTRAVENOUS

## 2019-05-18 MED ORDER — LIDOCAINE 2% (20 MG/ML) 5 ML SYRINGE
INTRAMUSCULAR | Status: AC
  Filled 2019-05-18: qty 15

## 2019-05-18 MED ORDER — ACETAMINOPHEN 10 MG/ML IV SOLN
INTRAVENOUS | Status: AC
  Filled 2019-05-18: qty 100

## 2019-05-18 SURGICAL SUPPLY — 67 items
ADH SKN CLS APL DERMABOND .7 (GAUZE/BANDAGES/DRESSINGS) ×1
ATTUNE MED ANAT PAT 35 KNEE (Knees) ×1 IMPLANT
ATTUNE MED ANAT PAT 35MM KNEE (Knees) ×1 IMPLANT
ATTUNE PSFEM RTSZ4 NARCEM KNEE (Femur) ×2 IMPLANT
ATTUNE PSRP INSR SZ4 8 KNEE (Insert) ×1 IMPLANT
ATTUNE PSRP INSR SZ4 8MM KNEE (Insert) ×1 IMPLANT
BAG SPEC THK2 15X12 ZIP CLS (MISCELLANEOUS)
BAG ZIPLOCK 12X15 (MISCELLANEOUS) IMPLANT
BASEPLATE TIBIAL ROTATING SZ 4 (Knees) ×2 IMPLANT
BLADE SAW SGTL 11.0X1.19X90.0M (BLADE) IMPLANT
BLADE SAW SGTL 13.0X1.19X90.0M (BLADE) ×3 IMPLANT
BLADE SURG SZ10 CARB STEEL (BLADE) ×6 IMPLANT
BNDG CMPR MED 10X6 ELC LF (GAUZE/BANDAGES/DRESSINGS) ×1
BNDG ELASTIC 6X10 VLCR STRL LF (GAUZE/BANDAGES/DRESSINGS) ×2 IMPLANT
BNDG ELASTIC 6X5.8 VLCR STR LF (GAUZE/BANDAGES/DRESSINGS) ×3 IMPLANT
BOWL SMART MIX CTS (DISPOSABLE) ×3 IMPLANT
BSPLAT TIB 4 CMNT ROT PLAT STR (Knees) ×1 IMPLANT
CEMENT HV SMART SET (Cement) ×4 IMPLANT
COVER SURGICAL LIGHT HANDLE (MISCELLANEOUS) ×3 IMPLANT
COVER WAND RF STERILE (DRAPES) IMPLANT
CUFF TOURN SGL QUICK 34 (TOURNIQUET CUFF) ×3
CUFF TRNQT CYL 34X4.125X (TOURNIQUET CUFF) ×1 IMPLANT
DECANTER SPIKE VIAL GLASS SM (MISCELLANEOUS) ×6 IMPLANT
DERMABOND ADVANCED (GAUZE/BANDAGES/DRESSINGS) ×2
DERMABOND ADVANCED .7 DNX12 (GAUZE/BANDAGES/DRESSINGS) ×1 IMPLANT
DRAPE U-SHAPE 47X51 STRL (DRAPES) ×3 IMPLANT
DRESSING AQUACEL AG SP 3.5X10 (GAUZE/BANDAGES/DRESSINGS) ×1 IMPLANT
DRSG AQUACEL AG SP 3.5X10 (GAUZE/BANDAGES/DRESSINGS) ×3
DURAPREP 26ML APPLICATOR (WOUND CARE) ×6 IMPLANT
ELECT REM PT RETURN 15FT ADLT (MISCELLANEOUS) ×3 IMPLANT
GLOVE BIO SURGEON STRL SZ 6 (GLOVE) ×3 IMPLANT
GLOVE BIOGEL PI IND STRL 6.5 (GLOVE) ×1 IMPLANT
GLOVE BIOGEL PI IND STRL 7.5 (GLOVE) ×1 IMPLANT
GLOVE BIOGEL PI IND STRL 8.5 (GLOVE) ×1 IMPLANT
GLOVE BIOGEL PI INDICATOR 6.5 (GLOVE) ×2
GLOVE BIOGEL PI INDICATOR 7.5 (GLOVE) ×2
GLOVE BIOGEL PI INDICATOR 8.5 (GLOVE) ×2
GLOVE ECLIPSE 8.0 STRL XLNG CF (GLOVE) ×3 IMPLANT
GLOVE ORTHO TXT STRL SZ7.5 (GLOVE) ×3 IMPLANT
GOWN STRL REUS W/ TWL LRG LVL3 (GOWN DISPOSABLE) ×1 IMPLANT
GOWN STRL REUS W/TWL 2XL LVL3 (GOWN DISPOSABLE) ×3 IMPLANT
GOWN STRL REUS W/TWL LRG LVL3 (GOWN DISPOSABLE) ×6 IMPLANT
HANDPIECE INTERPULSE COAX TIP (DISPOSABLE) ×3
HOLDER FOLEY CATH W/STRAP (MISCELLANEOUS) IMPLANT
KIT TURNOVER KIT A (KITS) IMPLANT
MANIFOLD NEPTUNE II (INSTRUMENTS) ×3 IMPLANT
NDL SAFETY ECLIPSE 18X1.5 (NEEDLE) IMPLANT
NEEDLE HYPO 18GX1.5 SHARP (NEEDLE) ×3
NS IRRIG 1000ML POUR BTL (IV SOLUTION) ×3 IMPLANT
PACK TOTAL KNEE CUSTOM (KITS) ×3 IMPLANT
PENCIL SMOKE EVACUATOR (MISCELLANEOUS) ×2 IMPLANT
PIN DRILL FIX HALF THREAD (BIT) ×2 IMPLANT
PIN FIX SIGMA LCS THRD HI (PIN) ×2 IMPLANT
PROTECTOR NERVE ULNAR (MISCELLANEOUS) ×3 IMPLANT
SET HNDPC FAN SPRY TIP SCT (DISPOSABLE) ×1 IMPLANT
SET PAD KNEE POSITIONER (MISCELLANEOUS) ×3 IMPLANT
SUT MNCRL AB 4-0 PS2 18 (SUTURE) ×3 IMPLANT
SUT STRATAFIX PDS+ 0 24IN (SUTURE) ×3 IMPLANT
SUT VIC AB 1 CT1 36 (SUTURE) ×3 IMPLANT
SUT VIC AB 2-0 CT1 27 (SUTURE) ×9
SUT VIC AB 2-0 CT1 TAPERPNT 27 (SUTURE) ×3 IMPLANT
SYR 3ML LL SCALE MARK (SYRINGE) ×5 IMPLANT
TRAY FOLEY MTR SLVR 14FR STAT (SET/KITS/TRAYS/PACK) IMPLANT
TRAY FOLEY MTR SLVR 16FR STAT (SET/KITS/TRAYS/PACK) ×1 IMPLANT
WATER STERILE IRR 1000ML POUR (IV SOLUTION) ×6 IMPLANT
WRAP KNEE MAXI GEL POST OP (GAUZE/BANDAGES/DRESSINGS) ×3 IMPLANT
YANKAUER SUCT BULB TIP 10FT TU (MISCELLANEOUS) ×3 IMPLANT

## 2019-05-18 NOTE — Op Note (Signed)
NAME:  Audrey Weeks                      MEDICAL RECORD NO.:  [id number]                             FACILITY:  [facility]      PHYSICIAN:  Pietro Cassis. Alvan Dame, M.D.  DATE OF BIRTH:  1960/10/24      DATE OF PROCEDURE:  05/18/2019                                     OPERATIVE REPORT         PREOPERATIVE DIAGNOSIS:  Right knee osteoarthritis.      POSTOPERATIVE DIAGNOSIS:  Right knee osteoarthritis.      FINDINGS:  The patient was noted to have complete loss of cartilage and   bone-on-bone arthritis with associated osteophytes in the medial and patellofemoral compartments of   the knee with a significant synovitis and associated effusion.  The patient had failed months of conservative treatment including medications, injection therapy, activity modification.     PROCEDURE:  Right total knee replacement.      COMPONENTS USED:  DePuy Attune rotating platform posterior stabilized knee   system, a size 4N femur, 4 tibia, size 8 mm PS AOX insert, and 35 anatomic patellar   button.      SURGEON:  Pietro Cassis. Alvan Dame, M.D.      ASSISTANT:  Danae Orleans, PA-C.      ANESTHESIA:  Regional and Spinal.      SPECIMENS:  None.      COMPLICATION:  None.      DRAINS:  None.  EBL: <100cc      TOURNIQUET TIME:   Total Tourniquet Time Documented: Thigh (Right) - 25 minutes Total: Thigh (Right) - 25 minutes      The patient was stable to the recovery room.      INDICATION FOR PROCEDURE:  Audrey Weeks is a 59 y.o. female patient of   mine.  The patient had been seen, evaluated, and treated for months conservatively in the   office with medication, activity modification, and injections.  The patient had   radiographic changes of bone-on-bone arthritis with endplate sclerosis and osteophytes noted.  Based on the radiographic changes and failed conservative measures, the patient   decided to proceed with definitive treatment, total knee replacement.  Risks of infection, DVT, component  failure, need for revision surgery, neurovascular injury were reviewed in the office setting.  The postop course was reviewed stressing the efforts to maximize post-operative satisfaction and function.  Consent was obtained for benefit of pain   relief.      PROCEDURE IN DETAIL:  The patient was brought to the operative theater.   Once adequate anesthesia, preoperative antibiotics, 2 gm of Ancef,1 gm of Tranexamic Acid, and 10 mg of Decadron administered, the patient was positioned supine with a right thigh tourniquet placed.  The  right lower extremity was prepped and draped in sterile fashion.  A time-   out was performed identifying the patient, planned procedure, and the appropriate extremity.      The right lower extremity was placed in the [facility] leg holder.  The leg was   exsanguinated, tourniquet elevated to 250 mmHg.  A midline incision was   made followed by  median parapatellar arthrotomy.  Following initial   exposure, attention was first directed to the patella.  Precut   measurement was noted to be 23 mm.  I resected down to 13 mm and used a   35 anatomic patellar button to restore patellar height as well as cover the cut surface.      The lug holes were drilled and a metal shim was placed to protect the   patella from retractors and saw blade during the procedure.      At this point, attention was now directed to the femur.  The femoral   canal was opened with a drill, irrigated to try to prevent fat emboli.  An   intramedullary rod was passed at 3 degrees valgus, 9 mm of bone was   resected off the distal femur.  Following this resection, the tibia was   subluxated anteriorly.  Using the extramedullary guide, 2 mm of bone was resected off   the proximal medial tibia.  We confirmed the gap would be   stable medially and laterally with a size 5 spacer block as well as confirmed that the tibial cut was perpendicular in the coronal plane, checking with an alignment rod.      Once  this was done, I sized the femur to be a size 4 in the anterior-   posterior dimension, chose a narrow component based on medial and   lateral dimension.  The size 4 rotation block was then pinned in   position anterior referenced using the C-clamp to set rotation.  The   anterior, posterior, and  chamfer cuts were made without difficulty nor   notching making certain that I was along the anterior cortex to help   with flexion gap stability.      The final box cut was made off the lateral aspect of distal femur.      At this point, the tibia was sized to be a size 4.  The size 4 tray was   then pinned in position through the medial third of the tubercle,   drilled, and keel punched.  Trial reduction was now carried with a 4 femur,  4 tibia, a size 8 mm PS insert, and the 35 anatomic patella botton.  The knee was brought to full extension with good flexion stability with the patella   tracking through the trochlea without application of pressure.  Given   all these findings the trial components removed.  Final components were   opened and cement was mixed.  The knee was irrigated with normal saline solution and pulse lavage.  The synovial lining was   then injected with 30 cc of 0.25% Marcaine with epinephrine, 1 cc of Toradol and 30 cc of NS for a total of 61 cc.     Final implants were then cemented onto cleaned and dried cut surfaces of bone with the knee brought to extension with a size 8 mm PS trial insert.      Once the cement had fully cured, excess cement was removed   throughout the knee.  I confirmed that I was satisfied with the range of   motion and stability, and the final size 8 mm PS AOX insert was chosen.  It was   placed into the knee.      The tourniquet had been let down at 25 minutes.  No significant   hemostasis was required.  The extensor mechanism was then reapproximated using #1 Vicryl and #1 Stratafix  sutures with the knee   in flexion.  The   remaining wound was  closed with 2-0 Vicryl and running 4-0 Monocryl.   The knee was cleaned, dried, dressed sterilely using Dermabond and   Aquacel dressing.  The patient was then   brought to recovery room in stable condition, tolerating the procedure   well.   Please note that Physician Assistant, Danae Orleans, PA-C was present for the entirety of the case, and was utilized for pre-operative positioning, peri-operative retractor management, general facilitation of the procedure and for primary wound closure at the end of the case.              Pietro Cassis Alvan Dame, M.D.    05/18/2019 8:45 AM

## 2019-05-18 NOTE — Plan of Care (Signed)
Plan of care for post op day 0 discussed with patient and husband. Will continue education.   Patient needs DME: RW and 3in1. Patient is 5'2" and 98kg.     Will continue to monitor patient.    [place], Therapist, sports

## 2019-05-18 NOTE — Anesthesia Postprocedure Evaluation (Signed)
Anesthesia Post Note  Patient: Mailin D Mcsweeney  Procedure(s) Performed: TOTAL KNEE ARTHROPLASTY (Right Knee)     Patient location during evaluation: PACU Anesthesia Type: Regional and Spinal Level of consciousness: oriented and awake and alert Pain management: pain level controlled Vital Signs Assessment: post-procedure vital signs reviewed and stable Respiratory status: spontaneous breathing, respiratory function stable and patient connected to nasal cannula oxygen Cardiovascular status: blood pressure returned to baseline and stable Postop Assessment: no headache, no backache and no apparent nausea or vomiting Anesthetic complications: no    Last Vitals:  Vitals:   05/18/19 1252 05/18/19 1352  BP: 126/77 120/81  Pulse: 81 85  Resp: 17 18  Temp: (!) 36.4 C 36.4 C  SpO2: 100% 100%    Last Pain:  Vitals:   05/18/19 1452  TempSrc:   PainSc: Asleep                 Callahan Peddie L Steve Youngberg

## 2019-05-18 NOTE — Anesthesia Procedure Notes (Signed)
Spinal  Patient location during procedure: OR Start time: 05/18/2019 7:15 AM End time: 05/18/2019 7:25 AM Staffing Performed: resident/CRNA  Resident/CRNA: Lavina Hamman, CRNA Preanesthetic Checklist Completed: patient identified, IV checked, site marked, risks and benefits discussed, surgical consent, monitors and equipment checked, pre-op evaluation and timeout performed Spinal Block Patient position: sitting Prep: DuraPrep Patient monitoring: heart rate, cardiac monitor, continuous pulse ox and blood pressure Approach: midline Location: L3-4 Injection technique: single-shot Needle Needle type: Sprotte  Needle gauge: 24 G Needle length: 9 cm Needle insertion depth: 8 cm Assessment Sensory level: T4 Additional Notes IV functioning, monitors applied to pt. Expiration date of kit checked and confirmed to be in date. Sterile prep and drape, hand hygiene and sterile gloved used. Pt was positioned and spine was prepped in sterile fashion. Skin was anesthetized with lidocaine. Free flow of clear CSF obtained prior to injecting local anesthetic into CSF x 1 attempt. Spinal needle aspirated freely following injection. Needle was carefully withdrawn, and pt tolerated procedure well. Loss of motor and sensory on exam post injection.

## 2019-05-18 NOTE — Transfer of Care (Signed)
Immediate Anesthesia Transfer of Care Note  Patient: Audrey Weeks  Procedure(s) Performed: Procedure(s) with comments: TOTAL KNEE ARTHROPLASTY (Right) - 74mins  Patient Location: PACU  Anesthesia Type:General  Level of Consciousness:  sedated, patient cooperative and responds to stimulation  Airway & Oxygen Therapy:Patient Spontanous Breathing and Patient connected to face mask oxgen  Post-op Assessment:  Report given to PACU RN and Post -op Vital signs reviewed and stable  Post vital signs:  Reviewed and stable  Last Vitals:  Vitals:   05/18/19 0558  BP: (!) 123/92  Pulse: 75  Resp: 16  Temp: 36.6 C  SpO2: 123XX123    Complications: No apparent anesthesia complications

## 2019-05-18 NOTE — Anesthesia Preprocedure Evaluation (Addendum)
Anesthesia Evaluation  Patient identified by MRN, date of birth, ID band Patient awake    Reviewed: Allergy & Precautions, NPO status , Patient's Chart, lab work & pertinent test results  Airway Mallampati: III  TM Distance: >3 FB Neck ROM: Full    Dental no notable dental hx. (+) Teeth Intact, Dental Advisory Given   Pulmonary asthma ,    Pulmonary exam normal breath sounds clear to auscultation       Cardiovascular negative cardio ROS Normal cardiovascular exam Rhythm:Regular Rate:Normal     Neuro/Psych  Headaches, Anxiety negative psych ROS   GI/Hepatic negative GI ROS, Neg liver ROS,   Endo/Other  negative endocrine ROSObese BMI 39  Renal/GU negative Renal ROS  negative genitourinary   Musculoskeletal  (+) Arthritis ,   Abdominal   Peds  Hematology negative hematology ROS (+)   Anesthesia Other Findings   Reproductive/Obstetrics                            Anesthesia Physical Anesthesia Plan  ASA: III  Anesthesia Plan: Spinal and Regional   Post-op Pain Management:  Regional for Post-op pain   Induction:   PONV Risk Score and Plan: 2 and Treatment may vary due to age or medical condition, Propofol infusion, Midazolam and Ondansetron  Airway Management Planned: Natural Airway  Additional Equipment:   Intra-op Plan:   Post-operative Plan:   Informed Consent: I have reviewed the patients History and Physical, chart, labs and discussed the procedure including the risks, benefits and alternatives for the proposed anesthesia with the patient or authorized representative who has indicated his/her understanding and acceptance.     Dental advisory given  Plan Discussed with: CRNA  Anesthesia Plan Comments:         Anesthesia Quick Evaluation

## 2019-05-18 NOTE — Interval H&P Note (Signed)
History and Physical Interval Note:  05/18/2019 6:57 AM  Audrey Weeks  has presented today for surgery, with the diagnosis of Right knee osteoarthritis.  The various methods of treatment have been discussed with the patient and family. After consideration of risks, benefits and other options for treatment, the patient has consented to  Procedure(s) with comments: TOTAL KNEE ARTHROPLASTY (Right) - 36mins as a surgical intervention.  The patient's history has been reviewed, patient examined, no change in status, stable for surgery.  I have reviewed the patient's chart and labs.  Questions were answered to the patient's satisfaction.     Mauri Pole

## 2019-05-18 NOTE — Plan of Care (Signed)
  Problem: Education: Goal: Knowledge of the prescribed therapeutic regimen will improve Outcome: Progressing Goal: Individualized Educational Video(s) Outcome: Progressing   Problem: Activity: Goal: Ability to avoid complications of mobility impairment will improve Outcome: Progressing Goal: Range of joint motion will improve Outcome: Progressing   Problem: Clinical Measurements: Goal: Postoperative complications will be avoided or minimized Outcome: Progressing   Problem: Pain Management: Goal: Pain level will decrease with appropriate interventions Outcome: Progressing   Problem: Skin Integrity: Goal: Will show signs of wound healing Outcome: Progressing   Problem: Clinical Measurements: Goal: Ability to maintain clinical measurements within normal limits will improve Outcome: Progressing Goal: Will remain free from infection Outcome: Progressing Goal: Diagnostic test results will improve Outcome: Progressing Goal: Respiratory complications will improve Outcome: Progressing Goal: Cardiovascular complication will be avoided Outcome: Progressing   Problem: Nutrition: Goal: Adequate nutrition will be maintained Outcome: Progressing   Problem: Elimination: Goal: Will not experience complications related to bowel motility Outcome: Progressing Goal: Will not experience complications related to urinary retention Outcome: Progressing

## 2019-05-18 NOTE — Anesthesia Procedure Notes (Signed)
Anesthesia Regional Block: Adductor canal block   Pre-Anesthetic Checklist: ,, timeout performed, Correct Patient, Correct Site, Correct Laterality, Correct Procedure, Correct Position, site marked, Risks and benefits discussed,  Surgical consent,  Pre-op evaluation,  At surgeon's request and post-op pain management  Laterality: Right  Prep: Maximum Sterile Barrier Precautions used, chloraprep       Needles:  Injection technique: Single-shot  Needle Type: Echogenic Stimulator Needle     Needle Length: 9cm  Needle Gauge: 22     Additional Needles:   Procedures:,,,, ultrasound used (permanent image in chart),,,,  Narrative:  Start time: 05/18/2019 6:50 AM End time: 05/18/2019 7:00 AM Injection made incrementally with aspirations every 5 mL.  Performed by: Personally  Anesthesiologist: Freddrick March, MD  Additional Notes: Monitors applied. No increased pain on injection. No increased resistance to injection. Injection made in 5cc increments. Good needle visualization. Patient tolerated procedure well.

## 2019-05-18 NOTE — Evaluation (Signed)
Physical Therapy Evaluation Patient Details Name: Audrey Weeks MRN: [id number] DOB: May 03, 1960 Today's Date: 05/18/2019   History of Present Illness  Patient is 59 y.o. female s/p Rt TKA on 05/18/19 with PMH significant for migraines, asthma, OA, anxiety.  Clinical Impression  Lissette D Criss is a 59 y.o. female POD 0 s/p Rt TKA. Patient reports independence with mobility at baseline. Patient is now limited by functional impairments (see PT problem list below) and requires min-mod assist for transfers and gait with RW. Patient was able to ambulate ~6 feet with RW and mod assist to move from [facility] to recliner in room. Patient was limited by pain for further ambulation. Patient instructed in exercise to facilitate circulation however was becoming drowsy at EOS and required repeated cues to complete ankle pump exercise. Patient will benefit from continued skilled PT interventions to address impairments and progress towards PLOF. Acute PT will follow to progress mobility and stair training in preparation for safe discharge home.     Follow Up Recommendations Follow surgeon's recommendation for DC plan and follow-up therapies    Equipment Recommendations  Rolling walker with 5" wheels;3in1 (PT)    Recommendations for Other Services       Precautions / Restrictions Precautions Precautions: Fall Restrictions Weight Bearing Restrictions: No      Mobility  Bed Mobility Overal bed mobility: Needs Assistance Bed Mobility: Supine to Sit     Supine to sit: Min assist;HOB elevated     General bed mobility comments: cues for sequencing and use of bed rail, assist to raise trunk upright.  Transfers Overall transfer level: Needs assistance Equipment used: Rolling walker (2 wheeled) Transfers: Sit to/from [facility] Sit to Stand: Min assist;Mod assist Stand pivot transfers: Min assist;Mod assist       General transfer comment: cues for hand placement/technique with  RW. mod assist required for power up and to steady with rising. manual blocking at Rt knee to facilitate extension during stand with stand step/pivot transfer from bed to [facility].  Ambulation/Gait Ambulation/Gait assistance: Min assist;Mod assist Gait Distance (Feet): 6 Feet Assistive device: Rolling walker (2 wheeled) Gait Pattern/deviations: Step-to pattern;Decreased stance time - right;Decreased weight shift to right Gait velocity: decreased   General Gait Details: cues for safe walker management/proximity. manual facilitation at Rt knee to prevent buckling in stance. pt took small steps backward after BSC removed to step towards recliner and sit.   Stairs         Wheelchair Mobility    Modified Rankin (Stroke Patients Only)       Balance Overall balance assessment: Needs assistance Sitting-balance support: Feet supported Sitting balance-Leahy Scale: Fair     Standing balance support: During functional activity;Bilateral upper extremity supported Standing balance-Leahy Scale: Poor              Pertinent Vitals/Pain Pain Assessment: Faces Faces Pain Scale: Hurts whole lot Pain Location: Rt knee Pain Descriptors / Indicators: Grimacing;Guarding;Moaning Pain Intervention(s): Limited activity within patient's tolerance;Monitored during session;Repositioned;Ice applied    Home Living Family/patient expects to be discharged to:: Private residence Living Arrangements: Spouse/significant other Available Help at Discharge: Family Type of Home: House Home Access: Stairs to enter Entrance Stairs-Rails: Can reach both Entrance Stairs-Number of Steps: 6 Home Layout: One level Home Equipment: [place] - single point      Prior Function Level of Independence: Independent               Hand Dominance   Dominant Hand: Right  Extremity/Trunk Assessment   Upper Extremity Assessment Upper Extremity Assessment: Overall WFL for tasks assessed    Lower Extremity  Assessment Lower Extremity Assessment: RLE deficits/detail RLE Deficits / Details: pt limited by pain with testing. able to complete SLR at limited height.  RLE: Unable to fully assess due to pain RLE Sensation: WNL RLE Coordination: WNL    Cervical / Trunk Assessment Cervical / Trunk Assessment: Normal  Communication   Communication: No difficulties  Cognition Arousal/Alertness: Awake/alert Behavior During Therapy: WFL for tasks assessed/performed Overall Cognitive Status: Within Functional Limits for tasks assessed        General Comments: pt slightly drowsy at EOS      General Comments      Exercises Total Joint Exercises Ankle Circles/Pumps: AROM;Both;10 reps;Seated(pt drowsy required cues to complete)   Assessment/Plan    PT Assessment Patient needs continued PT services  PT Problem List Decreased strength;Decreased range of motion;Decreased activity tolerance;Decreased balance;Decreased mobility;Decreased knowledge of use of DME;Obesity;Pain       PT Treatment Interventions DME instruction;Gait training;Stair training;Functional mobility training;Therapeutic activities;Balance training;Therapeutic exercise;Patient/family education    PT Goals (Current goals can be found in the Care Plan section)  Acute Rehab PT Goals Patient Stated Goal: to go home PT Goal Formulation: With patient Time For Goal Achievement: 05/18/19 Potential to Achieve Goals: Good    Frequency 7X/week    AM-PAC PT "6 Clicks" Mobility  Outcome Measure Help needed turning from your back to your side while in a flat bed without using bedrails?: A Little Help needed moving from lying on your back to sitting on the side of a flat bed without using bedrails?: A Little Help needed moving to and from a bed to a chair (including a wheelchair)?: A Lot Help needed standing up from a chair using your arms (e.g., wheelchair or bedside chair)?: A Lot Help needed to walk in hospital room?: A Lot Help  needed climbing 3-5 steps with a railing? : A Lot 6 Click Score: 14    End of Session Equipment Utilized During Treatment: Gait belt Activity Tolerance: Patient tolerated treatment well Patient left: in chair;with call bell/phone within reach;with chair alarm set Nurse Communication: Mobility status PT Visit Diagnosis: Muscle weakness (generalized) (M62.81);Difficulty in walking, not elsewhere classified (R26.2);Pain Pain - Right/Left: Right Pain - part of body: Knee    Time: 1355-1424 PT Time Calculation (min) (ACUTE ONLY): 29 min   Charges:   PT Evaluation $PT Eval Low Complexity: 1 Low PT Treatments $Therapeutic Activity: 8-22 mins        Verner Mould, DPT Physical Therapist with [facility] [employer] [phone]  05/18/2019 3:21 PM

## 2019-05-19 ENCOUNTER — Encounter: Payer: Self-pay | Admitting: *Deleted

## 2019-05-19 DIAGNOSIS — M1711 Unilateral primary osteoarthritis, right knee: Secondary | ICD-10-CM | POA: Diagnosis not present

## 2019-05-19 LAB — BASIC METABOLIC PANEL
Anion gap: 10 (ref 5–15)
BUN: 12 mg/dL (ref 6–20)
CO2: 21 mmol/L — ABNORMAL LOW (ref 22–32)
Calcium: 8.9 mg/dL (ref 8.9–10.3)
Chloride: 105 mmol/L (ref 98–111)
Creatinine, Ser: 0.88 mg/dL (ref 0.44–1.00)
GFR calc Af Amer: >60 mL/min (ref >60)
GFR calc non Af Amer: >60 mL/min (ref >60)
Glucose, Bld: 200 mg/dL — ABNORMAL HIGH (ref 70–99)
Potassium: 4.2 mmol/L (ref 3.5–5.1)
Sodium: 136 mmol/L (ref 135–145)

## 2019-05-19 LAB — CBC
HCT: 34.9 % — ABNORMAL LOW (ref 36.0–46.0)
Hemoglobin: 11.8 g/dL — ABNORMAL LOW (ref 12.0–15.0)
MCH: 29.6 pg (ref 26.0–34.0)
MCHC: 33.8 g/dL (ref 30.0–36.0)
MCV: 87.7 fL (ref 80.0–100.0)
Platelets: 303 10*3/uL (ref 150–400)
RBC: 3.98 MIL/uL (ref 3.87–5.11)
RDW: 13.9 % (ref 11.5–15.5)
WBC: 14.3 10*3/uL — ABNORMAL HIGH (ref 4.0–10.5)
nRBC: 0 % (ref 0.0–0.2)

## 2019-05-19 MED ORDER — ASPIRIN 81 MG PO CHEW: 81.0000 mg | CHEWABLE_TABLET | Freq: Two times a day (BID) | ORAL | 0 refills | Status: AC

## 2019-05-19 MED ORDER — ACETAMINOPHEN 500 MG PO TABS
1000.0000 mg | ORAL_TABLET | Freq: Three times a day (TID) | ORAL | 0 refills | Status: DC
Start: 2019-05-19 — End: 2021-06-05

## 2019-05-19 MED ORDER — CELECOXIB 200 MG PO CAPS
200.0000 mg | ORAL_CAPSULE | Freq: Two times a day (BID) | ORAL | 0 refills | Status: DC
Start: 2019-05-19 — End: 2020-10-04

## 2019-05-19 MED ORDER — POLYETHYLENE GLYCOL 3350 17 G PO PACK
17.0000 g | PACK | Freq: Two times a day (BID) | ORAL | 0 refills | Status: DC
Start: 2019-05-19 — End: 2021-06-05

## 2019-05-19 MED ORDER — DOCUSATE SODIUM 100 MG PO CAPS
100.0000 mg | ORAL_CAPSULE | Freq: Two times a day (BID) | ORAL | 0 refills | Status: DC
Start: 2019-05-19 — End: 2021-03-13

## 2019-05-19 MED ORDER — OXYCODONE HCL 5 MG PO TABS: 5.0000 mg | ORAL_TABLET | ORAL | 0 refills | Status: DC | PRN

## 2019-05-19 MED ORDER — METHOCARBAMOL 500 MG PO TABS: 500.0000 mg | ORAL_TABLET | Freq: Four times a day (QID) | ORAL | 0 refills | Status: DC | PRN

## 2019-05-19 MED ORDER — FERROUS SULFATE 325 (65 FE) MG PO TABS: 325.0000 mg | ORAL_TABLET | Freq: Three times a day (TID) | ORAL | 0 refills | Status: DC

## 2019-05-19 NOTE — Progress Notes (Signed)
Physical Therapy Treatment Patient Details Name: Audrey Weeks MRN: [id number] DOB: [birth year] Today's Date: 05/19/2019    History of Present Illness Patient is 59 y.o. female s/p Rt TKA on 05/18/19 with PMH significant for migraines, asthma, OA, anxiety.    PT Comments    Progressing with mobility. Pt was bit drowsy during session. Will plan to have a 2nd session to practice stair negotiation prior to possible d/c home.    Follow Up Recommendations  Follow surgeon's recommendation for DC plan and follow-up therapies     Equipment Recommendations  Rolling walker with 5" wheels;3in1 (PT)    Recommendations for Other Services       Precautions / Restrictions Precautions Precautions: Fall Restrictions Weight Bearing Restrictions: No Other Position/Activity Restrictions: WBAT    Mobility  Bed Mobility Overal bed mobility: Needs Assistance Bed Mobility: Supine to Sit     Supine to sit: Min assist;HOB elevated     General bed mobility comments: Assist for R LE  Transfers Overall transfer level: Needs assistance Equipment used: Rolling walker (2 wheeled) Transfers: Sit to/from [facility] Sit to Stand: Min guard         General transfer comment: Cues for safety, hand placement. MIn guard for safety.  Ambulation/Gait Ambulation/Gait assistance: Min guard Gait Distance (Feet): 75 Feet Assistive device: Rolling walker (2 wheeled) Gait Pattern/deviations: Step-to pattern;Step-through pattern;Decreased stride length     General Gait Details: Slow gait speed. Distance limited by pain. VCs safety, sequence.   Stairs             Wheelchair Mobility    Modified Rankin (Stroke Patients Only)       Balance Overall balance assessment: Needs assistance         Standing balance support: Bilateral upper extremity supported Standing balance-Leahy Scale: Poor                              Cognition Arousal/Alertness:  Awake/alert Behavior During Therapy: WFL for tasks assessed/performed Overall Cognitive Status: Within Functional Limits for tasks assessed                                        Exercises Total Joint Exercises Ankle Circles/Pumps: AROM;Both;10 reps Quad Sets: AROM;Both;10 reps Heel Slides: AAROM;Right;10 reps Hip ABduction/ADduction: AAROM;Right;10 reps Straight Leg Raises: AAROM;Right;10 reps Goniometric ROM: ~10-70 degrees    General Comments        Pertinent Vitals/Pain Pain Assessment: 0-10 Faces Pain Scale: Hurts even more Pain Location: Rt knee Pain Descriptors / Indicators: Grimacing;Guarding;Moaning Pain Intervention(s): Monitored during session;Limited activity within patient's tolerance;Ice applied;Repositioned    Home Living                      Prior Function            PT Goals (current goals can now be found in the care plan section) Progress towards PT goals: Progressing toward goals    Frequency    7X/week      PT Plan Current plan remains appropriate    Co-evaluation              AM-PAC PT "6 Clicks" Mobility   Outcome Measure  Help needed turning from your back to your side while in a flat bed without using bedrails?: A Little Help needed moving from lying on  your back to sitting on the side of a flat bed without using bedrails?: A Little Help needed moving to and from a bed to a chair (including a wheelchair)?: A Little Help needed standing up from a chair using your arms (e.g., wheelchair or bedside chair)?: A Little Help needed to walk in hospital room?: A Little Help needed climbing 3-5 steps with a railing? : A Little 6 Click Score: 18    End of Session Equipment Utilized During Treatment: Gait belt Activity Tolerance: Patient tolerated treatment well Patient left: in chair;with call bell/phone within reach;with chair alarm set   PT Visit Diagnosis: Muscle weakness (generalized) (M62.81);Difficulty  in walking, not elsewhere classified (R26.2);Pain Pain - Right/Left: Right Pain - part of body: Knee     Time: [id number] PT Time Calculation (min) (ACUTE ONLY): 45 min  Charges:  $Gait Training: 8-22 mins $Therapeutic Exercise: 8-22 mins $Therapeutic Activity: 8-22 mins                         Doreatha Massed, PT Acute Rehabilitation

## 2019-05-19 NOTE — TOC Transition Note (Signed)
Transition of Care [facility]) - CM/SW Discharge Note   Patient Details  Name: Audrey Weeks MRN: [id number] Date of Birth: 14-Jun-1960  Transition of Care (TOC) CM/SW Contact:  Lennart Pall, LCSW Phone Number: 05/19/2019, 10:20 AM   Clinical Narrative:   Met briefly with pt and confirming she has received DME via [place] [facility] [place] already arranged at [facility].  No further needs.    Final next level of care: OP Rehab Barriers to Discharge: No Barriers Identified   Patient Goals and CMS Choice Patient states their goals for this hospitalization and ongoing recovery are:: go home      Discharge Placement                       Discharge Plan and Services                DME Arranged: 3-N-1, Walker rolling DME Agency: [employer](arranged PTA by MD office)       HH Arranged: NA(OPPT arranged at [facility] already) [facility] Agency: NA        Social Determinants of Health (SDOH) Interventions     Readmission Risk Interventions No flowsheet data found.

## 2019-05-19 NOTE — Progress Notes (Addendum)
     Subjective: 1 Day Post-Op Procedure(s) (LRB): TOTAL KNEE ARTHROPLASTY (Right)   Patient reports pain as mild, pain controlled with medication.  No reported events throughout the night.  We discussed the procedure, findings and expectations moving forward.  Patient feels that she is doing quite well and feels ready to be discharged home.  Patient will follow-up in the clinic in 2 weeks.  Patient is to call with any questions or concerns.   Patient's anticipated LOS is less than 2 midnights, meeting these requirements: - Younger than 25 - Lives within 1 hour of care - Has a competent adult at home to recover with post-op recover - NO history of  - Diabetes  - Coronary Artery Disease  - Heart failure  - Heart attack  - Stroke  - DVT/VTE  - Cardiac arrhythmia  - Respiratory Failure/COPD  - Renal failure  - Anemia  - Advanced Liver disease       Objective:   VITALS:   Vitals:   05/19/19 0154 05/19/19 0547  BP: 131/82 132/82  Pulse: 91 87  Resp: 14 15  Temp: 98.8 F (37.1 C) 98.1 F (36.7 C)  SpO2: 100% 100%    Dorsiflexion/Plantar flexion intact Incision: dressing C/D/I No cellulitis present Compartment soft  LABS Recent Labs    05/19/19 0320  HGB 11.8*  HCT 34.9*  WBC 14.3*  PLT 303    Recent Labs    05/19/19 0320  NA 136  K 4.2  BUN 12  CREATININE 0.88  GLUCOSE 200*     Assessment/Plan: 1 Day Post-Op Procedure(s) (LRB): TOTAL KNEE ARTHROPLASTY (Right)   Advance diet Up with therapy D/C IV fluids Discharge home Follow up in 2 weeks at [facility] Follow up with OLIN,Mykah Bellomo D in 2 weeks.  Contact information:  [facility] [address], [address] [phone]    Obese (BMI 30-39.9) Estimated body mass index is 38.88 kg/m as calculated from the following:   Height as of this encounter: 5' 2.5" (1.588 m).   Weight as of this encounter: 98 kg. Patient also counseled that weight may inhibit  the healing process Patient counseled that losing weight will help with future health issues        Danae Orleans PA-C  [facility]  [place] [address], [place], [place] [address] Phone: [phone] [email or web address] Facebook  [employer]

## 2019-05-19 NOTE — Progress Notes (Signed)
Physical Therapy Treatment Patient Details Name: Audrey Weeks MRN: [id number] DOB: 1960-04-09 Today's Date: 05/19/2019    History of Present Illness Patient is 59 y.o. female s/p Rt TKA on 05/18/19 with PMH significant for migraines, asthma, OA, anxiety.    PT Comments    2nd session to practice gait training and stair negotiation. Issued HEP for pt to perform 2x day until OP PT. All education completed. Okay to d/c from PT standpoint.    Follow Up Recommendations  Follow surgeon's recommendation for DC plan and follow-up therapies     Equipment Recommendations  Rolling walker with 5" wheels;3in1 (PT)    Recommendations for Other Services       Precautions / Restrictions Precautions Precautions: Fall Restrictions Weight Bearing Restrictions: No Other Position/Activity Restrictions: WBAT    Mobility  Bed Mobility               General bed mobility comments: Assist for R LE  Transfers Overall transfer level: Needs assistance Equipment used: Rolling walker (2 wheeled) Transfers: Sit to/from Stand Sit to Stand: Min guard            Ambulation/Gait Ambulation/Gait assistance: Min guard Gait Distance (Feet): 50 Feet Assistive device: Rolling walker (2 wheeled) Gait Pattern/deviations: Step-to pattern;Step-through pattern;Decreased stride length     General Gait Details: Slow gait speed. Distance limited by pain. VCs safety, sequence.   Stairs Stairs: Yes Stairs assistance: Min assist Stair Management: Step to pattern;Forwards;One rail Left Number of Stairs: 2 General stair comments: VCs safety, technique, sequence. 1 rail, 1 HHA provided. Husband present to observe   Wheelchair Mobility    Modified Rankin (Stroke Patients Only)       Balance Overall balance assessment: Needs assistance         Standing balance support: Bilateral upper extremity supported Standing balance-Leahy Scale: Poor                               Cognition   Behavior During Therapy: WFL for tasks assessed/performed Overall Cognitive Status: Within Functional Limits for tasks assessed                                        Exercises      General Comments        Pertinent Vitals/Pain Pain Assessment: 0-10 Pain Score: 7  Pain Location: Rt knee Pain Descriptors / Indicators: Grimacing;Guarding;Moaning Pain Intervention(s): Monitored during session;Repositioned    Home Living                      Prior Function            PT Goals (current goals can now be found in the care plan section) Progress towards PT goals: Progressing toward goals    Frequency    7X/week      PT Plan Current plan remains appropriate    Co-evaluation              AM-PAC PT "6 Clicks" Mobility   Outcome Measure  Help needed turning from your back to your side while in a flat bed without using bedrails?: A Little Help needed moving from lying on your back to sitting on the side of a flat bed without using bedrails?: A Little Help needed moving to and from a bed to a chair (including  a wheelchair)?: A Little Help needed standing up from a chair using your arms (e.g., wheelchair or bedside chair)?: A Little Help needed to walk in hospital room?: A Little Help needed climbing 3-5 steps with a railing? : A Little 6 Click Score: 18    End of Session Equipment Utilized During Treatment: Gait belt Activity Tolerance: Patient tolerated treatment well Patient left: in chair;with call bell/phone within reach;with chair alarm set;with family/visitor present   PT Visit Diagnosis: Difficulty in walking, not elsewhere classified (R26.2);Pain Pain - Right/Left: Right Pain - part of body: Knee     Time: [id number] PT Time Calculation (min) (ACUTE ONLY): 24 min  Charges:  $Gait Training: 23-37 mins                         Doreatha Massed, PT Acute Rehabilitation

## 2019-05-19 NOTE — Plan of Care (Signed)
Patient discharged home in stable condition 

## 2019-05-21 ENCOUNTER — Other Ambulatory Visit: Payer: Self-pay

## 2019-05-21 ENCOUNTER — Ambulatory Visit (HOSPITAL_COMMUNITY): Payer: Medicare Other | Attending: Orthopedic Surgery | Admitting: Physical Therapy

## 2019-05-21 DIAGNOSIS — M25661 Stiffness of right knee, not elsewhere classified: Secondary | ICD-10-CM | POA: Diagnosis not present

## 2019-05-21 DIAGNOSIS — M6281 Muscle weakness (generalized): Secondary | ICD-10-CM | POA: Insufficient documentation

## 2019-05-21 DIAGNOSIS — R2689 Other abnormalities of gait and mobility: Secondary | ICD-10-CM | POA: Insufficient documentation

## 2019-05-21 NOTE — Therapy (Signed)
[place], [place], [address] Phone: [phone]   Fax:  [phone]  Physical Therapy Evaluation  Patient Details  Name: Audrey Weeks MRN: [id number] Date of Birth: January 05, 1961 Referring Provider (PT): Danae Orleans PA-C   Encounter Date: 05/21/2019  PT End of Session - 05/21/19 1442    Visit Number  1    Number of Visits  18    Date for PT Re-Evaluation  07/02/19    Authorization Type  UHC Medicare; Medicaid secondary    Progress Note Due on Visit  10    PT Start Time  1355   arrived late   PT Stop Time  1430    PT Time Calculation (min)  35 min    Activity Tolerance  Patient limited by pain    Behavior During Therapy  [facility] for tasks assessed/performed       Past Medical History:  Diagnosis Date  . Anxiety   . Arthritis   . Asthma   . Migraines   . Pneumonia     Past Surgical History:  Procedure Laterality Date  . CESAREAN SECTION    . TOTAL KNEE ARTHROPLASTY Right 05/18/2019   Procedure: TOTAL KNEE ARTHROPLASTY;  Surgeon: Paralee Cancel, MD;  Location: [facility];  Service: Orthopedics;  Laterality: Right;  35mins    There were no vitals filed for this visit.   Subjective Assessment - 05/21/19 1403    Subjective  Patient presents to physical therapy with complaint of RT knee pain s/p RTA on 05/18/19. Patient says she is still in a lot of pain, but has "been managing". Says she is getting around alright with rolling walker, is icing PRN and taking pain medication as prescribed. Is currently limited with ambulation, transfers, and having difficulty sleeping at night due to pain. Says she has been doing exercise from hospital. Patient denies sx of fevers chills. Denies numbness or tingling in LE.    Patient is accompained by:  Family member   husband   Limitations  Sitting;Lifting;Standing;Walking;House hold activities    How long can you stand comfortably?  constant pain    How long can you walk  comfortably?  constant pain    Patient Stated Goals  Get this leg to 100% well    Currently in Pain?  Yes    Pain Score  8     Pain Location  Knee    Pain Orientation  Right    Pain Descriptors / Indicators  Aching;Sharp    Pain Type  Surgical pain    Pain Onset  In the past 7 days    Pain Frequency  Constant    Aggravating Factors   standing, bending, walking    Pain Relieving Factors  rest, meds    Effect of Pain on Daily Activities  Limiting         [facility] PT Assessment - 05/21/19 0001      Assessment   Medical Diagnosis  RT TKA     Referring Provider (PT)  Danae Orleans PA-C    Onset Date/Surgical Date  --   05/18/19   Next MD Visit  06/03/19    Prior Therapy  yes, RT knee prior to surgery       Precautions   Precautions  Fall      Restrictions   Weight Bearing Restrictions  No      Balance Screen   Has the patient fallen in the past 6 months  Yes    How many times?  2    Has the patient had a decrease in activity level because of a fear of falling?   Yes    Is the patient reluctant to leave their home because of a fear of falling?   No      Home Film/video editor residence    Living Arrangements  Spouse/significant other    Available Help at Discharge  Family    Type of [place] to enter    Entrance Stairs-Number of Steps  3    Entrance Stairs-Rails  Left    [place]  One level      Prior Function   Level of Independence  Independent      Cognition   Overall Cognitive Status  Within Functional Limits for tasks assessed      Observation/Other Assessments   Observations  Post op bandage intact, minimal drainage, erythema noted at lateral border    Focus on Therapeutic Outcomes (FOTO)   complete at next visit       Observation/Other Assessments-Edema    Edema  --   Mod edema diffuse about RT knee joint      Sensation   Light Touch  Appears Intact      ROM / Strength   AROM / PROM / Strength   AROM;Strength      AROM   AROM Assessment Site  Knee    Right/Left Knee  Right;Left    Right Knee Extension  10    Right Knee Flexion  65    Left Knee Extension  0    Left Knee Flexion  115      Strength   Overall Strength Comments  MMT limited per pain tolerance     Strength Assessment Site  Hip;Knee;Ankle    Right/Left Hip  Right;Left    Right Hip Flexion  3-/5    Left Hip Flexion  4/5    Right/Left Knee  Right;Left    Right Knee Flexion  3+/5    Right Knee Extension  3-/5    Left Knee Flexion  4+/5    Left Knee Extension  4+/5      Palpation   Palpation comment  Mod tenderness to palpation diffuse about RT knee joint       Ambulation/Gait   Ambulation/Gait  Yes    Ambulation/Gait Assistance  6: Modified independent (Device/Increase time)    Assistive device  Rolling walker    Gait Pattern  Decreased step length - right;Decreased step length - left;Decreased stance time - right;Decreased stride length;Decreased hip/knee flexion - right;Decreased weight shift to right;Antalgic;Trunk flexed    Ambulation Surface  Level;Indoor                  Objective measurements completed on examination: See above findings.      [place] [facility] PT Treatment/Exercise - 05/21/19 0001      Exercises   Exercises  Knee/Hip      Knee/Hip Exercises: Supine   Quad Sets  Right;5 reps    Heel Slides  Right;5 reps    Other Supine Knee/Hip Exercises  supine ankle pumps x5             PT Education - 05/21/19 1406    Education Details  on evaluation findings, POC and HEP    Person(s) Educated  Patient    Methods  Explanation  Comprehension  Verbalized understanding       PT Short Term Goals - 05/21/19 1448      PT SHORT TERM GOAL #1   Title  Patient will be independent with initial HEP and self-management strategies to improve functional outcomes    Time  3    Period  Weeks    Status  New    Target Date  06/11/19      PT SHORT TERM GOAL #2   Title  Patient will  have Rt knee AROM 5-100 degrees to improve functional mobility and facilitate squatting to pick up items from floor.    Time  3    Period  Weeks    Status  New    Target Date  06/11/19        PT Long Term Goals - 05/21/19 1449      PT LONG TERM GOAL #1   Title  Patient will have RT knee AROM 0-120 degrees to improve functional mobility and facilitate squatting to pick up items from floor.    Time  6    Period  Weeks    Status  New    Target Date  07/02/19      PT LONG TERM GOAL #2   Title  Patient will report at least 85% overall improvement in subjective complaint to indicate improvement in ability to perform ADLs.    Time  6    Period  Weeks    Status  New    Target Date  07/02/19      PT LONG TERM GOAL #3   Title  Patient will have equal to or > 4+/5 MMT throughout BLEs to improve ability to perform functional mobility, stair ambulation and ADLs.    Time  6    Period  Weeks    Status  New    Target Date  07/02/19      PT LONG TERM GOAL #4   Title  Patient will improve FOTO score by 10% to indicate improvement in functional outcomes    Time  6    Period  Weeks    Status  New    Target Date  07/02/19             Plan - 05/21/19 1444    Clinical Impression Statement  Patient is a 59 y.o. female who presents to physical therapy with complaint of RT knee pain s/p RT TKA on 05/18/19. Patient demonstrates decreased strength, ROM restriction, balance deficits and gait abnormalities which are likely contributing to symptoms of pain and are negatively impacting patient ability to perform ADLs and functional mobility tasks. Patient will benefit from skilled physical therapy services to address these deficits to reduce pain, improve level of function with ADLs, functional mobility tasks, and reduce risk for falls.    Examination-Activity Limitations  Bathing;Sit;Bed Mobility;Sleep;Bend;Squat;Caring for  Others;Stairs;Carry;Stand;Toileting;Dressing;Transfers;Hygiene/Grooming;Lift;Locomotion Level    Examination-Participation Restrictions  Laundry;Yard Work;Driving;Community Activity;Cleaning    Stability/Clinical Decision Making  Stable/Uncomplicated    Clinical Decision Making  Low    Rehab Potential  Good    PT Frequency  3x / week    PT Duration  6 weeks    PT Treatment/Interventions  ADLs/Self Care Home Management;Ultrasound;Neuromuscular re-education;Compression bandaging;Joint Manipulations;Passive range of motion;Scar mobilization;Parrafin;Fluidtherapy;Aquatic Therapy;Contrast Bath;Biofeedback;Patient/family education;DME Instruction;Gait training;Orthotic Fit/Training;Dry needling;Energy conservation;Splinting;Stair training;Cryotherapy;Electrical Stimulation;Functional mobility training;Iontophoresis 4mg /ml Dexamethasone;Therapeutic activities;Therapeutic exercise;Moist Heat;Traction;Balance training;Manual lymph drainage;Manual techniques;Vasopneumatic Device;Taping;Wheelchair mobility training    PT Next Visit Plan  Complete FOTO. Review goals and HEP. Prgress hip  and knee strength, and knee ROM as tolerated. Progress to standing exercise and gait training when able. Manuals PRN for pain and swelling.    PT Home Exercise Plan  05/21/19: quad set, heel slide, ankle pump    Consulted and Agree with Plan of Care  Patient       Patient will benefit from skilled therapeutic intervention in order to improve the following deficits and impairments:  Abnormal gait, Decreased endurance, Hypomobility, Decreased scar mobility, Increased edema, Decreased activity tolerance, Decreased strength, Pain, Decreased balance, Decreased mobility, Difficulty walking, Decreased range of motion, Improper body mechanics, Impaired flexibility  Visit Diagnosis: Stiffness of right knee, not elsewhere classified  Muscle weakness (generalized)  Other abnormalities of gait and mobility     Problem List Patient  Active Problem List   Diagnosis Date Noted  . S/P right TKA 05/18/2019  . OBESITY 05/10/2009  . ANXIETY 05/10/2009  . MIGRAINE HEADACHE 04/15/2009  . PNEUMONIA, COMMUNITY ACQUIRED, PNEUMOCOCCAL 04/15/2009  . ASTHMA 04/15/2009   2:52 PM, 05/21/19 Josue Hector PT DPT  Physical Therapist with [facility]  [phone] [place] [address] [place], [place], [address] Phone: [phone]   Fax:  [phone]  Name: PEACHIE HEGLAND MRN: [id number] Date of Birth: 01-18-1960

## 2019-05-24 ENCOUNTER — Ambulatory Visit (HOSPITAL_COMMUNITY): Payer: Medicare Other | Admitting: Physical Therapy

## 2019-05-24 ENCOUNTER — Encounter (HOSPITAL_COMMUNITY): Payer: Self-pay | Admitting: Physical Therapy

## 2019-05-24 ENCOUNTER — Other Ambulatory Visit: Payer: Self-pay

## 2019-05-24 DIAGNOSIS — M6281 Muscle weakness (generalized): Secondary | ICD-10-CM

## 2019-05-24 DIAGNOSIS — R2689 Other abnormalities of gait and mobility: Secondary | ICD-10-CM

## 2019-05-24 DIAGNOSIS — M25661 Stiffness of right knee, not elsewhere classified: Secondary | ICD-10-CM | POA: Diagnosis not present

## 2019-05-24 NOTE — Therapy (Signed)
[facility] [place], [place], [address] Phone: [phone]   Fax:  [phone]  Physical Therapy Treatment  Patient Details  Name: Audrey Weeks MRN: [id number] Date of Birth: 10-24-1960 Referring Provider (PT): Danae Orleans PA-C   Encounter Date: 05/24/2019  PT End of Session - 05/24/19 0916    Visit Number  2    Number of Visits  18    Date for PT Re-Evaluation  07/02/19    Authorization Type  UHC Medicare; Medicaid secondary    Progress Note Due on Visit  10    PT Start Time  0916   arrived late   PT Stop Time  0945    PT Time Calculation (min)  29 min    Activity Tolerance  Patient limited by pain    Behavior During Therapy  [facility] for tasks assessed/performed       Past Medical History:  Diagnosis Date  . Anxiety   . Arthritis   . Asthma   . Migraines   . Pneumonia     Past Surgical History:  Procedure Laterality Date  . CESAREAN SECTION    . TOTAL KNEE ARTHROPLASTY Right 05/18/2019   Procedure: TOTAL KNEE ARTHROPLASTY;  Surgeon: Paralee Cancel, MD;  Location: [facility];  Service: Orthopedics;  Laterality: Right;  30mins    There were no vitals filed for this visit.  Subjective Assessment - 05/24/19 0917    Subjective  Pateitn says she is sore today. Reports compliance with HEP. Says she was able to shower by herself last night. Says she took some pain medicine this morning before therapy.    Patient is accompained by:  Family member   husband   Limitations  Sitting;Lifting;Standing;Walking;House hold activities    How long can you stand comfortably?  constant pain    How long can you walk comfortably?  constant pain    Patient Stated Goals  Get this leg to 100% well    Currently in Pain?  Yes    Pain Score  9     Pain Location  Knee    Pain Orientation  Right    Pain Descriptors / Indicators  Aching;Throbbing    Pain Type  Surgical pain    Pain Onset  In the past 7 days         [facility] PT  Assessment - 05/24/19 0001      Observation/Other Assessments   Focus on Therapeutic Outcomes (FOTO)   38% limited                     [facility] Adult PT Treatment/Exercise - 05/24/19 0001      Knee/Hip Exercises: Supine   Quad Sets  Right;15 reps    Quad Sets Limitations  5 sec hold     Heel Slides  Right;10 reps    Heel Slides Limitations  10 sec hold     Straight Leg Raises  Right;15 reps    Knee Extension  AROM;Right    Knee Extension Limitations  10    Knee Flexion  AROM;Right    Knee Flexion Limitations  99    Other Supine Knee/Hip Exercises  supine ankle pumps x15    Other Supine Knee/Hip Exercises  glute set 15 x 5" hold                PT Short Term Goals - 05/21/19 1448      PT SHORT TERM GOAL #1  Title  Patient will be independent with initial HEP and self-management strategies to improve functional outcomes    Time  3    Period  Weeks    Status  New    Target Date  06/11/19      PT SHORT TERM GOAL #2   Title  Patient will have Rt knee AROM 5-100 degrees to improve functional mobility and facilitate squatting to pick up items from floor.    Time  3    Period  Weeks    Status  New    Target Date  06/11/19        PT Long Term Goals - 05/21/19 1449      PT LONG TERM GOAL #1   Title  Patient will have RT knee AROM 0-120 degrees to improve functional mobility and facilitate squatting to pick up items from floor.    Time  6    Period  Weeks    Status  New    Target Date  07/02/19      PT LONG TERM GOAL #2   Title  Patient will report at least 85% overall improvement in subjective complaint to indicate improvement in ability to perform ADLs.    Time  6    Period  Weeks    Status  New    Target Date  07/02/19      PT LONG TERM GOAL #3   Title  Patient will have equal to or > 4+/5 MMT throughout BLEs to improve ability to perform functional mobility, stair ambulation and ADLs.    Time  6    Period  Weeks    Status  New    Target Date   07/02/19      PT LONG TERM GOAL #4   Title  Patient will improve FOTO score by 10% to indicate improvement in functional outcomes    Time  6    Period  Weeks    Status  New    Target Date  07/02/19            Plan - 05/24/19 1213    Clinical Impression Statement  Patient with somewhat improved tolerance to activity today. Patient demos improvement in AROM and gait speed using RW post session. Ther ex limited this session per time constraints. Completed FOTO and reviewed patient goals and HEP. Patient remains limited in function by elevated pain level. Patient educated on grading activity per pain tolerance and regular icing to help with pain and swelling. Patient will continue to benefit from skilled therapy services to progress knee AROM and strengthening to reduce pain and improve LOF with ADLs and functional mobility.    Examination-Activity Limitations  Bathing;Sit;Bed Mobility;Sleep;Bend;Squat;Caring for Others;Stairs;Carry;Stand;Toileting;Dressing;Transfers;Hygiene/Grooming;Lift;Locomotion Level    Examination-Participation Restrictions  Laundry;Yard Work;Driving;Community Activity;Cleaning    Stability/Clinical Decision Making  Stable/Uncomplicated    Rehab Potential  Good    PT Frequency  3x / week    PT Duration  6 weeks    PT Treatment/Interventions  ADLs/Self Care Home Management;Ultrasound;Neuromuscular re-education;Compression bandaging;Joint Manipulations;Passive range of motion;Scar mobilization;Parrafin;Fluidtherapy;Aquatic Therapy;Contrast Bath;Biofeedback;Patient/family education;DME Instruction;Gait training;Orthotic Fit/Training;Dry needling;Energy conservation;Splinting;Stair training;Cryotherapy;Electrical Stimulation;Functional mobility training;Iontophoresis 4mg /ml Dexamethasone;Therapeutic activities;Therapeutic exercise;Moist Heat;Traction;Balance training;Manual lymph drainage;Manual techniques;Vasopneumatic Device;Taping;Wheelchair mobility training    PT Next  Visit Plan  Progress hip and knee strength, and knee ROM as tolerated. Progress to standing exercise and gait training when able. Manuals PRN for pain and swelling.    PT Home Exercise Plan  05/21/19: quad set, heel slide, ankle pump; 05/24/19:  SLR, glute set    Consulted and Agree with Plan of Care  Patient       Patient will benefit from skilled therapeutic intervention in order to improve the following deficits and impairments:  Abnormal gait, Decreased endurance, Hypomobility, Decreased scar mobility, Increased edema, Decreased activity tolerance, Decreased strength, Pain, Decreased balance, Decreased mobility, Difficulty walking, Decreased range of motion, Improper body mechanics, Impaired flexibility  Visit Diagnosis: Stiffness of right knee, not elsewhere classified  Muscle weakness (generalized)  Other abnormalities of gait and mobility     Problem List Patient Active Problem List   Diagnosis Date Noted  . S/P right TKA 05/18/2019  . OBESITY 05/10/2009  . ANXIETY 05/10/2009  . MIGRAINE HEADACHE 04/15/2009  . PNEUMONIA, COMMUNITY ACQUIRED, PNEUMOCOCCAL 04/15/2009  . ASTHMA 04/15/2009    12:19 PM, 05/24/19 Josue Hector PT DPT  Physical Therapist with [facility]  [phone] [place] [address] [place], [place], [address] Phone: [phone]   Fax:  [phone]  Name: Audrey Weeks MRN: [id number] Date of Birth: [birth year]

## 2019-05-25 ENCOUNTER — Telehealth (HOSPITAL_COMMUNITY): Payer: Self-pay | Admitting: Physical Therapy

## 2019-05-25 ENCOUNTER — Ambulatory Visit (HOSPITAL_COMMUNITY): Payer: Medicare Other

## 2019-05-25 NOTE — Discharge Summary (Signed)
Physician Discharge Summary  Patient ID: FABLE MURE MRN: [id number] DOB/AGE: May 07, 1960 59 y.o.  Admit date: 05/18/2019 Discharge date: 05/19/2019   Procedures:  Procedure(s) (LRB): TOTAL KNEE ARTHROPLASTY (Right)  Attending Physician:  Dr. Paralee Cancel   Admission Diagnoses:   Right knee primary OA / pain  Discharge Diagnoses:  Principal Problem:   S/P right TKA Active Problems:   OBESITY  Past Medical History:  Diagnosis Date  . Anxiety   . Arthritis   . Asthma   . Migraines   . Pneumonia     HPI:    Audrey Weeks, 59 y.o. female, has a history of pain and functional disability in the right knee due to arthritis and has failed non-surgical conservative treatments for greater than 12 weeks to include NSAID's and/or analgesics, corticosteriod injections, viscosupplementation injections and activity modification.  Onset of symptoms was gradual, starting 7 years ago with gradually worsening course since that time. The patient noted no past surgery on the right knee(s).  Patient currently rates pain in the right knee(s) at 10 out of 10 with activity. Patient has night pain, worsening of pain with activity and weight bearing, pain that interferes with activities of daily living, pain with passive range of motion, crepitus and joint swelling.  Patient has evidence of periarticular osteophytes and joint space narrowing by imaging studies. There is no active infection.  Risks, benefits and expectations were discussed with the patient.  Risks including but not limited to the risk of anesthesia, blood clots, nerve damage, blood vessel damage, failure of the prosthesis, infection and up to and including death.  Patient understand the risks, benefits and expectations and wishes to proceed with surgery.   PCP: Simona Huh, NP   Discharged Condition: good  Hospital Course:  Patient underwent the above stated procedure on 05/18/2019. Patient tolerated the procedure well and  brought to the recovery room in good condition and subsequently to the floor.  POD #1 BP: 132/82 ; Pulse: 87 ; Temp: 98.1 F (36.7 C) ; Resp: 15 Patient reports pain as mild, pain controlled with medication.  No reported events throughout the night.  We discussed the procedure, findings and expectations moving forward.  Patient feels that she is doing quite well and feels ready to be discharged home.  Patient will follow-up in the clinic in 2 weeks.  Patient is to call with any questions or concerns. Dorsiflexion/plantar flexion intact, incision: dressing C/D/I, no cellulitis present and compartment soft.   LABS  Basename    HGB     11.8  HCT     34.9    Discharge Exam: General appearance: alert, cooperative and no distress Extremities: Homans sign is negative, no sign of DVT, no edema, redness or tenderness in the calves or thighs and no ulcers, gangrene or trophic changes  Disposition: Home with follow up in 2 weeks   Follow-up Information    Paralee Cancel, MD. Schedule an appointment as soon as possible for a visit in 2 weeks.   Specialty: Orthopedic Surgery Contact information: [address] [place] [address] [id number]           Discharge Instructions    Call MD / Call 911   Complete by: As directed    If you experience chest pain or shortness of breath, CALL 911 and be transported to the hospital emergency room.  If you develope a fever above 101 F, pus (white drainage) or increased drainage or redness at the wound,  or calf pain, call your surgeon's office.   Change dressing   Complete by: As directed    Maintain surgical dressing until follow up in the clinic. If the edges start to pull up, may reinforce with tape. If the dressing is no longer working, may remove and cover with gauze and tape, but must keep the area dry and clean.  Call with any questions or concerns.   Constipation Prevention   Complete by: As directed    Drink plenty of fluids.   Prune juice may be helpful.  You may use a stool softener, such as Colace (over the counter) 100 mg twice a day.  Use MiraLax (over the counter) for constipation as needed.   Diet - low sodium heart healthy   Complete by: As directed    Discharge instructions   Complete by: As directed    Maintain surgical dressing until follow up in the clinic. If the edges start to pull up, may reinforce with tape. If the dressing is no longer working, may remove and cover with gauze and tape, but must keep the area dry and clean.  Follow up in 2 weeks at [facility]. Call with any questions or concerns.   Increase activity slowly as tolerated   Complete by: As directed    Weight bearing as tolerated with assist device (walker, cane, etc) as directed, use it as long as suggested by your surgeon or therapist, typically at least 4-6 weeks.   TED hose   Complete by: As directed    Use stockings (TED hose) for 2 weeks on both leg(s).  You may remove them at night for sleeping.      Allergies as of 05/19/2019   No Known Allergies     Medication List    STOP taking these medications   BELSOMRA PO   ibuprofen 800 MG tablet Commonly known as: ADVIL   oxyCODONE-acetaminophen 10-325 MG tablet Commonly known as: PERCOCET     TAKE these medications   acetaminophen 500 MG tablet Commonly known as: TYLENOL Take 2 tablets (1,000 mg total) by mouth every 8 (eight) hours.   ALPRAZolam 0.25 MG tablet Commonly known as: XANAX Take 0.25 mg by mouth 2 (two) times daily as needed for anxiety.   aspirin 81 MG chewable tablet Commonly known as: Aspirin Childrens Chew 1 tablet (81 mg total) by mouth 2 (two) times daily. Take for 4 weeks, then resume regular dose.   celecoxib 200 MG capsule Commonly known as: CeleBREX Take 1 capsule (200 mg total) by mouth 2 (two) times daily.   docusate sodium 100 MG capsule Commonly known as: Colace Take 1 capsule (100 mg total) by mouth 2 (two) times daily.   ferrous  sulfate 325 (65 FE) MG tablet Commonly known as: FerrouSul Take 1 tablet (325 mg total) by mouth 3 (three) times daily with meals for 14 days.   gabapentin 600 MG tablet Commonly known as: NEURONTIN Take 600 mg by mouth daily.   hydrOXYzine 25 MG tablet Commonly known as: ATARAX/VISTARIL Take 1 tablet by mouth 3 (three) times daily.   methocarbamol 500 MG tablet Commonly known as: Robaxin Take 1 tablet (500 mg total) by mouth every 6 (six) hours as needed for muscle spasms.   OVER THE COUNTER MEDICATION Take 750 mg by mouth daily. [place]   oxyCODONE 5 MG immediate release tablet Commonly known as: Oxy IR/ROXICODONE Take 1-2 tablets (5-10 mg total) by mouth every 4 (four) hours as needed for moderate pain or  severe pain.   Ozempic (1 MG/DOSE) 2 MG/1.5ML Sopn Generic drug: Semaglutide (1 MG/DOSE) Inject 1 mg into the skin every Friday.   phentermine 37.5 MG capsule Take 37.5 mg by mouth every morning.   polyethylene glycol 17 g packet Commonly known as: MIRALAX / GLYCOLAX Take 17 g by mouth 2 (two) times daily.   topiramate 50 MG tablet Commonly known as: TOPAMAX Take 50 mg by mouth at bedtime as needed (headaches).            Discharge Care Instructions  (From admission, onward)         Start     Ordered   05/19/19 0000  Change dressing    Comments: Maintain surgical dressing until follow up in the clinic. If the edges start to pull up, may reinforce with tape. If the dressing is no longer working, may remove and cover with gauze and tape, but must keep the area dry and clean.  Call with any questions or concerns.   05/19/19 [id number]           Signed: [place] Pugh. Marquesa Rath   PA-C  05/25/2019, 8:14 AM

## 2019-05-25 NOTE — Telephone Encounter (Signed)
pt called to cx today's appt due to does not feel well.

## 2019-05-28 ENCOUNTER — Other Ambulatory Visit: Payer: Self-pay

## 2019-05-28 ENCOUNTER — Encounter (HOSPITAL_COMMUNITY): Payer: Self-pay | Admitting: Physical Therapy

## 2019-05-28 ENCOUNTER — Ambulatory Visit (HOSPITAL_COMMUNITY): Payer: Medicare Other | Admitting: Physical Therapy

## 2019-05-28 DIAGNOSIS — M6281 Muscle weakness (generalized): Secondary | ICD-10-CM

## 2019-05-28 DIAGNOSIS — M25661 Stiffness of right knee, not elsewhere classified: Secondary | ICD-10-CM | POA: Diagnosis not present

## 2019-05-28 DIAGNOSIS — R2689 Other abnormalities of gait and mobility: Secondary | ICD-10-CM

## 2019-05-28 NOTE — Therapy (Signed)
[place], [place], [address] Phone: [phone]   Fax:  [phone]  Physical Therapy Treatment  Patient Details  Name: Audrey Weeks MRN: [id number] Date of Birth: 03/19/1960 Referring Provider (PT): Danae Orleans PA-C   Encounter Date: 05/28/2019  PT End of Session - 05/28/19 0955    Visit Number  3    Number of Visits  18    Date for PT Re-Evaluation  07/02/19    Authorization Type  UHC Medicare; Medicaid secondary    Progress Note Due on Visit  10    PT Start Time  0950    PT Stop Time  1030    PT Time Calculation (min)  40 min    Activity Tolerance  Patient limited by pain    Behavior During Therapy  [facility] for tasks assessed/performed       Past Medical History:  Diagnosis Date  . Anxiety   . Arthritis   . Asthma   . Migraines   . Pneumonia     Past Surgical History:  Procedure Laterality Date  . CESAREAN SECTION    . TOTAL KNEE ARTHROPLASTY Right 05/18/2019   Procedure: TOTAL KNEE ARTHROPLASTY;  Surgeon: Paralee Cancel, MD;  Location: [facility];  Service: Orthopedics;  Laterality: Right;  57mins    There were no vitals filed for this visit.  Subjective Assessment - 05/28/19 0953    Subjective  Patient says she is in pain " I feel like I'm having a baby, but I'm here". Says she came off her meds but she is managing. Notes her anxiety has been elevated lately.    Patient is accompained by:  Family member   husband   Limitations  Sitting;Lifting;Standing;Walking;House hold activities    How long can you stand comfortably?  constant pain    How long can you walk comfortably?  constant pain    Patient Stated Goals  Get this leg to 100% well    Currently in Pain?  Yes    Pain Score  10-Worst pain ever    Pain Location  Knee    Pain Orientation  Right    Pain Descriptors / Indicators  Aching;Throbbing    Pain Type  Surgical pain    Pain Onset  In the past 7 days                         [facility] Adult PT Treatment/Exercise - 05/28/19 0001      Knee/Hip Exercises: Standing   Heel Raises  Both;15 reps      Knee/Hip Exercises: Supine   Quad Sets  Right;10 reps    Quad Sets Limitations  5 sec hold    Heel Slides  Right;10 reps    Bridges  Both;1 set;10 reps    Bridges Limitations  5 sec holds     Straight Leg Raises  Right;10 reps    Knee Extension  AROM;Right    Knee Extension Limitations  5    Knee Flexion  AROM;Right    Knee Flexion Limitations  100    Other Supine Knee/Hip Exercises  heel prop 2 min     Other Supine Knee/Hip Exercises  glute set 10 x 5" hold       Manual Therapy   Manual Therapy  Edema management    Manual therapy comments  Manual treatemtn performed separate from all other activity     Edema Management  Retrograde  message to RLE elevated on wedge for reduced sweeling and improved fluid return              PT Education - 05/28/19 1030    Education Details  on pacing activity level at home, monitoring pain sx and avoiding elevated levels, pain management strategy, f/u with PCP about med regime and difficulty sleeping    Person(s) Educated  Patient    Methods  Explanation    Comprehension  Verbalized understanding       PT Short Term Goals - 05/21/19 1448      PT SHORT TERM GOAL #1   Title  Patient will be independent with initial HEP and self-management strategies to improve functional outcomes    Time  3    Period  Weeks    Status  New    Target Date  06/11/19      PT SHORT TERM GOAL #2   Title  Patient will have Rt knee AROM 5-100 degrees to improve functional mobility and facilitate squatting to pick up items from floor.    Time  3    Period  Weeks    Status  New    Target Date  06/11/19        PT Long Term Goals - 05/21/19 1449      PT LONG TERM GOAL #1   Title  Patient will have RT knee AROM 0-120 degrees to improve functional mobility and facilitate squatting to pick up items from  floor.    Time  6    Period  Weeks    Status  New    Target Date  07/02/19      PT LONG TERM GOAL #2   Title  Patient will report at least 85% overall improvement in subjective complaint to indicate improvement in ability to perform ADLs.    Time  6    Period  Weeks    Status  New    Target Date  07/02/19      PT LONG TERM GOAL #3   Title  Patient will have equal to or > 4+/5 MMT throughout BLEs to improve ability to perform functional mobility, stair ambulation and ADLs.    Time  6    Period  Weeks    Status  New    Target Date  07/02/19      PT LONG TERM GOAL #4   Title  Patient will improve FOTO score by 10% to indicate improvement in functional outcomes    Time  6    Period  Weeks    Status  New    Target Date  07/02/19            Plan - 05/28/19 1206    Clinical Impression Statement  Patient continues to be limited by elevated pain level. Educated patient and her husband on pacing activity at home in attempt to reduce overall pain levels, discussed pain management strategies. Activity graded per patient tolerance today. Patient is progressing well toward AROM goals, but with elevated pain. Patient mentioned she recently changed medication schedule and is having difficulty sleeping at night. Instructed patient to follow up with primary care MD about this issue to adjust meds as needed. Added manual edema message today to address pain and swelling, patient did note some reduction in overall complaint and showed somewhat less antalgia during ambulation post session. Patient will continue to benefit from skilled therapy services to progress knee AROM and strength to reduce pain and  improve LOF with ADLs and functional mobility.    Examination-Activity Limitations  Bathing;Sit;Bed Mobility;Sleep;Bend;Squat;Caring for Others;Stairs;Carry;Stand;Toileting;Dressing;Transfers;Hygiene/Grooming;Lift;Locomotion Level    Examination-Participation Restrictions  Laundry;Yard  Work;Driving;Community Activity;Cleaning    Stability/Clinical Decision Making  Stable/Uncomplicated    Rehab Potential  Good    PT Frequency  3x / week    PT Duration  6 weeks    PT Treatment/Interventions  ADLs/Self Care Home Management;Ultrasound;Neuromuscular re-education;Compression bandaging;Joint Manipulations;Passive range of motion;Scar mobilization;Parrafin;Fluidtherapy;Aquatic Therapy;Contrast Bath;Biofeedback;Patient/family education;DME Instruction;Gait training;Orthotic Fit/Training;Dry needling;Energy conservation;Splinting;Stair training;Cryotherapy;Electrical Stimulation;Functional mobility training;Iontophoresis 4mg /ml Dexamethasone;Therapeutic activities;Therapeutic exercise;Moist Heat;Traction;Balance training;Manual lymph drainage;Manual techniques;Vasopneumatic Device;Taping;Wheelchair mobility training    PT Next Visit Plan  Progress hip and knee strength, and knee ROM as tolerated. Progress to standing exercise and gait training when able. Manuals PRN for pain and swelling.    PT Home Exercise Plan  05/21/19: quad set, heel slide, ankle pump; 05/24/19: SLR, glute set    Consulted and Agree with Plan of Care  Patient       Patient will benefit from skilled therapeutic intervention in order to improve the following deficits and impairments:  Abnormal gait, Decreased endurance, Hypomobility, Decreased scar mobility, Increased edema, Decreased activity tolerance, Decreased strength, Pain, Decreased balance, Decreased mobility, Difficulty walking, Decreased range of motion, Improper body mechanics, Impaired flexibility  Visit Diagnosis: Stiffness of right knee, not elsewhere classified  Muscle weakness (generalized)  Other abnormalities of gait and mobility     Problem List Patient Active Problem List   Diagnosis Date Noted  . S/P right TKA 05/18/2019  . OBESITY 05/10/2009  . ANXIETY 05/10/2009  . MIGRAINE HEADACHE 04/15/2009  . PNEUMONIA, COMMUNITY ACQUIRED,  PNEUMOCOCCAL 04/15/2009  . ASTHMA 04/15/2009   12:14 PM, 05/28/19 Josue Hector PT DPT  Physical Therapist with [facility]  [phone] [place] [address] [place], [place], [address] Phone: [phone]   Fax:  [phone]  Name: MARENA BUCKHANNON MRN: [id number] Date of Birth: [birth year]

## 2019-05-31 ENCOUNTER — Other Ambulatory Visit: Payer: Self-pay

## 2019-05-31 ENCOUNTER — Ambulatory Visit (HOSPITAL_COMMUNITY): Payer: Medicare Other

## 2019-05-31 ENCOUNTER — Encounter (HOSPITAL_COMMUNITY): Payer: Self-pay

## 2019-05-31 DIAGNOSIS — M25661 Stiffness of right knee, not elsewhere classified: Secondary | ICD-10-CM | POA: Diagnosis not present

## 2019-05-31 DIAGNOSIS — R2689 Other abnormalities of gait and mobility: Secondary | ICD-10-CM

## 2019-05-31 DIAGNOSIS — M6281 Muscle weakness (generalized): Secondary | ICD-10-CM

## 2019-05-31 NOTE — Therapy (Signed)
[place], [place], [address] Phone: [phone]   Fax:  [phone]  Physical Therapy Treatment  Patient Details  Name: Audrey Weeks MRN: [id number] Date of Birth: 22-May-1960 Referring Provider (PT): Danae Orleans PA-C   Encounter Date: 05/31/2019  PT End of Session - 05/31/19 1032    Visit Number  4    Number of Visits  18    Date for PT Re-Evaluation  07/02/19    Authorization Type  UHC Medicare; Medicaid secondary    Progress Note Due on Visit  10    PT Start Time  1032    PT Stop Time  1117    PT Time Calculation (min)  45 min    Activity Tolerance  Patient limited by pain    Behavior During Therapy  [facility] for tasks assessed/performed       Past Medical History:  Diagnosis Date  . Anxiety   . Arthritis   . Asthma   . Migraines   . Pneumonia     Past Surgical History:  Procedure Laterality Date  . CESAREAN SECTION    . TOTAL KNEE ARTHROPLASTY Right 05/18/2019   Procedure: TOTAL KNEE ARTHROPLASTY;  Surgeon: Paralee Cancel, MD;  Location: [facility];  Service: Orthopedics;  Laterality: Right;  81mins    There were no vitals filed for this visit.  Subjective Assessment - 05/31/19 1032    Subjective  Back of her knee spasms/cramps. Keeps her up at night. Uses walker at home but didn't bring it today.    Patient is accompained by:  Family member   husband   Limitations  Sitting;Lifting;Standing;Walking;House hold activities    How long can you stand comfortably?  constant pain    How long can you walk comfortably?  constant pain    Patient Stated Goals  Get this leg to 100% well    Currently in Pain?  Yes    Pain Score  8     Pain Location  Knee    Pain Orientation  Right;Posterior;Proximal    Pain Descriptors / Indicators  Sharp    Pain Onset  In the past 7 days                        [facility] Adult PT Treatment/Exercise - 05/31/19 0001      Ambulation/Gait   Ambulation/Gait   Yes    Ambulation/Gait Assistance  6: Modified independent (Device/Increase time)    Ambulation Distance (Feet)  120 Feet   x2   Assistive device  Straight cane;None    Gait Pattern  Step-through pattern;Decreased arm swing - right;Decreased arm swing - left;Decreased step length - right;Decreased step length - left;Decreased stance time - right;Decreased stride length;Decreased weight shift to right;Antalgic    Ambulation Surface  Level      Knee/Hip Exercises: Stretches   Press photographer  30 seconds;3 reps    Gastroc Stretch Limitations  slantboard      Knee/Hip Exercises: Standing   Heel Raises  Both;15 reps      Knee/Hip Exercises: Supine   Quad Sets  Right;10 reps    Quad Sets Limitations  5 sec hold    Heel Slides  Right;10 reps    Heel Slides Limitations  5 sec hold in flex and ext    Bridges  Both;1 set;10 reps    Bridges Limitations  5 sec holds     Straight Leg Raises  Right;10  reps    Straight Leg Raises Limitations  5 sec hold    Knee Extension  AROM;Right    Knee Extension Limitations  5    Knee Flexion  AROM;Right    Knee Flexion Limitations  92    Other Supine Knee/Hip Exercises  glute set 10 x 5" hold       Manual Therapy   Manual Therapy  Soft tissue mobilization    Manual therapy comments  Manual treatemtn performed separate from all other activity     Soft tissue mobilization  proximal med/lat heads of gastroc             PT Education - 05/31/19 1233    Education Details  Discussed purpose and technique of exercises throughout session. Added standing calf stretch to HEP.    Person(s) Educated  Patient    Methods  Explanation;Handout    Comprehension  Verbalized understanding       PT Short Term Goals - 05/31/19 1239      PT SHORT TERM GOAL #1   Title  Patient will be independent with initial HEP and self-management strategies to improve functional outcomes    Time  3    Period  Weeks    Status  New    Target Date  06/11/19      PT SHORT  TERM GOAL #2   Title  Patient will have Rt knee AROM 5-100 degrees to improve functional mobility and facilitate squatting to pick up items from floor.    Time  3    Period  Weeks    Status  New    Target Date  06/11/19        PT Long Term Goals - 05/31/19 1239      PT LONG TERM GOAL #1   Title  Patient will have RT knee AROM 0-120 degrees to improve functional mobility and facilitate squatting to pick up items from floor.    Time  6    Period  Weeks    Status  New      PT LONG TERM GOAL #2   Title  Patient will report at least 85% overall improvement in subjective complaint to indicate improvement in ability to perform ADLs.    Time  6    Period  Weeks    Status  New      PT LONG TERM GOAL #3   Title  Patient will have equal to or > 4+/5 MMT throughout BLEs to improve ability to perform functional mobility, stair ambulation and ADLs.    Time  6    Period  Weeks    Status  New      PT LONG TERM GOAL #4   Title  Patient will improve FOTO score by 10% to indicate improvement in functional outcomes    Time  6    Period  Weeks    Status  New            Plan - 05/31/19 1033    Clinical Impression Statement  Patient continues to be limited by elevated pain level. Patient walked into clinic without walker and quite antalgic gait. Educated patient on gait cycle, reduce speed and equalize step lengths. Patient reported less pain with walking after instruction. Added manual soft tissue mobilizations to proximal medial and lateral heads of the gastrocnemius today to address pain and soft tissue adhesions. Patient did note some reduction in overall complaint with pain decreasing to a 7/10 dull, no longer  sharp. Patient will continue to benefit from skilled therapy services to progress knee AROM and strength to reduce pain and improve LOF with ADLs and functional mobility. Added standing calf stretch to HEP.    Examination-Activity Limitations  Bathing;Sit;Bed  Mobility;Sleep;Bend;Squat;Caring for Others;Stairs;Carry;Stand;Toileting;Dressing;Transfers;Hygiene/Grooming;Lift;Locomotion Level    Examination-Participation Restrictions  Laundry;Yard Work;Driving;Community Activity;Cleaning    Stability/Clinical Decision Making  Stable/Uncomplicated    Rehab Potential  Good    PT Frequency  3x / week    PT Duration  6 weeks    PT Treatment/Interventions  ADLs/Self Care Home Management;Ultrasound;Neuromuscular re-education;Compression bandaging;Joint Manipulations;Passive range of motion;Scar mobilization;Parrafin;Fluidtherapy;Aquatic Therapy;Contrast Bath;Biofeedback;Patient/family education;DME Instruction;Gait training;Orthotic Fit/Training;Dry needling;Energy conservation;Splinting;Stair training;Cryotherapy;Electrical Stimulation;Functional mobility training;Iontophoresis 4mg /ml Dexamethasone;Therapeutic activities;Therapeutic exercise;Moist Heat;Traction;Balance training;Manual lymph drainage;Manual techniques;Vasopneumatic Device;Taping;Wheelchair mobility training    PT Next Visit Plan  Progress hip and knee strength, and knee ROM as tolerated. Progress to standing exercise and gait training when able. Manuals PRN for pain and swelling.    PT Home Exercise Plan  05/21/19: quad set, heel slide, ankle pump; 05/24/19: SLR, glute set; 05/31/19 - standing calf stretch    Consulted and Agree with Plan of Care  Patient       Patient will benefit from skilled therapeutic intervention in order to improve the following deficits and impairments:  Abnormal gait, Decreased endurance, Hypomobility, Decreased scar mobility, Increased edema, Decreased activity tolerance, Decreased strength, Pain, Decreased balance, Decreased mobility, Difficulty walking, Decreased range of motion, Improper body mechanics, Impaired flexibility  Visit Diagnosis: Stiffness of right knee, not elsewhere classified  Muscle weakness (generalized)  Other abnormalities of gait and  mobility     Problem List Patient Active Problem List   Diagnosis Date Noted  . S/P right TKA 05/18/2019  . OBESITY 05/10/2009  . ANXIETY 05/10/2009  . MIGRAINE HEADACHE 04/15/2009  . PNEUMONIA, COMMUNITY ACQUIRED, PNEUMOCOCCAL 04/15/2009  . ASTHMA 04/15/2009    Floria Raveling. Hartnett-Rands, MS, PT Per [place] [id number] 05/31/2019, 12:40 PM  [place] [address] [place], [place], [address] Phone: [phone]   Fax:  [phone]  Name: Audrey Weeks MRN: [id number] Date of Birth: 04/22/1960

## 2019-05-31 NOTE — Patient Instructions (Signed)
Calf Stretch    Place one leg forward, bent, other leg behind and straight. Lean forward keeping back heel flat. Hold _30___ seconds while counting out loud. Repeat with other leg forward. Repeat 3____ times. Do _1___ sessions per day.  http://gt2.exer.us/478   Copyright  VHI. All rights reserved.

## 2019-06-01 ENCOUNTER — Telehealth (HOSPITAL_COMMUNITY): Payer: Self-pay | Admitting: Physical Therapy

## 2019-06-01 NOTE — Telephone Encounter (Signed)
pt cancelled appt for 5/26 because she had to go to the MD

## 2019-06-02 ENCOUNTER — Encounter (HOSPITAL_COMMUNITY): Payer: Medicare Other | Admitting: Physical Therapy

## 2019-06-04 ENCOUNTER — Ambulatory Visit (HOSPITAL_COMMUNITY): Payer: Medicare Other | Admitting: Physical Therapy

## 2019-06-04 ENCOUNTER — Other Ambulatory Visit: Payer: Self-pay

## 2019-06-04 DIAGNOSIS — M25661 Stiffness of right knee, not elsewhere classified: Secondary | ICD-10-CM

## 2019-06-04 DIAGNOSIS — R2689 Other abnormalities of gait and mobility: Secondary | ICD-10-CM

## 2019-06-04 DIAGNOSIS — M6281 Muscle weakness (generalized): Secondary | ICD-10-CM

## 2019-06-04 NOTE — Therapy (Signed)
[place], [place], [address] Phone: [phone]   Fax:  [phone]  Physical Therapy Treatment  Patient Details  Name: Audrey Weeks MRN: [id number] Date of Birth: March 22, 1960 Referring Provider (PT): Danae Orleans PA-C   Encounter Date: 06/04/2019  PT End of Session - 06/04/19 1126    Visit Number  5    Number of Visits  18    Date for PT Re-Evaluation  07/02/19    Authorization Type  [employer]HC Medicare; Medicaid secondary    Progress Note Due on Visit  10    PT Start Time  1010    PT Stop Time  1050    PT Time Calculation (min)  40 min    Activity Tolerance  Patient limited by pain    Behavior During Therapy  [facility] for tasks assessed/performed       Past Medical History:  Diagnosis Date  . Anxiety   . Arthritis   . Asthma   . Migraines   . Pneumonia     Past Surgical History:  Procedure Laterality Date  . CESAREAN SECTION    . TOTAL KNEE ARTHROPLASTY Right 05/18/2019   Procedure: TOTAL KNEE ARTHROPLASTY;  Surgeon: Paralee Cancel, MD;  Location: [facility];  Service: Orthopedics;  Laterality: Right;  62mins    There were no vitals filed for this visit.  Subjective Assessment - 06/04/19 1024    Subjective  Pt was 10 minutes late for appt.  STates her pain is 9/10 however denies need to go to ED.  STates she's been working it at home.    Currently in Pain?  Yes    Pain Score  9     Pain Location  Knee    Pain Orientation  Right    Pain Descriptors / Indicators  Aching;Sharp    Pain Type  Surgical pain                        [facility] Adult PT Treatment/Exercise - 06/04/19 0001      Knee/Hip Exercises: Stretches   Knee: Self-Stretch to increase Flexion  Both;10 seconds    Knee: Self-Stretch Limitations  10 reps on 12" box    Gastroc Stretch  30 seconds;3 reps    Gastroc Stretch Limitations  slantboard      Knee/Hip Exercises: Aerobic   Stationary Bike  seat 13 full revolutions after  several rocks 4 minutes      Knee/Hip Exercises: Standing   Heel Raises  Both;15 reps    Knee Flexion  Right;10 reps      Knee/Hip Exercises: Supine   Quad Sets  Right;10 reps    Quad Sets Limitations  5 sec hold    Heel Slides  Right;10 reps    Heel Slides Limitations  5 sec hold in flex and ext    Bridges  Both;1 set;10 reps    Bridges Limitations  5 sec holds     Knee Extension  AROM;Right    Knee Extension Limitations  8    Knee Flexion  AROM;Right    Knee Flexion Limitations  105      Manual Therapy   Manual Therapy  Soft tissue mobilization;Myofascial release    Manual therapy comments  Manual treatemtn performed separate from all other activity     Edema Management  Retrograde message to RLE elevated on wedge for reduced sweeling and improved fluid return     Myofascial Release  to reduce adhesions and scar tissue               PT Short Term Goals - 05/31/19 1239      PT SHORT TERM GOAL #1   Title  Patient will be independent with initial HEP and self-management strategies to improve functional outcomes    Time  3    Period  Weeks    Status  New    Target Date  06/11/19      PT SHORT TERM GOAL #2   Title  Patient will have Rt knee AROM 5-100 degrees to improve functional mobility and facilitate squatting to pick up items from floor.    Time  3    Period  Weeks    Status  New    Target Date  06/11/19        PT Long Term Goals - 05/31/19 1239      PT LONG TERM GOAL #1   Title  Patient will have RT knee AROM 0-120 degrees to improve functional mobility and facilitate squatting to pick up items from floor.    Time  6    Period  Weeks    Status  New      PT LONG TERM GOAL #2   Title  Patient will report at least 85% overall improvement in subjective complaint to indicate improvement in ability to perform ADLs.    Time  6    Period  Weeks    Status  New      PT LONG TERM GOAL #3   Title  Patient will have equal to or > 4+/5 MMT throughout BLEs to  improve ability to perform functional mobility, stair ambulation and ADLs.    Time  6    Period  Weeks    Status  New      PT LONG TERM GOAL #4   Title  Patient will improve FOTO score by 10% to indicate improvement in functional outcomes    Time  6    Period  Weeks    Status  New            Plan - 06/04/19 1120    Clinical Impression Statement  Began on bike with only a coupld of rocks before able to make full revolutions.  Added knee flexion stretch and hamstring curls in standing.  Pt requires postural cues as tends to complete all activites forward flexed.  Manual completed with noted tightness length of scar line and on either side.  Able to loosen all this with resultand increase of flexion to 105 today, 13 degrees more than last visit.  Pt reported improvement at EOS with less pain and dysfuntion.    Examination-Activity Limitations  Bathing;Sit;Bed Mobility;Sleep;Bend;Squat;Caring for Others;Stairs;Carry;Stand;Toileting;Dressing;Transfers;Hygiene/Grooming;Lift;Locomotion Level    Examination-Participation Restrictions  Laundry;Yard Work;Driving;Community Activity;Cleaning    Stability/Clinical Decision Making  Stable/Uncomplicated    Rehab Potential  Good    PT Frequency  3x / week    PT Duration  6 weeks    PT Treatment/Interventions  ADLs/Self Care Home Management;Ultrasound;Neuromuscular re-education;Compression bandaging;Joint Manipulations;Passive range of motion;Scar mobilization;Parrafin;Fluidtherapy;Aquatic Therapy;Contrast Bath;Biofeedback;Patient/family education;DME Instruction;Gait training;Orthotic Fit/Training;Dry needling;Energy conservation;Splinting;Stair training;Cryotherapy;Electrical Stimulation;Functional mobility training;Iontophoresis 4mg /ml Dexamethasone;Therapeutic activities;Therapeutic exercise;Moist Heat;Traction;Balance training;Manual lymph drainage;Manual techniques;Vasopneumatic Device;Taping;Wheelchair mobility training    PT Next Visit Plan   Progress hip and knee strength, and knee ROM as tolerated. Progress standing exercises and begin gait training with SPC next session.  Continue manual PRN for reducing edema and improving elasticity.    PT  Home Exercise Plan  05/21/19: quad set, heel slide, ankle pump; 05/24/19: SLR, glute set; 05/31/19 - standing calf stretch    Consulted and Agree with Plan of Care  Patient       Patient will benefit from skilled therapeutic intervention in order to improve the following deficits and impairments:  Abnormal gait, Decreased endurance, Hypomobility, Decreased scar mobility, Increased edema, Decreased activity tolerance, Decreased strength, Pain, Decreased balance, Decreased mobility, Difficulty walking, Decreased range of motion, Improper body mechanics, Impaired flexibility  Visit Diagnosis: Stiffness of right knee, not elsewhere classified  Muscle weakness (generalized)  Other abnormalities of gait and mobility     Problem List Patient Active Problem List   Diagnosis Date Noted  . S/P right TKA 05/18/2019  . OBESITY 05/10/2009  . ANXIETY 05/10/2009  . MIGRAINE HEADACHE 04/15/2009  . PNEUMONIA, COMMUNITY ACQUIRED, PNEUMOCOCCAL 04/15/2009  . ASTHMA 04/15/2009   Teena Irani, PTA/CLT [phone]   Teena Irani 06/04/2019, 11:28 AM  [place], [place], [address] Phone: [phone]   Fax:  [phone]  Name: Audrey Weeks MRN: [id number] Date of Birth: Aug 22, 1960

## 2019-06-08 ENCOUNTER — Encounter (HOSPITAL_COMMUNITY): Payer: Self-pay | Admitting: Physical Therapy

## 2019-06-08 ENCOUNTER — Other Ambulatory Visit: Payer: Self-pay

## 2019-06-08 ENCOUNTER — Ambulatory Visit (HOSPITAL_COMMUNITY): Payer: Medicare Other | Attending: Orthopedic Surgery | Admitting: Physical Therapy

## 2019-06-08 DIAGNOSIS — R2689 Other abnormalities of gait and mobility: Secondary | ICD-10-CM | POA: Diagnosis present

## 2019-06-08 DIAGNOSIS — M25661 Stiffness of right knee, not elsewhere classified: Secondary | ICD-10-CM | POA: Insufficient documentation

## 2019-06-08 DIAGNOSIS — M6281 Muscle weakness (generalized): Secondary | ICD-10-CM | POA: Diagnosis present

## 2019-06-08 NOTE — Therapy (Signed)
[place], [place], [address] Phone: [phone]   Fax:  [phone]  Physical Therapy Treatment  Patient Details  Name: Audrey Weeks MRN: [id number] Date of Birth: 1960-01-09 Referring Provider (PT): Danae Orleans PA-C   Encounter Date: 06/08/2019  PT End of Session - 06/08/19 1358    Visit Number  6    Number of Visits  18    Date for PT Re-Evaluation  07/02/19    Authorization Type  UHC Medicare; Medicaid secondary    Progress Note Due on Visit  10    PT Start Time  1349    PT Stop Time  1430    PT Time Calculation (min)  41 min    Activity Tolerance  Patient tolerated treatment well    Behavior During Therapy  [facility] for tasks assessed/performed       Past Medical History:  Diagnosis Date  . Anxiety   . Arthritis   . Asthma   . Migraines   . Pneumonia     Past Surgical History:  Procedure Laterality Date  . CESAREAN SECTION    . TOTAL KNEE ARTHROPLASTY Right 05/18/2019   Procedure: TOTAL KNEE ARTHROPLASTY;  Surgeon: Paralee Cancel, MD;  Location: [facility];  Service: Orthopedics;  Laterality: Right;  79mins    There were no vitals filed for this visit.  Subjective Assessment - 06/08/19 1356    Subjective  Patient says pain is down to an 8. says she has been doing her exercising and walking. Says she is walking better without walker.    Patient is accompained by:  Family member    Limitations  Sitting;Lifting;Standing;Walking;House hold activities    Currently in Pain?  Yes    Pain Score  8     Pain Location  Knee    Pain Orientation  Right    Pain Descriptors / Indicators  Aching    Pain Type  Surgical pain                        [facility] Adult PT Treatment/Exercise - 06/08/19 0001      Knee/Hip Exercises: Stretches   Knee: Self-Stretch to increase Flexion  Right;5 reps;10 seconds    Knee: Self-Stretch Limitations  on 12 inch box     Gastroc Stretch  30 seconds;3 reps     Gastroc Stretch Limitations  slantboard      Knee/Hip Exercises: Aerobic   Stationary Bike  seat 13 full revolutions after several rocks 4 minutes      Knee/Hip Exercises: Standing   Heel Raises  Both;15 reps    Lateral Step Up  Right;1 set;10 reps;Hand Hold: 2;Step Height: 4"    Forward Step Up  Right;1 set;10 reps;Hand Hold: 2;Step Height: 4"    Other Standing Knee Exercises  tandem stance 2 x 30" solid floor       Knee/Hip Exercises: Seated   Sit to Sand  1 set;10 reps;without UE support      Knee/Hip Exercises: Supine   Quad Sets  Right;10 reps    Quad Sets Limitations  5" hold with heel prop on towel     Heel Slides  Right;10 reps    Knee Extension  AROM;Right    Knee Extension Limitations  5    Knee Flexion  AROM;Right    Knee Flexion Limitations  110      Manual Therapy   Manual Therapy  Myofascial release;Edema management  Manual therapy comments  Manual treatment performed separate from all other activity     Edema Management  Retrograde message to RLE elevated for reduced swelling and pain     Myofascial Release  scar tissue message               PT Short Term Goals - 05/31/19 1239      PT SHORT TERM GOAL #1   Title  Patient will be independent with initial HEP and self-management strategies to improve functional outcomes    Time  3    Period  Weeks    Status  New    Target Date  06/11/19      PT SHORT TERM GOAL #2   Title  Patient will have Rt knee AROM 5-100 degrees to improve functional mobility and facilitate squatting to pick up items from floor.    Time  3    Period  Weeks    Status  New    Target Date  06/11/19        PT Long Term Goals - 05/31/19 1239      PT LONG TERM GOAL #1   Title  Patient will have RT knee AROM 0-120 degrees to improve functional mobility and facilitate squatting to pick up items from floor.    Time  6    Period  Weeks    Status  New      PT LONG TERM GOAL #2   Title  Patient will report at least 85% overall  improvement in subjective complaint to indicate improvement in ability to perform ADLs.    Time  6    Period  Weeks    Status  New      PT LONG TERM GOAL #3   Title  Patient will have equal to or > 4+/5 MMT throughout BLEs to improve ability to perform functional mobility, stair ambulation and ADLs.    Time  6    Period  Weeks    Status  New      PT LONG TERM GOAL #4   Title  Patient will improve FOTO score by 10% to indicate improvement in functional outcomes    Time  [place] - 06/08/19 1431    Clinical Impression Statement  Patient is progressing well toward therapy goals. Patient still limited by elevated subjective pain, but with good tolerance overall to activity and ther ex progressions. Patient able to progress standing LE strengthening today with added forward and lateral step ups on 4 inch box. Patient shows improving knee AROM. Patient a little unsteady with box step ups today, requiring intermittent HHA to balance. Added static tandem standing, patient tolerated this well. Patient will continue to benefit from skilled therapy services to progress knee strength and mobility for reduced pain and improved functional mobility.    Examination-Activity Limitations  Bathing;Sit;Bed Mobility;Sleep;Bend;Squat;Caring for Others;Stairs;Carry;Stand;Toileting;Dressing;Transfers;Hygiene/Grooming;Lift;Locomotion Level    Examination-Participation Restrictions  Laundry;Yard Work;Driving;Community Activity;Cleaning    Stability/Clinical Decision Making  Stable/Uncomplicated    Rehab Potential  Good    PT Frequency  3x / week    PT Duration  6 weeks    PT Treatment/Interventions  ADLs/Self Care Home Management;Ultrasound;Neuromuscular re-education;Compression bandaging;Joint Manipulations;Passive range of motion;Scar mobilization;Parrafin;Fluidtherapy;Aquatic Therapy;Contrast Bath;Biofeedback;Patient/family education;DME Instruction;Gait  training;Orthotic Fit/Training;Dry needling;Energy conservation;Splinting;Stair training;Cryotherapy;Electrical Stimulation;Functional mobility training;Iontophoresis 4mg /ml Dexamethasone;Therapeutic activities;Therapeutic exercise;Moist Heat;Traction;Balance training;Manual lymph drainage;Manual techniques;Vasopneumatic Device;Taping;Wheelchair mobility training  PT Next Visit Plan  Progress hip and knee strength, and knee ROM as tolerated. Begin gait traininand sidestepping next visit.    PT Home Exercise Plan  05/21/19: quad set, heel slide, ankle pump; 05/24/19: SLR, glute set; 05/31/19 - standing calf stretch    Consulted and Agree with Plan of Care  Patient       Patient will benefit from skilled therapeutic intervention in order to improve the following deficits and impairments:  Abnormal gait, Decreased endurance, Hypomobility, Decreased scar mobility, Increased edema, Decreased activity tolerance, Decreased strength, Pain, Decreased balance, Decreased mobility, Difficulty walking, Decreased range of motion, Improper body mechanics, Impaired flexibility  Visit Diagnosis: Stiffness of right knee, not elsewhere classified  Muscle weakness (generalized)  Other abnormalities of gait and mobility     Problem List Patient Active Problem List   Diagnosis Date Noted  . S/P right TKA 05/18/2019  . OBESITY 05/10/2009  . ANXIETY 05/10/2009  . MIGRAINE HEADACHE 04/15/2009  . PNEUMONIA, COMMUNITY ACQUIRED, PNEUMOCOCCAL 04/15/2009  . ASTHMA 04/15/2009    4:16 PM, 06/08/19 Josue Hector PT DPT  Physical Therapist with [facility]  [phone] [place] [address] [place], [place], [address] Phone: [phone]   Fax:  [phone]  Name: Audrey Weeks MRN: [id number] Date of Birth: 07-10-1960

## 2019-06-10 ENCOUNTER — Other Ambulatory Visit: Payer: Self-pay

## 2019-06-10 ENCOUNTER — Ambulatory Visit (HOSPITAL_COMMUNITY): Payer: Medicare Other | Admitting: Physical Therapy

## 2019-06-10 DIAGNOSIS — R2689 Other abnormalities of gait and mobility: Secondary | ICD-10-CM

## 2019-06-10 DIAGNOSIS — M25661 Stiffness of right knee, not elsewhere classified: Secondary | ICD-10-CM | POA: Diagnosis not present

## 2019-06-10 DIAGNOSIS — M6281 Muscle weakness (generalized): Secondary | ICD-10-CM

## 2019-06-10 NOTE — Therapy (Signed)
[place], [place], [address] Phone: [phone]   Fax:  [phone]  Physical Therapy Treatment  Patient Details  Name: Audrey Weeks MRN: [id number] Date of Birth: 1960-05-07 Referring Provider (PT): Danae Orleans PA-C   Encounter Date: 06/10/2019  PT End of Session - 06/10/19 1201    Visit Number  7    Number of Visits  18    Date for PT Re-Evaluation  07/02/19    Authorization Type  UHC Medicare; Medicaid secondary    Progress Note Due on Visit  10    PT Start Time  1135    PT Stop Time  1215    PT Time Calculation (min)  40 min    Activity Tolerance  Patient tolerated treatment well    Behavior During Therapy  [facility] for tasks assessed/performed       Past Medical History:  Diagnosis Date  . Anxiety   . Arthritis   . Asthma   . Migraines   . Pneumonia     Past Surgical History:  Procedure Laterality Date  . CESAREAN SECTION    . TOTAL KNEE ARTHROPLASTY Right 05/18/2019   Procedure: TOTAL KNEE ARTHROPLASTY;  Surgeon: Paralee Cancel, MD;  Location: [facility];  Service: Orthopedics;  Laterality: Right;  20mins    There were no vitals filed for this visit.  Subjective Assessment - 06/10/19 1143    Subjective  pt states she is not doing as well today.  Pain 8-9/10 and states she is sore.  Reports compliance with HEP.  Noted antalgia upon entrance.    Currently in Pain?  Yes    Pain Score  8     Pain Location  Knee    Pain Orientation  Right    Pain Descriptors / Indicators  Aching                        [facility] Adult PT Treatment/Exercise - 06/10/19 0001      Knee/Hip Exercises: Stretches   Knee: Self-Stretch to increase Flexion  Right;5 reps;10 seconds    Knee: Self-Stretch Limitations  on 12 inch box     Gastroc Stretch  30 seconds;3 reps    Gastroc Stretch Limitations  slantboard      Knee/Hip Exercises: Aerobic   Stationary Bike  seat 10 full revolutions after several  rocks 4 minutes      Knee/Hip Exercises: Standing   Heel Raises  Both;15 reps    Knee Flexion  Right;15 reps      Knee/Hip Exercises: Supine   Knee Extension  AROM;Right    Knee Extension Limitations  5    Knee Flexion  AROM;Right    Knee Flexion Limitations  110      Manual Therapy   Manual Therapy  Soft tissue mobilization    Manual therapy comments  Manual treatment performed separate from all other activity     Soft tissue mobilization  to posterior knee and mm in prone positioning               PT Short Term Goals - 05/31/19 1239      PT SHORT TERM GOAL #1   Title  Patient will be independent with initial HEP and self-management strategies to improve functional outcomes    Time  3    Period  Weeks    Status  New    Target Date  06/11/19  PT SHORT TERM GOAL #2   Title  Patient will have Rt knee AROM 5-100 degrees to improve functional mobility and facilitate squatting to pick up items from floor.    Time  3    Period  Weeks    Status  New    Target Date  06/11/19        PT Long Term Goals - 05/31/19 1239      PT LONG TERM GOAL #1   Title  Patient will have RT knee AROM 0-120 degrees to improve functional mobility and facilitate squatting to pick up items from floor.    Time  6    Period  Weeks    Status  New      PT LONG TERM GOAL #2   Title  Patient will report at least 85% overall improvement in subjective complaint to indicate improvement in ability to perform ADLs.    Time  6    Period  Weeks    Status  New      PT LONG TERM GOAL #3   Title  Patient will have equal to or > 4+/5 MMT throughout BLEs to improve ability to perform functional mobility, stair ambulation and ADLs.    Time  6    Period  Weeks    Status  New      PT LONG TERM GOAL #4   Title  Patient will improve FOTO score by 10% to indicate improvement in functional outcomes    Time  6    Period  Weeks    Status  New            Plan - 06/10/19 1212    Clinical  Impression Statement  Pt with increased irritation in posterior knee and thigh, specifically medial hamstring region.  completed ROM exercises amd then focused on reducing pain and discomfort.  Manual completed in prone this session with noted tightness at medial hamstring and posterior knee.  Did check homans sign which was negative.  Encouraged pateint to continue stretching focus at home over the weekend.  Pt requested to cancel tomorrow's appt as it was back to back.    Examination-Activity Limitations  Bathing;Sit;Bed Mobility;Sleep;Bend;Squat;Caring for Others;Stairs;Carry;Stand;Toileting;Dressing;Transfers;Hygiene/Grooming;Lift;Locomotion Level    Examination-Participation Restrictions  Laundry;Yard Work;Driving;Community Activity;Cleaning    Stability/Clinical Decision Making  Stable/Uncomplicated    Rehab Potential  Good    PT Frequency  3x / week    PT Duration  6 weeks    PT Treatment/Interventions  ADLs/Self Care Home Management;Ultrasound;Neuromuscular re-education;Compression bandaging;Joint Manipulations;Passive range of motion;Scar mobilization;Parrafin;Fluidtherapy;Aquatic Therapy;Contrast Bath;Biofeedback;Patient/family education;DME Instruction;Gait training;Orthotic Fit/Training;Dry needling;Energy conservation;Splinting;Stair training;Cryotherapy;Electrical Stimulation;Functional mobility training;Iontophoresis 4mg /ml Dexamethasone;Therapeutic activities;Therapeutic exercise;Moist Heat;Traction;Balance training;Manual lymph drainage;Manual techniques;Vasopneumatic Device;Taping;Wheelchair mobility training    PT Next Visit Plan  Progress hip and knee strength, and knee ROM as tolerated. Begin sidestepping next visit.    PT Home Exercise Plan  05/21/19: quad set, heel slide, ankle pump; 05/24/19: SLR, glute set; 05/31/19 - standing calf stretch    Consulted and Agree with Plan of Care  Patient       Patient will benefit from skilled therapeutic intervention in order to improve the  following deficits and impairments:  Abnormal gait, Decreased endurance, Hypomobility, Decreased scar mobility, Increased edema, Decreased activity tolerance, Decreased strength, Pain, Decreased balance, Decreased mobility, Difficulty walking, Decreased range of motion, Improper body mechanics, Impaired flexibility  Visit Diagnosis: Stiffness of right knee, not elsewhere classified  Muscle weakness (generalized)  Other abnormalities of gait and mobility  Problem List Patient Active Problem List   Diagnosis Date Noted  . S/P right TKA 05/18/2019  . OBESITY 05/10/2009  . ANXIETY 05/10/2009  . MIGRAINE HEADACHE 04/15/2009  . PNEUMONIA, COMMUNITY ACQUIRED, PNEUMOCOCCAL 04/15/2009  . ASTHMA 04/15/2009   Teena Irani, PTA/CLT [phone]  Teena Irani 06/10/2019, 12:16 PM  [place], [place], [address] Phone: [phone]   Fax:  [phone]  Name: Audrey Weeks MRN: [id number] Date of Birth: 12-02-1960

## 2019-06-11 ENCOUNTER — Encounter (HOSPITAL_COMMUNITY): Payer: Medicare Other | Admitting: Physical Therapy

## 2019-06-14 ENCOUNTER — Encounter (HOSPITAL_COMMUNITY): Payer: Medicare Other | Admitting: Physical Therapy

## 2019-06-16 ENCOUNTER — Ambulatory Visit (HOSPITAL_COMMUNITY): Payer: Medicare Other | Admitting: Physical Therapy

## 2019-06-16 ENCOUNTER — Other Ambulatory Visit: Payer: Self-pay

## 2019-06-16 ENCOUNTER — Encounter (HOSPITAL_COMMUNITY): Payer: Self-pay | Admitting: Physical Therapy

## 2019-06-16 DIAGNOSIS — R2689 Other abnormalities of gait and mobility: Secondary | ICD-10-CM

## 2019-06-16 DIAGNOSIS — M25661 Stiffness of right knee, not elsewhere classified: Secondary | ICD-10-CM

## 2019-06-16 DIAGNOSIS — M6281 Muscle weakness (generalized): Secondary | ICD-10-CM

## 2019-06-16 NOTE — Therapy (Signed)
[place], [place], [address] Phone: [phone]   Fax:  [phone]  Physical Therapy Treatment  Patient Details  Name: Audrey Weeks MRN: [id number] Date of Birth: 12-Jan-1960 Referring Provider (PT): Danae Orleans PA-C   Encounter Date: 06/16/2019  PT End of Session - 06/16/19 1403    Visit Number  8    Number of Visits  18    Date for PT Re-Evaluation  07/02/19    Authorization Type  UHC Medicare; Medicaid secondary    Progress Note Due on Visit  10    PT Start Time  1348    PT Stop Time  1430    PT Time Calculation (min)  42 min    Activity Tolerance  Patient tolerated treatment well    Behavior During Therapy  [facility] for tasks assessed/performed       Past Medical History:  Diagnosis Date  . Anxiety   . Arthritis   . Asthma   . Migraines   . Pneumonia     Past Surgical History:  Procedure Laterality Date  . CESAREAN SECTION    . TOTAL KNEE ARTHROPLASTY Right 05/18/2019   Procedure: TOTAL KNEE ARTHROPLASTY;  Surgeon: Paralee Cancel, MD;  Location: [facility];  Service: Orthopedics;  Laterality: Right;  11mns    There were no vitals filed for this visit.  Subjective Assessment - 06/16/19 1400    Subjective  Patient says she "doesn't have pain in her knee". Says she is still in a lot of pain, describes pain at posterior aspect of RT leg today.    Currently in Pain?  Yes    Pain Score  8     Pain Location  Knee    Pain Orientation  Right    Pain Descriptors / Indicators  --   nerve pain   Pain Type  Surgical pain                        [facility] Adult PT Treatment/Exercise - 06/16/19 0001      Knee/Hip Exercises: Stretches   Knee: Self-Stretch to increase Flexion  Right;5 reps;10 seconds    Knee: Self-Stretch Limitations  on 12 inch box     Gastroc Stretch  30 seconds;3 reps    Gastroc Stretch Limitations  slantboard      Knee/Hip Exercises: Aerobic   Stationary Bike  seat 10  full revolutions 4 minutes      Knee/Hip Exercises: Standing   Heel Raises  Both;15 reps    Forward Step Up  Right;1 set;10 reps;Hand Hold: 1;Step Height: 6"    Other Standing Knee Exercises  tandem stance 2 x 30" solid floor       Knee/Hip Exercises: Seated   Sit to Sand  1 set;10 reps;without UE support      Knee/Hip Exercises: Supine   Heel Slides  Right;5 reps    Knee Extension  AROM;Right    Knee Extension Limitations  4    Knee Flexion  AROM;Right    Knee Flexion Limitations  111      Manual Therapy   Manual Therapy  Soft tissue mobilization    Manual therapy comments  Manual treatment performed separate from all other activity     Soft tissue mobilization  IASTM to posterior RT calf and hamstring , patient in prone                PT Short Term  Goals - 05/31/19 1239      PT SHORT TERM GOAL #1   Title  Patient will be independent with initial HEP and self-management strategies to improve functional outcomes    Time  3    Period  Weeks    Status  New    Target Date  06/11/19      PT SHORT TERM GOAL #2   Title  Patient will have Rt knee AROM 5-100 degrees to improve functional mobility and facilitate squatting to pick up items from floor.    Time  3    Period  Weeks    Status  New    Target Date  06/11/19        PT Long Term Goals - 05/31/19 1239      PT LONG TERM GOAL #1   Title  Patient will have RT knee AROM 0-120 degrees to improve functional mobility and facilitate squatting to pick up items from floor.    Time  6    Period  Weeks    Status  New      PT LONG TERM GOAL #2   Title  Patient will report at least 85% overall improvement in subjective complaint to indicate improvement in ability to perform ADLs.    Time  6    Period  Weeks    Status  New      PT LONG TERM GOAL #3   Title  Patient will have equal to or > 4+/5 MMT throughout BLEs to improve ability to perform functional mobility, stair ambulation and ADLs.    Time  6    Period   Weeks    Status  New      PT LONG TERM GOAL #4   Title  Patient will improve FOTO score by 10% to indicate improvement in functional outcomes    Time  [place] - 06/16/19 1628    Clinical Impression Statement  Patient progressing well toward therapy goals, though continues to report elevated pain levels. Patient making slow, steady progress with AROM, though verbalizes frustration that she has not yet met her long-term therapy goals. Patient was able to progress to 6-inch step height, though required HHA x 1 for stability. Patient showed good static balance with tandem stance today. Added IASTM to address pain and restriction in posterior RT knee area per patient subjective complaint. Patient noted decreased pain with ambulation afterward. Patient will continue to benefit from skilled therapy services to progress knee strength and mobility to reduce pain and improve functional mobility.    Examination-Activity Limitations  Bathing;Sit;Bed Mobility;Sleep;Bend;Squat;Caring for Others;Stairs;Carry;Stand;Toileting;Dressing;Transfers;Hygiene/Grooming;Lift;Locomotion Level    Examination-Participation Restrictions  Laundry;Yard Work;Driving;Community Activity;Cleaning    Stability/Clinical Decision Making  Stable/Uncomplicated    Rehab Potential  Good    PT Frequency  3x / week    PT Duration  6 weeks    PT Treatment/Interventions  ADLs/Self Care Home Management;Ultrasound;Neuromuscular re-education;Compression bandaging;Joint Manipulations;Passive range of motion;Scar mobilization;Parrafin;Fluidtherapy;Aquatic Therapy;Contrast Bath;Biofeedback;Patient/family education;DME Instruction;Gait training;Orthotic Fit/Training;Dry needling;Energy conservation;Splinting;Stair training;Cryotherapy;Electrical Stimulation;Functional mobility training;Iontophoresis '4mg'$ /ml Dexamethasone;Therapeutic activities;Therapeutic exercise;Moist Heat;Traction;Balance  training;Manual lymph drainage;Manual techniques;Vasopneumatic Device;Taping;Wheelchair mobility training    PT Next Visit Plan  Progress hip and knee strength, and knee ROM as tolerated. Begin sidestepping next visit.    PT Home Exercise Plan  05/21/19: quad set, heel slide, ankle pump; 05/24/19: SLR, glute set; 05/31/19 - standing calf stretch  Consulted and Agree with Plan of Care  Patient       Patient will benefit from skilled therapeutic intervention in order to improve the following deficits and impairments:  Abnormal gait, Decreased endurance, Hypomobility, Decreased scar mobility, Increased edema, Decreased activity tolerance, Decreased strength, Pain, Decreased balance, Decreased mobility, Difficulty walking, Decreased range of motion, Improper body mechanics, Impaired flexibility  Visit Diagnosis: Stiffness of right knee, not elsewhere classified  Muscle weakness (generalized)  Other abnormalities of gait and mobility     Problem List Patient Active Problem List   Diagnosis Date Noted  . S/P right TKA 05/18/2019  . OBESITY 05/10/2009  . ANXIETY 05/10/2009  . MIGRAINE HEADACHE 04/15/2009  . PNEUMONIA, COMMUNITY ACQUIRED, PNEUMOCOCCAL 04/15/2009  . ASTHMA 04/15/2009    4:31 PM, 06/16/19 Josue Hector PT DPT  Physical Therapist with [facility]  [phone] [place] [address] [place], [place], [address] Phone: [phone]   Fax:  [phone]  Name: NATIA FAHMY MRN: [id number] Date of Birth: [birth year]

## 2019-06-18 ENCOUNTER — Ambulatory Visit (HOSPITAL_COMMUNITY): Payer: Medicare Other | Admitting: Physical Therapy

## 2019-06-18 ENCOUNTER — Telehealth (HOSPITAL_COMMUNITY): Payer: Self-pay | Admitting: Physical Therapy

## 2019-06-18 NOTE — Telephone Encounter (Signed)
No show. Called and left VM on machine about missed appointment and reminded patient about upcoming appointment and to call and cancel if she cannot make it.   12:03 PM, 06/18/19 Jerene Pitch, DPT Physical Therapy with [facility]  [phone] office

## 2019-06-21 ENCOUNTER — Other Ambulatory Visit: Payer: Self-pay

## 2019-06-21 ENCOUNTER — Encounter (HOSPITAL_COMMUNITY): Payer: Self-pay | Admitting: Physical Therapy

## 2019-06-21 ENCOUNTER — Ambulatory Visit (HOSPITAL_COMMUNITY): Payer: Medicare Other | Admitting: Physical Therapy

## 2019-06-21 DIAGNOSIS — M25661 Stiffness of right knee, not elsewhere classified: Secondary | ICD-10-CM | POA: Diagnosis not present

## 2019-06-21 DIAGNOSIS — M6281 Muscle weakness (generalized): Secondary | ICD-10-CM

## 2019-06-21 DIAGNOSIS — R2689 Other abnormalities of gait and mobility: Secondary | ICD-10-CM

## 2019-06-21 NOTE — Therapy (Signed)
[place], [place], [address] Phone: [phone]   Fax:  [phone]  Physical Therapy Treatment  Patient Details  Name: Audrey Weeks MRN: [id number] Date of Birth: [birth year] Referring Provider (PT): Danae Orleans PA-C   Encounter Date: 06/21/2019   PT End of Session - 06/21/19 1636    Visit Number 9    Number of Visits 18    Date for PT Re-Evaluation 07/02/19    Authorization Type UHC Medicare; Medicaid secondary    Progress Note Due on Visit 10    PT Start Time 1030    PT Stop Time 1115    PT Time Calculation (min) 45 min    Activity Tolerance Patient limited by pain    Behavior During Therapy [facility] for tasks assessed/performed           Past Medical History:  Diagnosis Date  . Anxiety   . Arthritis   . Asthma   . Migraines   . Pneumonia     Past Surgical History:  Procedure Laterality Date  . CESAREAN SECTION    . TOTAL KNEE ARTHROPLASTY Right 05/18/2019   Procedure: TOTAL KNEE ARTHROPLASTY;  Surgeon: Paralee Cancel, MD;  Location: [facility];  Service: Orthopedics;  Laterality: Right;  91mins    There were no vitals filed for this visit.   Subjective Assessment - 06/21/19 1041    Subjective Patient states "My knee doesn't hurt, around my knee hurts". Says she is also having increased low back pain lately. Says her knee pain was masking some of this she feels.    Limitations Sitting;Lifting;Standing;Walking;House hold activities    Currently in Pain? Yes    Pain Score 7     Pain Location Knee    Pain Orientation Right    Pain Descriptors / Indicators Sore;Aching    Pain Type Surgical pain                             [facility] Adult PT Treatment/Exercise - 06/21/19 0001      Knee/Hip Exercises: Stretches   Knee: Self-Stretch to increase Flexion Right;5 reps;10 seconds    Knee: Self-Stretch Limitations on 12 inch box     Gastroc Stretch 30 seconds;3 reps    Gastroc  Stretch Limitations slantboard      Knee/Hip Exercises: Aerobic   Stationary Bike seat 10 full revolutions 4 minutes      Knee/Hip Exercises: Standing   Heel Raises Both;15 reps    Knee Flexion Right;1 set;15 reps    Lateral Step Up Right;1 set;10 reps;Hand Hold: 1;Step Height: 6"    Forward Step Up Right;1 set;10 reps;Hand Hold: 1;Step Height: 6"    Step Down Right;1 set;10 reps;Step Height: 4";Hand Hold: 2    SLS 2 x 10 sec hold with intermittent HHA     Other Standing Knee Exercises tandem stance 2 x 30" solid floor       Knee/Hip Exercises: Seated   Sit to Sand 1 set;10 reps;without UE support      Knee/Hip Exercises: Supine   Heel Slides Right;5 reps    Knee Extension AROM;Right    Knee Extension Limitations 4    Knee Flexion AROM;Right    Knee Flexion Limitations 116      Manual Therapy   Manual Therapy Soft tissue mobilization    Manual therapy comments Manual treatment performed separate from all other activity     Soft  tissue mobilization IASTM to posterior RT calf and hamstring , patient in prone                     PT Short Term Goals - 05/31/19 1239      PT SHORT TERM GOAL #1   Title Patient will be independent with initial HEP and self-management strategies to improve functional outcomes    Time 3    Period Weeks    Status New    Target Date 06/11/19      PT SHORT TERM GOAL #2   Title Patient will have Rt knee AROM 5-100 degrees to improve functional mobility and facilitate squatting to pick up items from floor.    Time 3    Period Weeks    Status New    Target Date 06/11/19             PT Long Term Goals - 05/31/19 1239      PT LONG TERM GOAL #1   Title Patient will have RT knee AROM 0-120 degrees to improve functional mobility and facilitate squatting to pick up items from floor.    Time 6    Period Weeks    Status New      PT LONG TERM GOAL #2   Title Patient will report at least 85% overall improvement in subjective complaint to  indicate improvement in ability to perform ADLs.    Time 6    Period Weeks    Status New      PT LONG TERM GOAL #3   Title Patient will have equal to or > 4+/5 MMT throughout BLEs to improve ability to perform functional mobility, stair ambulation and ADLs.    Time 6    Period Weeks    Status New      PT LONG TERM GOAL #4   Title Patient will improve FOTO score by 10% to indicate improvement in functional outcomes    Time 6    Period Weeks    Status New                 Plan - 06/21/19 1637    Clinical Impression Statement Patient describes increased pain with stretching activity today. Discussed difference between stretching and pain as well as comparison to muscle soreness. Patient verbalized understanding. Patient able to progress step height today with minimal complaint, but continues to require HHA for stability. Patient well challenged with added SLS today, but with good tolerance. Patient required HHA x 1 to maintain balance. Patient progressing well toward AROM goals. Patient will continue to benefit from skilled therapy services to progress knee strength and mobility to reduce pain and improve LOF with ADLs.    Examination-Activity Limitations Bathing;Sit;Bed Mobility;Sleep;Bend;Squat;Caring for Others;Stairs;Carry;Stand;Toileting;Dressing;Transfers;Hygiene/Grooming;Lift;Locomotion Level    Examination-Participation Restrictions Laundry;Yard Work;Driving;Community Activity;Cleaning    Stability/Clinical Decision Making Stable/Uncomplicated    Rehab Potential Good    PT Frequency 3x / week    PT Duration 6 weeks    PT Treatment/Interventions ADLs/Self Care Home Management;Ultrasound;Neuromuscular re-education;Compression bandaging;Joint Manipulations;Passive range of motion;Scar mobilization;Parrafin;Fluidtherapy;Aquatic Therapy;Contrast Bath;Biofeedback;Patient/family education;DME Instruction;Gait training;Orthotic Fit/Training;Dry needling;Energy  conservation;Splinting;Stair training;Cryotherapy;Electrical Stimulation;Functional mobility training;Iontophoresis 4mg /ml Dexamethasone;Therapeutic activities;Therapeutic exercise;Moist Heat;Traction;Balance training;Manual lymph drainage;Manual techniques;Vasopneumatic Device;Taping;Wheelchair mobility training    PT Next Visit Plan Progress hip and knee strength, and knee ROM as tolerated. Begin sidestepping next visit.    PT Home Exercise Plan 05/21/19: quad set, heel slide, ankle pump; 05/24/19: SLR, glute set; 05/31/19 - standing calf stretch    Consulted  and Agree with Plan of Care Patient           Patient will benefit from skilled therapeutic intervention in order to improve the following deficits and impairments:  Abnormal gait, Decreased endurance, Hypomobility, Decreased scar mobility, Increased edema, Decreased activity tolerance, Decreased strength, Pain, Decreased balance, Decreased mobility, Difficulty walking, Decreased range of motion, Improper body mechanics, Impaired flexibility  Visit Diagnosis: Stiffness of right knee, not elsewhere classified  Muscle weakness (generalized)  Other abnormalities of gait and mobility     Problem List Patient Active Problem List   Diagnosis Date Noted  . S/P right TKA 05/18/2019  . OBESITY 05/10/2009  . ANXIETY 05/10/2009  . MIGRAINE HEADACHE 04/15/2009  . PNEUMONIA, COMMUNITY ACQUIRED, PNEUMOCOCCAL 04/15/2009  . ASTHMA 04/15/2009    4:42 PM, 06/21/19 Josue Hector PT DPT  Physical Therapist with [facility]  [phone] [place] [address] [place], [place], [address] Phone: [phone]   Fax:  [phone]  Name: Audrey Weeks MRN: [id number] Date of Birth: 31-Aug-1960

## 2019-06-23 ENCOUNTER — Ambulatory Visit (HOSPITAL_COMMUNITY): Payer: Medicare Other | Admitting: Physical Therapy

## 2019-06-25 ENCOUNTER — Telehealth (HOSPITAL_COMMUNITY): Payer: Self-pay | Admitting: Physical Therapy

## 2019-06-25 ENCOUNTER — Ambulatory Visit (HOSPITAL_COMMUNITY): Payer: Medicare Other | Admitting: Physical Therapy

## 2019-06-25 NOTE — Telephone Encounter (Signed)
He had a death in the family and will not be here today - She wants Cam to know that she has a Home Nurse appt on Monday between 10-2pm-she will be here on Monday at 4:45pm. Ortho Appt on 6/24 with Dr. Alvan Dame

## 2019-06-28 ENCOUNTER — Ambulatory Visit (HOSPITAL_COMMUNITY): Payer: Medicare Other | Admitting: Physical Therapy

## 2019-06-28 ENCOUNTER — Other Ambulatory Visit: Payer: Self-pay

## 2019-06-28 DIAGNOSIS — M25661 Stiffness of right knee, not elsewhere classified: Secondary | ICD-10-CM

## 2019-06-28 DIAGNOSIS — R2689 Other abnormalities of gait and mobility: Secondary | ICD-10-CM

## 2019-06-28 DIAGNOSIS — M6281 Muscle weakness (generalized): Secondary | ICD-10-CM

## 2019-06-28 NOTE — Therapy (Signed)
[facility] [address] [place], [place], [address] Phone: [phone]   Fax:  [phone]  Physical Therapy Treatment/ Progress Note  Patient Details  Name: Audrey Weeks MRN: [id number] Date of Birth: [birth year] Referring Provider (PT): Lanney Gins PA-C   Encounter Date: 06/28/2019  Progress Note Reporting Period 05/21/19 to 06/28/19  See note below for Objective Data and Assessment of Progress/Goals.       PT End of Session - 06/28/19 1702    Visit Number 10    Number of Visits 18    Date for PT Re-Evaluation 07/23/19    Authorization Type UHC Medicare; Medicaid secondary    Progress Note Due on Visit 16    PT Start Time 1650    PT Stop Time 1730    PT Time Calculation (min) 40 min    Activity Tolerance Patient tolerated treatment well    Behavior During Therapy [facility] for tasks assessed/performed           Past Medical History:  Diagnosis Date  . Anxiety   . Arthritis   . Asthma   . Migraines   . Pneumonia     Past Surgical History:  Procedure Laterality Date  . CESAREAN SECTION    . TOTAL KNEE ARTHROPLASTY Right 05/18/2019   Procedure: TOTAL KNEE ARTHROPLASTY;  Surgeon: Durene Romans, MD;  Location: [facility];  Service: Orthopedics;  Laterality: Right;     There were no vitals filed for this visit.   Subjective Assessment - 06/28/19 1701    Subjective Patient says she is concerned something is wrong with her hamstring because she still has pain in this area. She says she has just started having sharp pains around RT knee in the past several days. Says these are random pains. Follows up with ortho on Thursday. Says she feels near 100% improvement with knee function, still limited by pain.    Limitations Sitting;Lifting;Standing;Walking;House hold activities    Currently in Pain? Yes    Pain Score 7     Pain Location Knee    Pain Orientation Right    Pain Descriptors / Indicators Aching;Sharp    Pain  Type Surgical pain    Pain Onset More than a month ago    Pain Frequency Constant              [facility] PT Assessment - 06/28/19 0001      Assessment   Medical Diagnosis RT TKA     Referring Provider (PT) Lanney Gins PA-C    Next MD Visit 07/01/19    Prior Therapy yes, RT knee prior to surgery       Precautions   Precautions Fall      Restrictions   Weight Bearing Restrictions No      Balance Screen   Has the patient fallen in the past 6 months No   not since knee surgery      Home Environment   Living Environment Private residence      Prior Function   Level of Independence Independent      Cognition   Overall Cognitive Status Within Functional Limits for tasks assessed      Observation/Other Assessments   Focus on Therapeutic Outcomes (FOTO)  28% limited    was 38% limited      Sensation   Light Touch Appears Intact      AROM   Right Knee Extension 3   was 10   Right Knee Flexion  117   65     Strength   Right Hip Flexion 4+/5   was 3-   Right Hip Extension 4/5    Right Hip ABduction 4/5    Left Hip Flexion 4+/5   was 4   Left Hip Extension 4-/5    Left Hip ABduction 4/5    Right Knee Flexion 4+/5   was 3+   Right Knee Extension 4+/5   was 3-   Left Knee Flexion 4+/5    Left Knee Extension 4+/5      Ambulation/Gait   Ambulation/Gait Yes    Ambulation/Gait Assistance 7: Independent    Ambulation Distance (Feet) 400 Feet    Assistive device None    Gait Pattern Decreased stance time - right;Decreased stride length    Ambulation Surface Level;Indoor    Stairs Yes    Stairs Assistance 6: Modified independent (Device/Increase time)    Stair Management Technique One rail Right;Alternating pattern    Number of Stairs 12    Height of Stairs 7      Balance   Balance Assessed Yes      Static Standing Balance   Static Standing Balance -  Activities  Single Leg Stance - Right Leg;Single Leg Stance - Left Leg    Static Standing - Comment/# of Minutes 6  sec, 25 sec                          [facility] Adult PT Treatment/Exercise - 06/28/19 0001      Knee/Hip Exercises: Stretches   Knee: Self-Stretch to increase Flexion Right;5 reps;10 seconds    Knee: Self-Stretch Limitations on 12 inch box     Gastroc Stretch 30 seconds;3 reps    Gastroc Stretch Limitations slantboard      Knee/Hip Exercises: Aerobic   Stationary Bike seat 8 full revolutions 4 minutes      Knee/Hip Exercises: Supine   Heel Slides Right;5 reps                  PT Education - 06/28/19 1703    Education Details on reassessment findings and POC    Person(s) Educated Patient    Methods Explanation    Comprehension Verbalized understanding            PT Short Term Goals - 06/28/19 1730      PT SHORT TERM GOAL #1   Title Patient will be independent with initial HEP and self-management strategies to improve functional outcomes    Baseline Reports compliance    Time 3    Period Weeks    Status Achieved    Target Date 06/11/19      PT SHORT TERM GOAL #2   Title Patient will have Rt knee AROM 5-100 degrees to improve functional mobility and facilitate squatting to pick up items from floor.    Baseline Current : 3-117 degrees    Time 3    Period Weeks    Status Achieved    Target Date 06/11/19             PT Long Term Goals - 06/28/19 1731      PT LONG TERM GOAL #1   Title Patient will have RT knee AROM 0-120 degrees to improve functional mobility and facilitate squatting to pick up items from floor.    Baseline Current : 3-117 degrees    Time 6    Period Weeks    Status On-going  PT LONG TERM GOAL #2   Title Patient will report at least 85% overall improvement in subjective complaint to indicate improvement in ability to perform ADLs.    Baseline Reports nearly 100%    Time 6    Period Weeks    Status Achieved      PT LONG TERM GOAL #3   Title Patient will have equal to or > 4+/5 MMT throughout BLEs to improve ability  to perform functional mobility, stair ambulation and ADLs.    Baseline See MMT    Time 6    Period Weeks    Status Partially Met      PT LONG TERM GOAL #4   Title Patient will improve FOTO score by 10% to indicate improvement in functional outcomes    Baseline Current 10% improved    Time 6    Period Weeks    Status Achieved                 Plan - 06/28/19 1739    Clinical Impression Statement Patient is making good progress toward therapy goals. Patient currently with 2/2 short term and  long term goals met/ partially met. Patient shows significant improvement in ambulation, and strength. Patient remains mildly limited by decreased balance, slight AROM restriction, and elevated pain level with activity which continue to negatively impact functional ability. Patient will continue to benefit from skilled therapy services to address remaining deficits to reduce pain level and improve LOF with ADLs and functional mobility tasks.    Examination-Activity Limitations Bathing;Sit;Bed Mobility;Sleep;Bend;Squat;Caring for Others;Stairs;Carry;Stand;Toileting;Dressing;Transfers;Hygiene/Grooming;Lift;Locomotion Level    Examination-Participation Restrictions Laundry;Yard Work;Driving;Community Activity;Cleaning    Stability/Clinical Decision Making Stable/Uncomplicated    Rehab Potential Good    PT Frequency 2x / week    PT Duration 6 weeks    PT Treatment/Interventions ADLs/Self Care Home Management;Ultrasound;Neuromuscular re-education;Compression bandaging;Joint Manipulations;Passive range of motion;Scar mobilization;Parrafin;Fluidtherapy;Aquatic Therapy;Contrast Bath;Biofeedback;Patient/family education;DME Instruction;Gait training;Orthotic Fit/Training;Dry needling;Energy conservation;Splinting;Stair training;Cryotherapy;Electrical Stimulation;Functional mobility training;Iontophoresis '4mg'$ /ml Dexamethasone;Therapeutic activities;Therapeutic exercise;Moist Heat;Traction;Balance  training;Manual lymph drainage;Manual techniques;Vasopneumatic Device;Taping;Wheelchair mobility training    PT Next Visit Plan Progress hip and knee strength, and knee ROM as tolerated. Begin sidestepping next visit, balance on compliant surface.    PT Home Exercise Plan 05/21/19: quad set, heel slide, ankle pump; 05/24/19: SLR, glute set; 05/31/19 - standing calf stretch    Consulted and Agree with Plan of Care Patient           Patient will benefit from skilled therapeutic intervention in order to improve the following deficits and impairments:  Abnormal gait, Decreased endurance, Hypomobility, Decreased scar mobility, Increased edema, Decreased activity tolerance, Decreased strength, Pain, Decreased balance, Decreased mobility, Difficulty walking, Decreased range of motion, Improper body mechanics, Impaired flexibility  Visit Diagnosis: Stiffness of right knee, not elsewhere classified  Muscle weakness (generalized)  Other abnormalities of gait and mobility     Problem List Patient Active Problem List   Diagnosis Date Noted  . S/P right TKA 05/18/2019  . OBESITY 05/10/2009  . ANXIETY 05/10/2009  . MIGRAINE HEADACHE 04/15/2009  . PNEUMONIA, COMMUNITY ACQUIRED, PNEUMOCOCCAL 04/15/2009  . ASTHMA 04/15/2009   6:40 PM, 06/28/19 Josue Hector PT DPT  Physical Therapist with [facility]  [phone] [place] [address] [place], [place], [address] Phone: [phone]   Fax:  [phone]  Name: LITTIE CHIEM MRN: [id number] Date of Birth: 1960/04/01

## 2019-06-30 ENCOUNTER — Ambulatory Visit (HOSPITAL_COMMUNITY): Payer: Medicare Other | Admitting: Physical Therapy

## 2019-06-30 ENCOUNTER — Encounter (HOSPITAL_COMMUNITY): Payer: Self-pay | Admitting: Physical Therapy

## 2019-06-30 ENCOUNTER — Other Ambulatory Visit: Payer: Self-pay

## 2019-06-30 DIAGNOSIS — M25661 Stiffness of right knee, not elsewhere classified: Secondary | ICD-10-CM

## 2019-06-30 DIAGNOSIS — R2689 Other abnormalities of gait and mobility: Secondary | ICD-10-CM

## 2019-06-30 DIAGNOSIS — M6281 Muscle weakness (generalized): Secondary | ICD-10-CM

## 2019-06-30 NOTE — Therapy (Signed)
[place], [place], [address] Phone: [phone]   Fax:  [phone]  Physical Therapy Treatment  Patient Details  Name: Audrey Weeks MRN: [id number] Date of Birth: 1960-11-24 Referring Provider (PT): Danae Orleans PA-C   Encounter Date: 06/30/2019   PT End of Session - 06/30/19 1656    Visit Number 11    Number of Visits 18    Date for PT Re-Evaluation 07/23/19    Authorization Type UHC Medicare; Medicaid secondary    Progress Note Due on Visit 16    PT Start Time 1649    PT Stop Time 1732    PT Time Calculation (min) 43 min    Activity Tolerance Patient tolerated treatment well    Behavior During Therapy [facility] for tasks assessed/performed           Past Medical History:  Diagnosis Date  . Anxiety   . Arthritis   . Asthma   . Migraines   . Pneumonia     Past Surgical History:  Procedure Laterality Date  . CESAREAN SECTION    . TOTAL KNEE ARTHROPLASTY Right 05/18/2019   Procedure: TOTAL KNEE ARTHROPLASTY;  Surgeon: Paralee Cancel, MD;  Location: [facility];  Service: Orthopedics;  Laterality: Right;  51mns    There were no vitals filed for this visit.   Subjective Assessment - 06/30/19 1654    Subjective Patient says knee is feeling better. "about a 5 today". Still bothers her at night when sleeping. Says hamstring pain has decreased.    Limitations Sitting;Lifting;Standing;Walking;House hold activities    Currently in Pain? Yes    Pain Score 5     Pain Location Knee    Pain Orientation Right    Pain Descriptors / Indicators Aching    Pain Type Surgical pain    Pain Onset More than a month ago    Pain Frequency Constant                             [facility] Adult PT Treatment/Exercise - 06/30/19 0001      Knee/Hip Exercises: Stretches   QSports administratorRight;3 reps;30 seconds    Quad Stretch Limitations with strap    Knee: Self-Stretch to increase Flexion Right;5 reps;10  seconds    Knee: Self-Stretch Limitations on 2nd step    Gastroc Stretch 30 seconds;3 reps    Gastroc Stretch Limitations slantboard      Knee/Hip Exercises: Aerobic   Stationary Bike seat 8 full revolutions 4 minutes Lv 3       Knee/Hip Exercises: Standing   Heel Raises Both;15 reps    Functional Squat 2 sets;10 reps    Functional Squat Limitations chair for depth cue     Stairs 7 inch 5RT reciprocal gait single hand rail     SLS 3 x 15" each    Other Standing Knee Exercises sidestepping 15' 2 RT       Knee/Hip Exercises: Supine   Knee Extension AROM;Right    Knee Extension Limitations 4    Knee Flexion AROM;Right    Knee Flexion Limitations 116      Manual Therapy   Manual Therapy Soft tissue mobilization    Manual therapy comments Manual treatment performed separate from all other activity     Soft tissue mobilization IASTM to posterior RT calf and hamstring , patient in prone     Myofascial Release --  PT Short Term Goals - 06/28/19 1730      PT SHORT TERM GOAL #1   Title Patient will be independent with initial HEP and self-management strategies to improve functional outcomes    Baseline Reports compliance    Time 3    Period Weeks    Status Achieved    Target Date 06/11/19      PT SHORT TERM GOAL #2   Title Patient will have Rt knee AROM 5-100 degrees to improve functional mobility and facilitate squatting to pick up items from floor.    Baseline Current : 3-117 degrees    Time 3    Period Weeks    Status Achieved    Target Date 06/11/19             PT Long Term Goals - 06/28/19 1731      PT LONG TERM GOAL #1   Title Patient will have RT knee AROM 0-120 degrees to improve functional mobility and facilitate squatting to pick up items from floor.    Baseline Current : 3-117 degrees    Time 6    Period Weeks    Status On-going      PT LONG TERM GOAL #2   Title Patient will report at least 85% overall improvement in  subjective complaint to indicate improvement in ability to perform ADLs.    Baseline Reports nearly 100%    Time 6    Period Weeks    Status Achieved      PT LONG TERM GOAL #3   Title Patient will have equal to or > 4+/5 MMT throughout BLEs to improve ability to perform functional mobility, stair ambulation and ADLs.    Baseline See MMT    Time 6    Period Weeks    Status Partially Met      PT LONG TERM GOAL #4   Title Patient will improve FOTO score by 10% to indicate improvement in functional outcomes    Baseline Current 10% improved    Time 6    Period Weeks    Status Achieved                 Plan - 06/30/19 1811    Clinical Impression Statement Patient is making good progress toward functional goals. Patient continues to be limited by mild AROM restriction and quad weakness, notably with eccentric control descending stairs. Patient with improved single limb stance time today. Patient also reporting less pain overall today. Patient will continue to benefit from skilled therapy services to progress knee strength and mobility to reduce pain and improve LOF with functional mobility.    Examination-Activity Limitations Bathing;Sit;Bed Mobility;Sleep;Bend;Squat;Caring for Others;Stairs;Carry;Stand;Toileting;Dressing;Transfers;Hygiene/Grooming;Lift;Locomotion Level    Examination-Participation Restrictions Laundry;Yard Work;Driving;Community Activity;Cleaning    Stability/Clinical Decision Making Stable/Uncomplicated    Rehab Potential Good    PT Frequency 2x / week    PT Duration 6 weeks    PT Treatment/Interventions ADLs/Self Care Home Management;Ultrasound;Neuromuscular re-education;Compression bandaging;Joint Manipulations;Passive range of motion;Scar mobilization;Parrafin;Fluidtherapy;Aquatic Therapy;Contrast Bath;Biofeedback;Patient/family education;DME Instruction;Gait training;Orthotic Fit/Training;Dry needling;Energy conservation;Splinting;Stair  training;Cryotherapy;Electrical Stimulation;Functional mobility training;Iontophoresis 11m/ml Dexamethasone;Therapeutic activities;Therapeutic exercise;Moist Heat;Traction;Balance training;Manual lymph drainage;Manual techniques;Vasopneumatic Device;Taping;Wheelchair mobility training    PT Next Visit Plan Progress hip and knee strength, and knee ROM as tolerated. Begin balance on compliant surface.    PT Home Exercise Plan 05/21/19: quad set, heel slide, ankle pump; 05/24/19: SLR, glute set; 05/31/19 - standing calf stretch    Consulted and Agree with Plan of Care Patient  Patient will benefit from skilled therapeutic intervention in order to improve the following deficits and impairments:  Abnormal gait, Decreased endurance, Hypomobility, Decreased scar mobility, Increased edema, Decreased activity tolerance, Decreased strength, Pain, Decreased balance, Decreased mobility, Difficulty walking, Decreased range of motion, Improper body mechanics, Impaired flexibility  Visit Diagnosis: Stiffness of right knee, not elsewhere classified  Muscle weakness (generalized)  Other abnormalities of gait and mobility     Problem List Patient Active Problem List   Diagnosis Date Noted  . S/P right TKA 05/18/2019  . OBESITY 05/10/2009  . ANXIETY 05/10/2009  . MIGRAINE HEADACHE 04/15/2009  . PNEUMONIA, COMMUNITY ACQUIRED, PNEUMOCOCCAL 04/15/2009  . ASTHMA 04/15/2009    6:16 PM, 06/30/19 Josue Hector PT DPT  Physical Therapist with [facility]  [phone] [place], [place], [address] Phone: [phone]   Fax:  [phone]  Name: Audrey Weeks MRN: [id number] Date of Birth: 05/21/1960

## 2019-07-06 ENCOUNTER — Telehealth (HOSPITAL_COMMUNITY): Payer: Self-pay | Admitting: Physical Therapy

## 2019-07-06 ENCOUNTER — Ambulatory Visit (HOSPITAL_COMMUNITY): Payer: Medicare Other | Admitting: Physical Therapy

## 2019-07-06 NOTE — Telephone Encounter (Signed)
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

## 2019-07-08 ENCOUNTER — Other Ambulatory Visit: Payer: Self-pay

## 2019-07-08 ENCOUNTER — Ambulatory Visit (HOSPITAL_COMMUNITY): Payer: Medicare Other | Attending: Orthopedic Surgery | Admitting: Physical Therapy

## 2019-07-08 DIAGNOSIS — M6281 Muscle weakness (generalized): Secondary | ICD-10-CM | POA: Insufficient documentation

## 2019-07-08 DIAGNOSIS — M25661 Stiffness of right knee, not elsewhere classified: Secondary | ICD-10-CM | POA: Insufficient documentation

## 2019-07-08 DIAGNOSIS — R2689 Other abnormalities of gait and mobility: Secondary | ICD-10-CM | POA: Insufficient documentation

## 2019-07-08 NOTE — Therapy (Addendum)
[facility] [address] [place], [place], [address] Phone: [phone]   Fax:  [phone]  Physical Therapy Treatment/ Discharge Summary  Patient Details  Name: Audrey Weeks MRN: [id number] Date of Birth: 1960/09/25 Referring Provider (PT): Danae Orleans PA-C   Encounter Date: 07/08/2019   PHYSICAL THERAPY DISCHARGE SUMMARY  Visits from Start of Care: 12  Current functional level related to goals / functional outcomes: See below   Remaining deficits: See below   Education / Equipment: See assessment  Plan: Patient agrees to discharge.  Patient goals were met. Patient is being discharged due to meeting the stated rehab goals.  ?????        PT End of Session - 07/08/19 1559    Visit Number 12    Number of Visits 18    Date for PT Re-Evaluation 07/23/19    Authorization Type UHC Medicare; Medicaid secondary    Progress Note Due on Visit 16    PT Start Time 1515    PT Stop Time 1545    PT Time Calculation (min) 30 min    Activity Tolerance Patient tolerated treatment well    Behavior During Therapy WFL for tasks assessed/performed           Past Medical History:  Diagnosis Date  . Anxiety   . Arthritis   . Asthma   . Migraines   . Pneumonia     Past Surgical History:  Procedure Laterality Date  . CESAREAN SECTION    . TOTAL KNEE ARTHROPLASTY Right 05/18/2019   Procedure: TOTAL KNEE ARTHROPLASTY;  Surgeon: Paralee Cancel, MD;  Location: [facility];  Service: Orthopedics;  Laterality: Right;  84mns    There were no vitals filed for this visit.       [facility] 07[facility] 1524      Assessment   Medical Diagnosis RT TKA     Referring Provider (PT) MDanae OrleansPA-C    Onset Date/Surgical Date 05/18/19    Next MD Visit 1 year    Prior Therapy yes, RT knee prior to surgery       Precautions   Precautions --      Restrictions   Weight Bearing Restrictions --      [facility] [place]      Prior Function   Level of Independence Independent      Cognition   Overall Cognitive Status Within Functional Limits for tasks assessed      Observation/Other Assessments   Focus on Therapeutic Outcomes (FOTO)  9% limited    was 38% limited      Sensation   Light Touch --      AROM   Right Knee Extension 0   was lacking 10   Right Knee Flexion 120   was 65     Strength   Right Hip Flexion 5/5   was 3-   Right Hip Extension 5/5   was 4/5   Right Hip ABduction 5/5   was 4/5   Left Hip Flexion 5/5   was 4   Left Hip Extension 5/5   was 4-/5   Left Hip ABduction 5/5   was 4/5   Right Knee Flexion 5/5   was 3+   Right Knee Extension 5/5   was 3-   Left Knee Flexion 5/5   was 4+/5   Left Knee Extension 5/5   was 4+/5     Ambulation/Gait  Ambulation/Gait Yes    Ambulation/Gait Assistance 7: Independent    Ambulation Distance (Feet) 400 Feet    Assistive device None    Gait Pattern Within Functional Limits    Stairs Yes    Stairs Assistance 7: Independent    Stair Management Technique One rail Right;Alternating pattern    Number of Stairs 12    Height of Stairs 7      Balance   Balance Assessed --      Static Standing Balance   Static Standing Balance -  Activities  --                         [facility] Adult PT Treatment/Exercise - 07/08/19 1524      Knee/Hip Exercises: Supine   Knee Extension AROM;Right    Knee Extension Limitations 0    Knee Flexion AROM;Right    Knee Flexion Limitations 120                    PT Short Term Goals - 07/08/19 1605      PT SHORT TERM GOAL #1   Title Patient will be independent with initial HEP and self-management strategies to improve functional outcomes    Baseline Reports compliance    Time 3    Period Weeks    Status Achieved    Target Date 06/11/19      PT SHORT TERM GOAL #2   Title Patient will have Rt knee AROM 5-100 degrees to improve functional  mobility and facilitate squatting to pick up items from floor.    Baseline Current : 3-117 degrees    Time 3    Period Weeks    Status Achieved    Target Date 06/11/19             PT Long Term Goals - 07/08/19 1605      PT LONG TERM GOAL #1   Title Patient will have RT knee AROM 0-120 degrees to improve functional mobility and facilitate squatting to pick up items from floor.    Baseline Current : 3-117 degrees    Time 6    Period Weeks    Status Achieved      PT LONG TERM GOAL #2   Title Patient will report at least 85% overall improvement in subjective complaint to indicate improvement in ability to perform ADLs.    Baseline Reports nearly 100%    Time 6    Period Weeks    Status Achieved      PT LONG TERM GOAL #3   Title Patient will have equal to or > 4+/5 MMT throughout BLEs to improve ability to perform functional mobility, stair ambulation and ADLs.    Baseline See MMT    Time 6    Period Weeks    Status Achieved      PT LONG TERM GOAL #4   Title Patient will improve FOTO score by 10% to indicate improvement in functional outcomes    Baseline Current 10% improved    Time 6    Period Weeks    Status Achieved                 Plan - 07/08/19 1603    Clinical Impression Statement Pt returns today stating she feels she can be done with therapy.  States her orthopedist discharged her and plans on returning to the gymn this week.  No visitble gait deviations or difficulties with stair  negotiation.  Pt has met all goals and ROM/strength is now WNL.    Examination-Activity Limitations Bathing;Sit;Bed Mobility;Sleep;Bend;Squat;Caring for Others;Stairs;Carry;Stand;Toileting;Dressing;Transfers;Hygiene/Grooming;Lift;Locomotion Level    Examination-Participation Restrictions Laundry;Yard Work;Driving;Community Activity;Cleaning    Stability/Clinical Decision Making Stable/Uncomplicated    Rehab Potential Good    PT Frequency 2x / week    PT Duration 6 weeks    PT  Treatment/Interventions ADLs/Self Care Home Management;Ultrasound;Neuromuscular re-education;Compression bandaging;Joint Manipulations;Passive range of motion;Scar mobilization;Parrafin;Fluidtherapy;Aquatic Therapy;Contrast Bath;Biofeedback;Patient/family education;DME Instruction;Gait training;Orthotic Fit/Training;Dry needling;Energy conservation;Splinting;Stair training;Cryotherapy;Electrical Stimulation;Functional mobility training;Iontophoresis '4mg'$ /ml Dexamethasone;Therapeutic activities;Therapeutic exercise;Moist Heat;Traction;Balance training;Manual lymph drainage;Manual techniques;Vasopneumatic Device;Taping;Wheelchair mobility training    PT Next Visit Plan Discharge to HEP.    PT Home Exercise Plan 05/21/19: quad set, heel slide, ankle pump; 05/24/19: SLR, glute set; 05/31/19 - standing calf stretch    Consulted and Agree with Plan of Care Patient           Patient will benefit from skilled therapeutic intervention in order to improve the following deficits and impairments:  Abnormal gait, Decreased endurance, Hypomobility, Decreased scar mobility, Increased edema, Decreased activity tolerance, Decreased strength, Pain, Decreased balance, Decreased mobility, Difficulty walking, Decreased range of motion, Improper body mechanics, Impaired flexibility  Visit Diagnosis: Stiffness of right knee, not elsewhere classified  Muscle weakness (generalized)  Other abnormalities of gait and mobility     Problem List Patient Active Problem List   Diagnosis Date Noted  . S/P right TKA 05/18/2019  . OBESITY 05/10/2009  . ANXIETY 05/10/2009  . MIGRAINE HEADACHE 04/15/2009  . PNEUMONIA, COMMUNITY ACQUIRED, PNEUMOCOCCAL 04/15/2009  . ASTHMA 04/15/2009   Teena Irani, PTA/CLT [phone]  Roseanne Reno B 07/08/2019, 4:09 PM   5:16 PM, 07/08/19 Josue Hector PT DPT  Physical Therapist with [facility]  [phone]    [facility] [address] [place], [place], [address] Phone: [phone]   Fax:  [phone]  Name: Audrey Weeks MRN: [id number] Date of Birth: 28-Sep-1960

## 2020-07-17 ENCOUNTER — Emergency Department (HOSPITAL_COMMUNITY)
Admission: EM | Admit: 2020-07-17 | Discharge: 2020-07-17 | Disposition: A | Discharge status: Home / Self Care | Payer: Medicare Other | Attending: Emergency Medicine | Admitting: Emergency Medicine

## 2020-07-17 ENCOUNTER — Encounter (HOSPITAL_COMMUNITY): Payer: Self-pay | Admitting: Emergency Medicine

## 2020-07-17 ENCOUNTER — Other Ambulatory Visit: Payer: Self-pay

## 2020-07-17 ENCOUNTER — Emergency Department (HOSPITAL_COMMUNITY): Payer: Medicare Other

## 2020-07-17 DIAGNOSIS — M7989 Other specified soft tissue disorders: Secondary | ICD-10-CM | POA: Insufficient documentation

## 2020-07-17 DIAGNOSIS — R059 Cough, unspecified: Secondary | ICD-10-CM | POA: Diagnosis present

## 2020-07-17 DIAGNOSIS — Z79899 Other long term (current) drug therapy: Secondary | ICD-10-CM | POA: Diagnosis not present

## 2020-07-17 DIAGNOSIS — R5381 Other malaise: Secondary | ICD-10-CM | POA: Insufficient documentation

## 2020-07-17 DIAGNOSIS — R0981 Nasal congestion: Secondary | ICD-10-CM | POA: Insufficient documentation

## 2020-07-17 DIAGNOSIS — J069 Acute upper respiratory infection, unspecified: Secondary | ICD-10-CM

## 2020-07-17 DIAGNOSIS — Z20822 Contact with and (suspected) exposure to covid-19: Secondary | ICD-10-CM | POA: Insufficient documentation

## 2020-07-17 LAB — RESP PANEL BY RT-PCR (FLU A&B, COVID) ARPGX2
Influenza A by PCR: NEGATIVE
Influenza B by PCR: NEGATIVE
SARS Coronavirus 2 by RT PCR: NEGATIVE

## 2020-07-17 MED ORDER — PREDNISONE 20 MG PO TABS: 40.0000 mg | ORAL_TABLET | Freq: Every day | ORAL | 0 refills | Status: DC

## 2020-07-17 MED ORDER — BENZONATATE 200 MG PO CAPS
200.0000 mg | ORAL_CAPSULE | Freq: Three times a day (TID) | ORAL | 0 refills | Status: DC | PRN
Start: 2020-07-17 — End: 2021-03-13

## 2020-07-17 MED ORDER — BENZONATATE 100 MG PO CAPS
200.0000 mg | ORAL_CAPSULE | Freq: Once | ORAL | Status: AC
  Administered 2020-07-17: 200 mg via ORAL
  Filled 2020-07-17: qty 2

## 2020-07-17 MED ORDER — ALBUTEROL SULFATE HFA 108 (90 BASE) MCG/ACT IN AERS
2.0000 | INHALATION_SPRAY | Freq: Once | RESPIRATORY_TRACT | Status: AC
  Administered 2020-07-17: 2 via RESPIRATORY_TRACT
  Filled 2020-07-17: qty 6.7

## 2020-07-17 MED ORDER — HYDROCOD POLST-CPM POLST ER 10-8 MG/5ML PO SUER: 5.0000 mL | Freq: Once | ORAL | Status: DC

## 2020-07-17 MED ORDER — ACETAMINOPHEN 325 MG PO TABS
650.0000 mg | ORAL_TABLET | Freq: Once | ORAL | Status: AC
  Administered 2020-07-17: 650 mg via ORAL
  Filled 2020-07-17: qty 2

## 2020-07-17 NOTE — ED Triage Notes (Signed)
Pt c/o cold sx/cough/congestion/body aches/chest pain and feeling dehydrated x a few days. Pt states she took a Covid test last night that was negative.

## 2020-07-17 NOTE — ED Provider Notes (Signed)
[facility] EMERGENCY DEPARTMENT Provider Note   CSN: [id number] Arrival date & time: 07/17/20  1120     History Chief Complaint  Patient presents with   Cough    Audrey Weeks is a 60 y.o. female.   Cough Associated symptoms: headaches, myalgias and rhinorrhea   Associated symptoms: no chest pain, no chills, no fever, no rash, no shortness of breath, no sore throat and no wheezing        Audrey Weeks is a 60 y.o. female medical history of asthma who presents to the Emergency Department complaining of generalized body aches, headache, cough, nasal congestion and rhinorrhea, and feels dehydrated although she has been drinking fluids.  Symptoms began gradually early last week, and began after her [facility] went out and she had to open the windows in her home for several days and she believes the pollen has aggravated her sinuses  States her husband has similar symptoms.  She took a home COVID test last evening that was negative.  She has been vaccinated and boosted, but concerned that she has Covid.  States her cough is occasionally productive of sputum.  Denies sore throat, loss of taste or smell, shortness of breath, chest pain, abdominal pain, diarrhea, neck pain or stiffness.  Nothing makes her symptoms better or worse.  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     Family History  Problem Relation Age of Onset   Hypertension Other    Diabetes Other     Social History   Tobacco Use   Smoking status: Never   Smokeless tobacco: Never  Vaping Use   Vaping Use: Never used  Substance Use Topics   Alcohol use: Not Currently    Comment: occ   Drug use: No    Home Medications Prior to Admission medications   Medication Sig Start Date End Date Taking? Authorizing Provider  acetaminophen (TYLENOL) 500 MG tablet Take 2 tablets (1,000 mg total) by mouth every 8 (eight) hours. 05/19/19   Danae Orleans, PA-C  ALPRAZolam Duanne Moron) 0.25 MG tablet Take 0.25 mg by mouth 2 (two) times daily as needed for anxiety.  07/24/17   [provider]  celecoxib (CELEBREX) 200 MG capsule Take 1 capsule (200 mg total) by mouth 2 (two) times daily. 05/19/19   Danae Orleans, PA-C  docusate sodium (COLACE) 100 MG capsule Take 1 capsule (100 mg total) by mouth 2 (two) times daily. 05/19/19   Danae Orleans, PA-C  ferrous sulfate (FERROUSUL) 325 (65 FE) MG tablet Take 1 tablet (325 mg total) by mouth 3 (three) times daily with meals for 14 days. 05/19/19 06/02/19  Danae Orleans, PA-C  gabapentin (NEURONTIN) 600 MG tablet Take 600 mg by mouth daily.  07/25/17   [provider]  hydrOXYzine (ATARAX/VISTARIL) 25 MG tablet Take 1 tablet by mouth 3 (three) times daily. 06/23/17   [provider]  methocarbamol (ROBAXIN) 500 MG tablet Take 1 tablet (500 mg total) by mouth every 6 (six) hours as needed for muscle spasms. 05/19/19   Danae Orleans, PA-C  OVER THE COUNTER MEDICATION Take 750 mg by mouth daily. [place]    [provider]  oxyCODONE (OXY IR/ROXICODONE) 5 MG immediate release tablet Take 1-2 tablets (5-10 mg total) by mouth every 4 (four) hours as needed for moderate pain or severe pain. 05/19/19   Danae Orleans, PA-C  phentermine 37.5 MG capsule Take 37.5 mg by mouth every morning.    [provider]  polyethylene glycol (MIRALAX / GLYCOLAX) 17 g packet Take 17 g by mouth 2 (two) times daily. 05/19/19   Danae Orleans, PA-C  Semaglutide, 1 MG/DOSE, (OZEMPIC, 1 MG/DOSE,) 2 MG/1.5ML SOPN Inject 1 mg into the skin every Friday.    [provider]  topiramate (TOPAMAX) 50 MG tablet Take 50 mg by mouth at bedtime as needed (headaches).  07/25/17   [provider]  albuterol (PROVENTIL HFA;VENTOLIN HFA) 108 (90 Base) MCG/ACT inhaler Inhale 2 puffs into the lungs every 4 (four) hours as needed for wheezing or shortness of breath. 10/31/16 10/27/18  Joy, Helane Gunther, PA-C    Allergies    Patient has no known allergies.  Review of Systems   Review of Systems  Constitutional:  Negative for chills, fatigue and fever.  HENT:  Positive for congestion and rhinorrhea. Negative for sore throat and trouble swallowing.   Respiratory:  Positive for cough. Negative for shortness of breath and wheezing.   Cardiovascular:  Negative for chest pain and palpitations.  Gastrointestinal:  Negative for abdominal pain, diarrhea, nausea and vomiting.  Genitourinary:  Negative for dysuria and flank pain.  Musculoskeletal:  Positive for myalgias. Negative for arthralgias, back pain, neck pain and neck stiffness.  Skin:  Negative for rash.  Neurological:  Positive for headaches. Negative for dizziness, weakness and numbness.  Hematological:  Does not bruise/bleed easily.   Physical Exam Updated Vital Signs BP 126/86 (BP Location: Right Arm)   Pulse (!) 104   Temp 99.1 F (37.3 C) (Oral)   Resp 20   Ht 5\' 2"  (1.575 m)   Wt 94.8 kg   LMP 12/08/2011   SpO2 99%   BMI 38.23 kg/m   Physical Exam Vitals and nursing note reviewed.  Constitutional:      Appearance: Normal appearance. She is not ill-appearing or toxic-appearing.  HENT:     Head: Normocephalic.     Nose: No rhinorrhea.     Mouth/Throat:     Mouth: Mucous membranes are moist.  Eyes:     Conjunctiva/sclera:  Conjunctivae normal.  Cardiovascular:     Rate and Rhythm: Normal rate and regular rhythm.     Pulses: Normal pulses.  Pulmonary:     Effort: Pulmonary effort is normal. No respiratory distress.     Breath sounds: Normal breath sounds. No wheezing or rhonchi.     Comments: Patient actively coughing, no increased work of breathing.  No wheezing or rales. Abdominal:     Palpations: Abdomen is soft.     Tenderness: There is no abdominal tenderness. There is no guarding or rebound.  Musculoskeletal:        General: Normal range of motion.     Cervical back: Full passive range of motion without  pain and normal range of motion.     Right lower leg: No edema.     Left lower leg: No edema.  Skin:    General: Skin is warm.     Capillary Refill: Capillary refill takes less than 2 seconds.     Findings: No rash.  Neurological:     General: No focal deficit present.     Mental Status: She is alert.     Sensory: No sensory deficit.     Motor: No weakness.    ED Results / Procedures / Treatments   Labs (all labs ordered are listed, but only abnormal results are displayed) Labs Reviewed  RESP PANEL BY RT-PCR (FLU A&B, COVID) [id number]    EKG EKG Interpretation  Date/Time:  Monday July 17 2020 11:40:02 EDT Ventricular Rate:  111 PR Interval:  172 QRS Duration: 64 QT Interval:  312 QTC Calculation: 424 R Axis:   56 Text Interpretation: Sinus tachycardia Otherwise normal ECG Confirmed by Nanda Quinton [phone]) on 07/17/2020 3:30:25 PM  Radiology DG Chest Port 1 View  Result Date: 07/17/2020 CLINICAL DATA:  Headache and body aches.  Chest pain and cough. EXAM: PORTABLE CHEST 1 VIEW COMPARISON:  10/31/2016 FINDINGS: The heart size and mediastinal contours are within normal limits. Aortic atherosclerotic calcifications. Both lungs are clear. The visualized skeletal structures are unremarkable. IMPRESSION: No active disease. Electronically Signed   By: Kerby Moors M.D.   On: 07/17/2020 12:24     Procedures Procedures   Medications Ordered in ED Medications  albuterol (VENTOLIN HFA) 108 (90 Base) MCG/ACT inhaler 2 puff (2 puffs Inhalation Given 07/17/20 1326)  benzonatate (TESSALON) capsule 200 mg (200 mg Oral Given 07/17/20 1324)    ED Course  I have reviewed the triage vital signs and the nursing notes.  Pertinent labs & imaging results that were available during my care of the patient were reviewed by me and considered in my medical decision making (see chart for details).    MDM Rules/Calculators/A&P                          Patient here with symptoms of cough, body aches, nasal congestion and malaise.  Symptoms present for several days, worse since last evening.  Concerning for COVID infection.  Patient states that she has been vaccinated x2 and 1 booster.  Had COVID infection months ago, no known recent exposures. On exam, patient nontoxic-appearing.  Low-grade fever and mildly tachycardic.  No hypoxia or tachypnea.   Chest x-ray without active disease.  Will obtain PCR COVID test.  COVID and influenza testing are negative.  Given absence of fever and reassuring chest x-ray, clinical suspicion for pneumonia is low. She denies chest pain or dyspnea to suggest PE  EKG w/o acute ischemic changes. Vitals rechecked after episode of coughing.  Pt care plan discussed with Dr. Laverta Baltimore.    Pt is non toxic appearing, ambulated in the dept w/o difficulty or decline in O2 saturation.  Pt appears appropriate for d/c home,albuterol MDI dispensed for home use, scripts written for tessalon, steroid.  Strict return precautions given and pt verbalized understanding to return here if sx's worsen.  She has PCP appt for this week   Final Clinical Impression(s) / ED Diagnoses Final diagnoses:  Cough    Rx / DC Orders ED Discharge Orders     None        Kem Parkinson, PA-C 07/19/20 1438    Long, Wonda Olds,  MD 07/24/20 2211

## 2020-07-17 NOTE — ED Notes (Signed)
Ambulated with pulse ox 97-99% on ra

## 2020-07-17 NOTE — Discharge Instructions (Addendum)
Your COVID, influenza testing today were negative.  Your chest x-ray did not show evidence of pneumonia.  You likely have a viral infection.  Continue using the albuterol inhaler, 1 to 2 puffs every 4-6 hours.  Take Tylenol every 4-6 hours if needed for body aches and/or fever.  Drink plenty of fluids.  Take the prescription medication as directed.  Follow-up with your primary care provider this week for recheck.  If you develop fever or your symptoms are not improving after several days, you may want to have your COVID test repeated.  Return to the emergency department for any new or worsening symptoms.

## 2020-10-04 ENCOUNTER — Other Ambulatory Visit: Payer: Self-pay

## 2020-10-04 ENCOUNTER — Emergency Department (HOSPITAL_COMMUNITY): Payer: Medicare Other

## 2020-10-04 ENCOUNTER — Encounter (HOSPITAL_COMMUNITY): Payer: Self-pay

## 2020-10-04 ENCOUNTER — Emergency Department (HOSPITAL_COMMUNITY)
Admission: EM | Admit: 2020-10-04 | Discharge: 2020-10-04 | Disposition: A | Discharge status: Home / Self Care | Payer: Medicare Other | Attending: Emergency Medicine | Admitting: Emergency Medicine

## 2020-10-04 DIAGNOSIS — J45909 Unspecified asthma, uncomplicated: Secondary | ICD-10-CM | POA: Diagnosis not present

## 2020-10-04 DIAGNOSIS — Z96651 Presence of right artificial knee joint: Secondary | ICD-10-CM | POA: Insufficient documentation

## 2020-10-04 DIAGNOSIS — M549 Dorsalgia, unspecified: Secondary | ICD-10-CM | POA: Insufficient documentation

## 2020-10-04 DIAGNOSIS — M7918 Myalgia, other site: Secondary | ICD-10-CM

## 2020-10-04 DIAGNOSIS — S199XXA Unspecified injury of neck, initial encounter: Secondary | ICD-10-CM | POA: Diagnosis present

## 2020-10-04 DIAGNOSIS — Y92410 Unspecified street and highway as the place of occurrence of the external cause: Secondary | ICD-10-CM | POA: Insufficient documentation

## 2020-10-04 DIAGNOSIS — S161XXA Strain of muscle, fascia and tendon at neck level, initial encounter: Secondary | ICD-10-CM | POA: Insufficient documentation

## 2020-10-04 DIAGNOSIS — R519 Headache, unspecified: Secondary | ICD-10-CM | POA: Diagnosis not present

## 2020-10-04 MED ORDER — CYCLOBENZAPRINE HCL 10 MG PO TABS
10.0000 mg | ORAL_TABLET | Freq: Two times a day (BID) | ORAL | 0 refills | Status: DC | PRN
Start: 2020-10-04 — End: 2020-10-04

## 2020-10-04 MED ORDER — METHOCARBAMOL 500 MG PO TABS
500.0000 mg | ORAL_TABLET | Freq: Once | ORAL | Status: AC
  Administered 2020-10-04: 500 mg via ORAL
  Filled 2020-10-04: qty 1

## 2020-10-04 MED ORDER — NAPROXEN 500 MG PO TABS
500.0000 mg | ORAL_TABLET | Freq: Two times a day (BID) | ORAL | 0 refills | Status: DC
Start: 2020-10-04 — End: 2021-04-03

## 2020-10-04 MED ORDER — METOCLOPRAMIDE HCL 5 MG/ML IJ SOLN
10.0000 mg | Freq: Once | INTRAMUSCULAR | Status: AC
  Administered 2020-10-04: 10 mg via INTRAVENOUS
  Filled 2020-10-04: qty 2

## 2020-10-04 MED ORDER — NAPROXEN 500 MG PO TABS
500.0000 mg | ORAL_TABLET | Freq: Two times a day (BID) | ORAL | 0 refills | Status: DC
Start: 2020-10-04 — End: 2020-10-04

## 2020-10-04 MED ORDER — KETOROLAC TROMETHAMINE 30 MG/ML IJ SOLN
15.0000 mg | Freq: Once | INTRAMUSCULAR | Status: AC
  Administered 2020-10-04: 15 mg via INTRAVENOUS
  Filled 2020-10-04: qty 1

## 2020-10-04 MED ORDER — CYCLOBENZAPRINE HCL 10 MG PO TABS
10.0000 mg | ORAL_TABLET | Freq: Two times a day (BID) | ORAL | 0 refills | Status: DC | PRN
Start: 2020-10-04 — End: 2021-03-13
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
## 2021-01-15 ENCOUNTER — Other Ambulatory Visit: Payer: Self-pay

## 2021-01-15 DIAGNOSIS — R87612 Low grade squamous intraepithelial lesion on cytologic smear of cervix (LGSIL): Secondary | ICD-10-CM

## 2021-01-31 ENCOUNTER — Ambulatory Visit: Payer: Self-pay | Admitting: Obstetrics and Gynecology

## 2021-02-05 ENCOUNTER — Ambulatory Visit: Payer: Medicare Other | Admitting: Obstetrics and Gynecology

## 2021-02-28 ENCOUNTER — Other Ambulatory Visit: Payer: Self-pay | Admitting: *Deleted

## 2021-02-28 DIAGNOSIS — R87612 Low grade squamous intraepithelial lesion on cytologic smear of cervix (LGSIL): Secondary | ICD-10-CM

## 2021-03-05 NOTE — Progress Notes (Deleted)
GYNECOLOGY  VISIT ?  ?HPI: ?61 y.o.   Married Black or [place] American Not Hispanic or Latino  female   ?No obstetric history on file. with Patient's last menstrual period was 12/08/2011.   ?here for  Colposcopy  ? ?GYNECOLOGIC HISTORY: ?Patient's last menstrual period was 12/08/2011. ?Contraception:*** ?Menopausal hormone therapy: *** ?       ?OB History   ?No obstetric history on file. ?  ?    ? ?Patient Active Problem List  ? Diagnosis Date Noted  ? 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 OBESITY 05/10/2009  ? ANXIETY 05/10/2009  ? MIGRAINE HEADACHE 04/15/2009  ? PNEUMONIA, COMMUNITY ACQUIRED, PNEUMOCOCCAL 04/15/2009  ? ASTHMA 04/15/2009  ? ? ?Past Medical History:  ?Diagnosis Date  ? Anxiety   ? Arthritis   ? Asthma   ? Migraines   ? Pneumonia   ? ? ?Past Surgical History:  ?Procedure Laterality Date  ? CESAREAN SECTION    ? TOTAL KNEE ARTHROPLASTY Right 05/18/2019  ? Procedure: TOTAL KNEE ARTHROPLASTY;  Surgeon: Paralee Cancel, MD;  Location: [facility];  Service: Orthopedics;  Laterality: Right;  100mins  ? ? ?Current Outpatient Medications  ?Medication Sig Dispense Refill  ? acetaminophen (TYLENOL) 500 MG tablet Take 2 tablets (1,000 mg total) by mouth every 8 (eight) hours. 30 tablet 0  ? ALPRAZolam (XANAX) 0.25 MG tablet Take 0.25 mg by mouth 2 (two) times daily as needed for anxiety.   0  ? benzonatate (TESSALON) 200 MG capsule Take 1 capsule (200 mg total) by mouth 3 (three) times daily as needed for cough. Swallow whole, do not chew 21 capsule 0  ? cyclobenzaprine (FLEXERIL) 10 MG tablet Take 1 tablet (10 mg total) by mouth 2 (two) times daily as needed for muscle spasms. 20 tablet 0  ? docusate sodium (COLACE) 100 MG capsule Take 1 capsule (100 mg total) by mouth 2 (two) times daily. 28 capsule 0  ? ferrous sulfate (FERROUSUL) 325 (65 FE) MG tablet Take 1 tablet (325 mg total) by mouth 3 (three) times daily with meals for 14 days. 42 tablet 0  ? gabapentin (NEURONTIN) 600 MG tablet Take 600 mg by mouth daily.  2  ?  hydrOXYzine (ATARAX/VISTARIL) 25 MG tablet Take 1 tablet by mouth 3 (three) times daily.  0  ? naproxen (NAPROSYN) 500 MG tablet Take 1 tablet (500 mg total) by mouth 2 (two) times daily. 14 tablet 0  ? OVER THE COUNTER MEDICATION Take 750 mg by mouth daily. [place]    ? oxyCODONE (OXY IR/ROXICODONE) 5 MG immediate release tablet Take 1-2 tablets (5-10 mg total) by mouth every 4 (four) hours as needed for moderate pain or severe pain. 60 tablet 0  ? phentermine 37.5 MG capsule Take 37.5 mg by mouth every morning.    ? polyethylene glycol (MIRALAX / GLYCOLAX) 17 g packet Take 17 g by mouth 2 (two) times daily. 28 packet 0  ? predniSONE (DELTASONE) 20 MG tablet Take 2 tablets (40 mg total) by mouth daily. 10 tablet 0  ? Semaglutide, 1 MG/DOSE, (OZEMPIC, 1 MG/DOSE,) 2 MG/1.5ML SOPN Inject 1 mg into the skin every Friday.    ? topiramate (TOPAMAX) 50 MG tablet Take 50 mg by mouth at bedtime as needed (headaches).   0  ? ?No current facility-administered medications for this visit.  ?  ? ?ALLERGIES: Patient has no known allergies. ? ?Family History  ?Problem Relation Age of Onset  ? Hypertension Other   ? Diabetes Other   ? ? ?  Social History  ? ?Socioeconomic History  ? Marital status: Married  ?  Spouse name: Not on file  ? Number of children: Not on file  ? Years of education: Not on file  ? Highest education level: Not on file  ?Occupational History  ? Not on file  ?Tobacco Use  ? Smoking status: Never  ? Smokeless tobacco: Never  ?Vaping Use  ? Vaping Use: Never used  ?Substance and Sexual Activity  ? Alcohol use: Not Currently  ?  Comment: occ  ? Drug use: No  ? Sexual activity: Yes  ?Other Topics Concern  ? Not on file  ?Social History Narrative  ? Not on file  ? ?Social Determinants of Health  ? ?Financial Resource Strain: Not on file  ?Food Insecurity: Not on file  ?Transportation Needs: Not on file  ?Physical Activity: Not on file  ?Stress: Not on file  ?Social Connections: Not on file  ?Intimate Partner  Violence: Not on file  ? ? ?ROS ? ?PHYSICAL EXAMINATION:   ? ?LMP 12/08/2011     ?General appearance: alert, cooperative and appears stated age ?Neck: no adenopathy, supple, symmetrical, trachea midline and thyroid {CHL AMB PHY EX THYROID NORM DEFAULT:[phone]::"normal to inspection and palpation"} ?Breasts: {Exam; breast:13139::"normal appearance, no masses or tenderness"} ?Abdomen: soft, non-tender; non distended, no masses,  no organomegaly ? ?Pelvic: External genitalia:  no lesions ?             Urethra:  normal appearing urethra with no masses, tenderness or lesions ?             Bartholins and Skenes: normal    ?             Vagina: normal appearing vagina with normal color and discharge, no lesions ?             Cervix: {CHL AMB PHY EX CERVIX NORM DEFAULT:[phone]::"no lesions"} ?             Bimanual Exam:  Uterus:  {CHL AMB PHY EX UTERUS NORM DEFAULT:[phone]::"normal size, contour, position, consistency, mobility, non-tender"} ?             Adnexa: {CHL AMB PHY EX ADNEXA NO MASS [phone]::"no mass, fullness, tenderness"} ?             Rectovaginal: {yes no:[id number]}.  Confirms. ?             Anus:  normal sphincter tone, no lesions ? ?Chaperone was present for exam. ? ?ASSESSMENT ? ?  ? ?PLAN ? ?  ?An After Visit Summary was printed and given to the patient. ? ?*** minutes face to face time of which over 50% was spent in counseling.  ? ?  ?

## 2021-03-07 ENCOUNTER — Ambulatory Visit: Payer: Medicare Other | Admitting: Obstetrics and Gynecology

## 2021-03-09 ENCOUNTER — Ambulatory Visit: Payer: Medicare Other | Admitting: Obstetrics and Gynecology

## 2021-03-09 DIAGNOSIS — Z0289 Encounter for other administrative examinations: Secondary | ICD-10-CM

## 2021-03-13 ENCOUNTER — Other Ambulatory Visit: Payer: Self-pay

## 2021-03-13 ENCOUNTER — Ambulatory Visit (INDEPENDENT_AMBULATORY_CARE_PROVIDER_SITE_OTHER): Payer: Medicare Other | Admitting: Obstetrics and Gynecology

## 2021-03-13 DIAGNOSIS — F32A Depression, unspecified: Secondary | ICD-10-CM | POA: Insufficient documentation

## 2021-03-13 DIAGNOSIS — R87612 Low grade squamous intraepithelial lesion on cytologic smear of cervix (LGSIL): Secondary | ICD-10-CM

## 2021-03-13 NOTE — Progress Notes (Signed)
GYNECOLOGY  VISIT ?  ?HPI: ?61 y.o.   Legally Separated Black or African American Not Hispanic or Latino  female   ?No obstetric history on file. with Patient's last menstrual period was 12/08/2011.   ?here for evaluation of a LSIL pap with +HPV   ? ?GYNECOLOGIC HISTORY: ?Patient's last menstrual period was 12/08/2011. ?Contraception:PMP  ?Menopausal hormone therapy: none ?       ?OB History   ?No obstetric history on file. ?  ?    ? ?Patient Active Problem List  ? Diagnosis Date Noted  ? Depression 03/13/2021  ? 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 Arthritis 10/23/2016  ? OBESITY 05/10/2009  ? ANXIETY 05/10/2009  ? MIGRAINE HEADACHE 04/15/2009  ? PNEUMONIA, COMMUNITY ACQUIRED, PNEUMOCOCCAL 04/15/2009  ? ASTHMA 04/15/2009  ? ? ?Past Medical History:  ?Diagnosis Date  ? Anxiety   ? Arthritis   ? Asthma   ? Migraines   ? Pneumonia   ? ? ?Past Surgical History:  ?Procedure Laterality Date  ? CESAREAN SECTION    ? TOTAL KNEE ARTHROPLASTY Right 05/18/2019  ? Procedure: TOTAL KNEE ARTHROPLASTY;  Surgeon: Paralee Cancel, MD;  Location: [facility];  Service: Orthopedics;  Laterality: Right;  62mns  ? ? ?Current Outpatient Medications  ?Medication Sig Dispense Refill  ? acetaminophen (TYLENOL) 500 MG tablet Take 2 tablets (1,000 mg total) by mouth every 8 (eight) hours. 30 tablet 0  ? ALPRAZolam (XANAX) 0.25 MG tablet Take 0.25 mg by mouth 2 (two) times daily as needed for anxiety.   0  ? naproxen (NAPROSYN) 500 MG tablet Take 1 tablet (500 mg total) by mouth 2 (two) times daily. 14 tablet 0  ? OVER THE COUNTER MEDICATION Take 750 mg by mouth daily. [place]    ? oxyCODONE (OXY IR/ROXICODONE) 5 MG immediate release tablet Take 1-2 tablets (5-10 mg total) by mouth every 4 (four) hours as needed for moderate pain or severe pain. 60 tablet 0  ? phentermine 37.5 MG capsule Take 37.5 mg by mouth every morning.    ? polyethylene glycol (MIRALAX / GLYCOLAX) 17 g packet Take 17 g by mouth 2 (two) times daily. 28 packet 0  ? Semaglutide, 1  MG/DOSE, (OZEMPIC, 1 MG/DOSE,) 2 MG/1.5ML SOPN Inject 1 mg into the skin every Friday.    ? topiramate (TOPAMAX) 50 MG tablet Take 50 mg by mouth at bedtime as needed (headaches).   0  ? BELSOMRA 20 MG TABS Take 1 tablet by mouth at bedtime.    ? ferrous sulfate (FERROUSUL) 325 (65 FE) MG tablet Take 1 tablet (325 mg total) by mouth 3 (three) times daily with meals for 14 days. 42 tablet 0  ? oxyCODONE-acetaminophen (PERCOCET) 10-325 MG tablet Take 1 tablet by mouth 3 (three) times daily as needed.    ? phentermine (ADIPEX-P) 37.5 MG tablet Take 37.5 mg by mouth daily.    ? pravastatin (PRAVACHOL) 40 MG tablet SMARTSIG:1 Tablet(s) By Mouth Every Evening    ? QUEtiapine (SEROQUEL) 100 MG tablet Take 100 mg by mouth at bedtime.    ? sertraline (ZOLOFT) 50 MG tablet Take 50 mg by mouth daily.    ? topiramate (TOPAMAX) 100 MG tablet SMARTSIG:1 Tablet(s) By Mouth Every Evening    ? ?No current facility-administered medications for this visit.  ?  ? ?ALLERGIES: Patient has no known allergies. ? ?Family History  ?Problem Relation Age of Onset  ? Hypertension Other   ? Diabetes Other   ? ? ?Social History  ? ?  Socioeconomic History  ? Marital status: Legally Separated  ?  Spouse name: Not on file  ? Number of children: Not on file  ? Years of education: Not on file  ? Highest education level: Not on file  ?Occupational History  ? Not on file  ?Tobacco Use  ? Smoking status: Never  ? Smokeless tobacco: Never  ?Vaping Use  ? Vaping Use: Never used  ?Substance and Sexual Activity  ? Alcohol use: Not Currently  ?  Comment: occ  ? Drug use: No  ? Sexual activity: Yes  ?Other Topics Concern  ? Not on file  ?Social History Narrative  ? Not on file  ? ?Social Determinants of Health  ? ?Financial Resource Strain: Not on file  ?Food Insecurity: Not on file  ?Transportation Needs: Not on file  ?Physical Activity: Not on file  ?Stress: Not on file  ?Social Connections: Not on file  ?Intimate Partner Violence: Not on file   ? ? ?ROS ? ?PHYSICAL EXAMINATION:   ? ?BP 122/70 (BP Location: Right Arm, Patient Position: Sitting, Cuff Size: Large)   Pulse 67   Ht '5\' 2"'$  (1.575 m)   Wt 201 lb 3.2 oz (91.3 kg)   LMP 12/08/2011   SpO2 99%   BMI 36.80 kg/m?     ?General appearance: alert, cooperative and appears stated age ? ?The power went out just after the patient was roomed. It was estimated by the power company that it would take hours for the power to come back on. She will reschedule.  ?

## 2021-04-03 ENCOUNTER — Other Ambulatory Visit: Payer: Self-pay | Admitting: *Deleted

## 2021-04-03 ENCOUNTER — Other Ambulatory Visit: Payer: Self-pay

## 2021-04-03 ENCOUNTER — Other Ambulatory Visit (HOSPITAL_COMMUNITY)
Admission: RE | Admit: 2021-04-03 | Discharge: 2021-04-03 | Disposition: A | Discharge status: Home / Self Care | Payer: Medicare Other | Source: Ambulatory Visit | Attending: Obstetrics and Gynecology | Admitting: Obstetrics and Gynecology

## 2021-04-03 ENCOUNTER — Ambulatory Visit (INDEPENDENT_AMBULATORY_CARE_PROVIDER_SITE_OTHER): Payer: Medicare Other | Admitting: Obstetrics and Gynecology

## 2021-04-03 ENCOUNTER — Encounter: Payer: Self-pay | Admitting: Obstetrics and Gynecology

## 2021-04-03 VITALS — BP 122/73 | HR 78 | Ht 62.5 in | Wt 203.0 lb

## 2021-04-03 DIAGNOSIS — Z113 Encounter for screening for infections with a predominantly sexual mode of transmission: Secondary | ICD-10-CM

## 2021-04-03 DIAGNOSIS — R87612 Low grade squamous intraepithelial lesion on cytologic smear of cervix (LGSIL): Secondary | ICD-10-CM | POA: Insufficient documentation

## 2021-04-03 DIAGNOSIS — N882 Stricture and stenosis of cervix uteri: Secondary | ICD-10-CM

## 2021-04-03 DIAGNOSIS — B977 Papillomavirus as the cause of diseases classified elsewhere: Secondary | ICD-10-CM

## 2021-04-03 DIAGNOSIS — B3731 Acute candidiasis of vulva and vagina: Secondary | ICD-10-CM | POA: Diagnosis not present

## 2021-04-03 LAB — WET PREP FOR TRICH, YEAST, CLUE

## 2021-04-03 MED ORDER — FLUCONAZOLE 150 MG PO TABS: 150.0000 mg | ORAL_TABLET | Freq: Once | ORAL | 0 refills | Status: AC

## 2021-04-03 NOTE — Progress Notes (Signed)
GYNECOLOGY  VISIT ?  ?HPI: ?61 y.o.   Legally Separated Black or African American Not Hispanic or Latino  female   ?No obstetric history on file. with Patient's last menstrual period was 12/08/2011.   ?here for evaluation of a LSIL pap with +HPV. No prior h/o abnormal paps. She hasn't been sexually active for over a year. ? ?She reports intermittent vulvar itching ? ?GYNECOLOGIC HISTORY: ?Patient's last menstrual period was 12/08/2011. ?Contraception:pmp  ?Menopausal hormone therapy: none  ?       ?OB History   ?No obstetric history on file. ?  ?    ? ?Patient Active Problem List  ? Diagnosis Date Noted  ? Depression 03/13/2021  ? 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 Arthritis 10/23/2016  ? OBESITY 05/10/2009  ? ANXIETY 05/10/2009  ? MIGRAINE HEADACHE 04/15/2009  ? PNEUMONIA, COMMUNITY ACQUIRED, PNEUMOCOCCAL 04/15/2009  ? ASTHMA 04/15/2009  ? ? ?Past Medical History:  ?Diagnosis Date  ? Anxiety   ? Arthritis   ? Asthma   ? Migraines   ? Pneumonia   ? ? ?Past Surgical History:  ?Procedure Laterality Date  ? CESAREAN SECTION    ? TOTAL KNEE ARTHROPLASTY Right 05/18/2019  ? Procedure: TOTAL KNEE ARTHROPLASTY;  Surgeon: Paralee Cancel, MD;  Location: [facility];  Service: Orthopedics;  Laterality: Right;  36mns  ? ? ?Current Outpatient Medications  ?Medication Sig Dispense Refill  ? acetaminophen (TYLENOL) 500 MG tablet Take 2 tablets (1,000 mg total) by mouth every 8 (eight) hours. 30 tablet 0  ? ALPRAZolam (XANAX) 0.25 MG tablet Take 0.25 mg by mouth 2 (two) times daily as needed for anxiety.   0  ? BELSOMRA 20 MG TABS Take 1 tablet by mouth at bedtime.    ? ferrous sulfate (FERROUSUL) 325 (65 FE) MG tablet Take 1 tablet (325 mg total) by mouth 3 (three) times daily with meals for 14 days. 42 tablet 0  ? naproxen (NAPROSYN) 500 MG tablet Take 1 tablet (500 mg total) by mouth 2 (two) times daily. 14 tablet 0  ? OVER THE COUNTER MEDICATION Take 750 mg by mouth daily. [place]    ? oxyCODONE (OXY IR/ROXICODONE) 5 MG immediate release  tablet Take 1-2 tablets (5-10 mg total) by mouth every 4 (four) hours as needed for moderate pain or severe pain. 60 tablet 0  ? oxyCODONE-acetaminophen (PERCOCET) 10-325 MG tablet Take 1 tablet by mouth 3 (three) times daily as needed.    ? phentermine (ADIPEX-P) 37.5 MG tablet Take 37.5 mg by mouth daily.    ? phentermine 37.5 MG capsule Take 37.5 mg by mouth every morning.    ? polyethylene glycol (MIRALAX / GLYCOLAX) 17 g packet Take 17 g by mouth 2 (two) times daily. 28 packet 0  ? pravastatin (PRAVACHOL) 40 MG tablet SMARTSIG:1 Tablet(s) By Mouth Every Evening    ? QUEtiapine (SEROQUEL) 100 MG tablet Take 100 mg by mouth at bedtime.    ? Semaglutide, 1 MG/DOSE, (OZEMPIC, 1 MG/DOSE,) 2 MG/1.5ML SOPN Inject 1 mg into the skin every Friday.    ? sertraline (ZOLOFT) 50 MG tablet Take 50 mg by mouth daily.    ? topiramate (TOPAMAX) 100 MG tablet SMARTSIG:1 Tablet(s) By Mouth Every Evening    ? topiramate (TOPAMAX) 50 MG tablet Take 50 mg by mouth at bedtime as needed (headaches).   0  ? ?No current facility-administered medications for this visit.  ?  ? ?ALLERGIES: Patient has no known allergies. ? ?Family History  ?Problem Relation Age  of Onset  ? Hypertension Other   ? Diabetes Other   ? ? ?Social History  ? ?Socioeconomic History  ? Marital status: Legally Separated  ?  Spouse name: Not on file  ? Number of children: Not on file  ? Years of education: Not on file  ? Highest education level: Not on file  ?Occupational History  ? Not on file  ?Tobacco Use  ? Smoking status: Never  ? Smokeless tobacco: Never  ?Vaping Use  ? Vaping Use: Never used  ?Substance and Sexual Activity  ? Alcohol use: Not Currently  ?  Comment: occ  ? Drug use: No  ? Sexual activity: Yes  ?Other Topics Concern  ? Not on file  ?Social History Narrative  ? Not on file  ? ?Social Determinants of Health  ? ?Financial Resource Strain: Not on file  ?Food Insecurity: Not on file  ?Transportation Needs: Not on file  ?Physical Activity: Not on file   ?Stress: Not on file  ?Social Connections: Not on file  ?Intimate Partner Violence: Not on file  ? ? ?ROS ? ?PHYSICAL EXAMINATION:   ? ?LMP 12/08/2011     ?General appearance: alert, cooperative and appears stated age ?Pelvic: External genitalia:  no lesions ?             Urethra:  normal appearing urethra with no masses, tenderness or lesions ?             Bartholins and Skenes: normal    ?             Vagina: normal appearing vagina with a slight increase in thick, white vaginal d/c ?             Cervix: no lesions and stenotic ? ?Colposcopy: not satisfactory, no aceto-white changes. With application of lugols solution there are multiple small dots of decreased lugols uptake on the cervix (no TZ seen), the changes are seen to the end of the cervix . Cervical biopsy at 10 o'clock. ?Unable to do an ecc. Tenaculum placed and the cervix was dilated with the mini-dilators up to the #4 hagar dilator, then able to do an ECC. Hemostasis obtained with silver nitrate. Tenaculum removed. Speculum removed while applying lugols, no vaginal lesions noted.  ?              ?Chaperone was present for exam. ? ?1. Low grade squamous intraepithelial lesion on cytologic smear of cervix (LGSIL) ?Colposcopy with biopsy and ECC ?Discussed LSIL  ? ?2. HPV (human papilloma virus) infection ?Discussed HPV ? ?3. Cervical stenosis (uterine cervix) ?Needed cervical dilation in order to do the ECC ? ?4. Yeast vaginitis ?- WET PREP FOR TRICH, YEAST, CLUE ?- fluconazole (DIFLUCAN) 150 MG tablet; Take 1 tablet (150 mg total) by mouth once for 1 dose. Take one tablet.  Repeat in 72 hours if symptoms are not completely resolved.  Dispense: 2 tablet; Refill: 0 ? ?5. Screening examination for STD (sexually transmitted disease) ?- RPR ?- HIV Antibody (routine testing w rflx) ?- Hepatitis C antibody ?- SURESWAB CT/NG/T. vaginalis ? ? ?

## 2021-04-03 NOTE — Addendum Note (Signed)
Addended by: Dorothy Spark on: 04/03/2021 02:43 PM ? ? Modules accepted: Orders ? ?

## 2021-04-03 NOTE — Addendum Note (Signed)
Addended by: Carleene Mains on: 04/03/2021 12:42 PM ? ? Modules accepted: Orders ? ?

## 2021-04-03 NOTE — Patient Instructions (Signed)

## 2021-04-04 LAB — SURESWAB CT/NG/T. VAGINALIS
C. trachomatis RNA, TMA: NOT DETECTED
N. gonorrhoeae RNA, TMA: NOT DETECTED
Trichomonas vaginalis RNA: NOT DETECTED

## 2021-04-05 ENCOUNTER — Other Ambulatory Visit: Payer: Medicare Other

## 2021-04-05 ENCOUNTER — Other Ambulatory Visit: Payer: Medicare Other | Admitting: Obstetrics and Gynecology

## 2021-04-09 LAB — SURGICAL PATHOLOGY

## 2021-04-17 ENCOUNTER — Telehealth: Payer: Self-pay | Admitting: *Deleted

## 2021-04-17 NOTE — Telephone Encounter (Signed)
Burnice Logan, RN  ?04/17/2021  9:11 AM EDT Back to Top  ?  ?Left message to call Sharee Pimple, RN at [place], [phone], OPT OPT 5. ?  ?Telephone encounter created.   ? ?

## 2021-04-17 NOTE — Telephone Encounter (Signed)
-----   Message from Salvadore Dom, MD sent at 04/10/2021  9:29 AM EDT ----- ?Called patient and left a message for her to call back and speak with myself or Glorianne Manchester, RN. ?Her ECC shows LSIL, can't r/o HSIL. She had foci of dysplasia very lateral on her cervix, beyond the ability of a leep. She needs a leep in the OR with laser of very lateral cervical dysplasia.  ?I will put in the surgery request once the patient has been notified ?

## 2021-04-23 NOTE — Telephone Encounter (Signed)
Left message to call Sharee Pimple, RN at [place], [phone], OPT 5 regarding last visit with Dr. Talbert Nan.  ? ?

## 2021-04-27 NOTE — Telephone Encounter (Signed)
Burnice Logan, RN  ?04/27/2021  8:35 AM EDT Back to Top  ?  ?Letter and copy of results mailed to address on file.  ?See telephone encounter dated 04/17/21.   ? Salvadore Dom, MD  ?04/26/2021 12:34 PM EDT   ?  ?Please send the patient a letter stressing the importance of further evaluation/treatment of her abnormal ECC  ? ?

## 2021-05-03 NOTE — Telephone Encounter (Signed)
Hey Dr. Talbert Nan, Iam letting you know because Lawson Radar. Is not in office. But this pt returned call regarding her ECC/Bx results and your recommendations of her having procedure in OR vs. in office. She gave a call back number of [phone]. She stated we can call anytime. ? ?I called the patient and left another message for her to call back and speak to myself or Glorianne Manchester. I said we needed to make further plans. # left. She needs the below procedure. ? ?Surgery:  leep in the OR and laser of dysplasi , need colpo ? ?Diagnosis:  cervical dysplasia ? ?Location: [facility] ? ?Status: Outpatient ? ?Time: 30 Minutes ? ?Assistant: N/A ? ?Urgency: First Available ? ?Pre-Op Appointment: To Be Scheduled ? ?Post-Op Appointment(s): 4 Weeks,  ? ?Time Out Of Work: Day Of Surgery, 1 Day Post Op ? ?

## 2021-05-07 NOTE — Telephone Encounter (Signed)
Spoke with patient, advised per Dr. Talbert Nan. Patient will be traveling to [place], [place] 5/25-5/29, request to schedule procedure after. Reviewed surgery dates. Pre-op scheduled, will proceed with scheduling surgery for 06/11/21. Advised patient I will return call after date/time confirmed. Patient verbalizes understanding and is agreeable.  ? ?Surgery request sent.  ?

## 2021-05-08 NOTE — Telephone Encounter (Signed)
Left message to return call to [facility] at [phone], Prompt 3 for Benefits.  ?

## 2021-05-09 NOTE — Telephone Encounter (Signed)
Spoke with patient. Surgery date request confirmed.  ?Advised surgery is scheduled for 06/11/21, [place] at 32.  ?Surgery instruction sheet and hospital brochure reviewed, printed copy will be mailed.  ?Patient verbalizes understanding and is agreeable.  ?Routing to [facility] for benefits.  ? ?

## 2021-05-10 NOTE — Telephone Encounter (Signed)
Left message to return call to [facility] at [phone], Prompt 3 for Benefits.  ?

## 2021-05-15 ENCOUNTER — Encounter: Payer: Medicare Other | Admitting: Obstetrics and Gynecology

## 2021-05-15 DIAGNOSIS — Z0289 Encounter for other administrative examinations: Secondary | ICD-10-CM

## 2021-05-16 ENCOUNTER — Telehealth: Payer: Self-pay | Admitting: Obstetrics and Gynecology

## 2021-05-16 ENCOUNTER — Encounter: Payer: Self-pay | Admitting: Obstetrics and Gynecology

## 2021-05-16 NOTE — Telephone Encounter (Signed)
Left message to return call to [facility] at [phone], Prompt 3 for Benefits.  ?

## 2021-05-16 NOTE — Telephone Encounter (Signed)
Patient no show to her pre op appointment. Unable to contact her by telephone call would not go through. Mailed letter to patient to call and reschedule her appointment,.  ?

## 2021-05-17 NOTE — Telephone Encounter (Signed)
Call to patient at number previously provided.  ?Left message to call Sharee Pimple, RN at [place], [phone] to r/s pre-op appt to avoid cancelling surgery.  ? ?

## 2021-05-22 NOTE — Telephone Encounter (Addendum)
Spoke with patient.  ?Patient states she has concerns regarding upcoming procedure that she would like to discuss with Dr. Talbert Nan. Patient states she is considering cancelling her surgery. Recommended OV/Pre-op visit to further discuss. Patient states she has an upcoming trip that she has to prepare for and other appointments that she needs to get to along with caring for grandchild. Offered OV on 5/18 at 3pm, patient declined. Offered work in appt on 5/24, patient initially declined. Patient states she is another appt and had to go, request call later to reschedule. Called ended by patient.  ? ?

## 2021-05-23 NOTE — Telephone Encounter (Signed)
Spoke with patient. Patient rescheduled for Pre-op on 05/30/21 at 11:45am, arrive at 11:30am.  ? ?Routing to provider for final review. Patient is agreeable to disposition. Will close encounter. ? ?

## 2021-05-29 NOTE — Telephone Encounter (Signed)
Spoke with patient regarding surgery benefits. Patient acknowledges understanding of information presented. Patient is aware that benefits presented are professional benefits only. Patient is aware the hospital will call with facility benefits. See account note.  Patient stated that she has concerns regarding having surgery and does not know if she is going to go through with it. Patient has additional questions. Encouraged patient to keep her pre-op appointment with Dr. Talbert Nan for tomorrow so that she can discuss her concerns and decide how she would like to proceed.  Patient appreciative of call and understanding.  Routing to Glorianne Manchester, RN, and Dr. Talbert Nan as Juluis Rainier.  Encounter closed.

## 2021-05-30 ENCOUNTER — Encounter: Payer: Medicare Other | Admitting: Obstetrics and Gynecology

## 2021-05-30 ENCOUNTER — Telehealth: Payer: Self-pay | Admitting: Obstetrics and Gynecology

## 2021-05-30 DIAGNOSIS — Z0289 Encounter for other administrative examinations: Secondary | ICD-10-CM

## 2021-05-30 NOTE — Telephone Encounter (Signed)
Spoke with patient. Advised per Dr. Talbert Nan. Patient states she overslept and is very busy preparing for her upcoming trip. Patient states she expressed that she does not wish to proceed with surgery  "cutting me open" when she has had abnormal pap smears for a long time. Advised patient from our previous conversation that OV was recommended to further discuss results and recommendations, ultimately her decision whether to proceed with surgery or not. Strongly encouraged OV so that all of her questions/concerns to be addressed with provider.   Asked patient if she wanted to reschedule pre-op or cancel surgery? Patient rescheduled OV on 5/25 at 11am.   Dr. Talbert Nan -please provide update after OV. If Patient does not show for pre-op, will cancel surgery.

## 2021-05-30 NOTE — Progress Notes (Unsigned)
GYNECOLOGY  VISIT   HPI: 61 y.o.   Legally Separated Black or African American Not Hispanic or Latino  female   No obstetric history on file. with Patient's last menstrual period was 12/08/2011.   here for a preoperative visit. She had a colposcopy in 3/23 for LSIL pap, +HPV.  The colposcopy was  not satisfactory, no aceto-white changes. With application of lugols solution there were multiple small dots of decreased lugols uptake on the cervix (no TZ seen), the changes were seen to the end of the cervix /transition to the vagina. Cervical biopsy at 10 o'clock. She had cervical stenosis and required dilation to obtain an ECC.   A. CERVIX, 10 O'CLOCK, BIOPSY:  - Koilocytic atypia, consistent with low-grade squamous intraepithelial  lesion (CIN1, low-grade dysplasia)   B. ENDOCERVIX, CURETTAGE:  - Scant fragments of atypical squamous mucosa, consistent with at least  low-grade squamous intraepithelial lesion (CIN1, low grade dysplasia).  See comment   COMMENT:   B.  Only scant amount of squamous mucosa is present and shows increased  labeling for p16 immunostain. High-grade dysplasia (CIN2) cannot be  entirely ruled out.    GYNECOLOGIC HISTORY: Patient's last menstrual period was 12/08/2011. Contraception:PMP Menopausal hormone therapy: none        OB History   No obstetric history on file.        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   Arthritis 10/23/2016   OBESITY 05/10/2009   ANXIETY 05/10/2009   MIGRAINE HEADACHE 04/15/2009   PNEUMONIA, COMMUNITY ACQUIRED, PNEUMOCOCCAL 04/15/2009   ASTHMA 04/15/2009    Past Medical History:  Diagnosis Date   Anxiety    Arthritis    Asthma    Migraines    Pneumonia     Past Surgical History:  Procedure Laterality Date   CESAREAN SECTION     TOTAL KNEE ARTHROPLASTY Right 05/18/2019   Procedure: TOTAL KNEE ARTHROPLASTY;  Surgeon: Paralee Cancel, MD;  Location: [facility];  Service: Orthopedics;  Laterality: Right;  64mns    Current Outpatient Medications  Medication Sig Dispense Refill   acetaminophen (TYLENOL) 500 MG tablet Take 2 tablets (1,000 mg total) by mouth every 8 (eight) hours. 30 tablet 0   ALPRAZolam (XANAX) 0.25 MG tablet Take 0.25 mg by mouth 2 (two) times daily as needed for anxiety.   0   BELSOMRA 20 MG TABS Take 1 tablet by mouth at bedtime.     cyclobenzaprine (FLEXERIL) 10 MG tablet Take 10 mg by mouth daily as needed.     ferrous sulfate (FERROUSUL) 325 (65 FE) MG tablet Take 1 tablet (325 mg total) by mouth 3 (three) times daily with meals for 14 days. 42 tablet 0   OVER THE COUNTER MEDICATION Take 750 mg by mouth daily. [place]     oxyCODONE (OXY IR/ROXICODONE) 5 MG immediate release tablet Take 1-2 tablets (5-10 mg total) by mouth every 4 (four) hours as needed for moderate pain or severe pain. 60 tablet 0   phentermine (ADIPEX-P) 37.5 MG tablet Take 37.5 mg by mouth daily before breakfast.     polyethylene glycol (MIRALAX / GLYCOLAX) 17 g packet Take 17 g by mouth 2 (two) times daily. 28 packet 0   pravastatin (PRAVACHOL) 40 MG tablet SMARTSIG:1 Tablet(s) By Mouth Every Evening     QUEtiapine (SEROQUEL) 100 MG tablet Take 100 mg by mouth at bedtime.     Semaglutide, 1 MG/DOSE, (OZEMPIC, 1  MG/DOSE,) 2 MG/1.5ML SOPN Inject 1 mg into the skin every Friday.     topiramate (TOPAMAX) 100 MG tablet SMARTSIG:1 Tablet(s) By Mouth Every Evening     No current facility-administered medications for this visit.     ALLERGIES: Patient has no known allergies.  Family History  Problem Relation Age of Onset   Hypertension Other    Diabetes Other     Social History   Socioeconomic History   Marital status: Legally Separated    Spouse name: Not on file   Number of children: Not on file   Years of education: Not on file   Highest education level: Not on file  Occupational History   Not on file  Tobacco Use   Smoking status: Never    Smokeless tobacco: Never  Vaping Use   Vaping Use: Never used  Substance and Sexual Activity   Alcohol use: Not Currently    Comment: occ   Drug use: No   Sexual activity: Yes  Other Topics Concern   Not on file  Social History Narrative   Not on file   Social Determinants of Health   Financial Resource Strain: Not on file  Food Insecurity: Not on file  Transportation Needs: Not on file  Physical Activity: Not on file  Stress: Not on file  Social Connections: Not on file  Intimate Partner Violence: Not on file    ROS  PHYSICAL EXAMINATION:    LMP 12/08/2011     General appearance: alert, cooperative and appears stated age Neck: no adenopathy, supple, symmetrical, trachea midline and thyroid {CHL AMB PHY EX THYROID NORM DEFAULT:[id number]::"normal to inspection and palpation"} Heart: regular rate and rhythm Lungs: CTAB Abdomen: soft, non-tender; bowel sounds normal; no masses,  no organomegaly Extremities: normal, atraumatic, no cyanosis Skin: normal color, texture and turgor, no rashes or lesions Lymph: normal cervical supraclavicular and inguinal nodes Neurologic: grossly normal  1. 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 She had foci of dysplasia very lateral on her cervix, beyond the ability of a leep.  Plan loop cone cervical biopsy in the OR with laser of very lateral cervical dysplasia. Discussed risks of bleeding, infection, damage to nearby organs, need for further surgery.

## 2021-05-30 NOTE — Telephone Encounter (Signed)
This patient didn't show for her preop visit this am and told Hayley yesterday that she didn't think she wanted surgery. Please make sure she understands that her ECC returned with LSIL, can't r/o HSIL. HSIL can turn into cervical cancer.

## 2021-05-31 ENCOUNTER — Encounter: Payer: Self-pay | Admitting: Obstetrics and Gynecology

## 2021-05-31 ENCOUNTER — Ambulatory Visit (INDEPENDENT_AMBULATORY_CARE_PROVIDER_SITE_OTHER): Payer: Medicare Other | Admitting: Obstetrics and Gynecology

## 2021-05-31 VITALS — BP 110/64 | HR 88 | Ht 62.5 in | Wt 198.4 lb

## 2021-05-31 DIAGNOSIS — N879 Dysplasia of cervix uteri, unspecified: Secondary | ICD-10-CM

## 2021-05-31 NOTE — Progress Notes (Signed)
GYNECOLOGY  VISIT   HPI: 61 y.o.   Legally Separated Black or African American Not Hispanic or Latino  female   No obstetric history on file. with Audrey Weeks's last menstrual period was 12/08/2011.   here for a pre op office visit.  Audrey Weeks with LSIL/+HPV pap. Colposcopy biopsy with CIN I, ECC with low grade can't r/o HSIL. Colposcopy: not satisfactory, no aceto-white changes. With application of lugols solution there are multiple small dots of decreased lugols uptake on the cervix (no TZ seen), the changes are seen to the end of the cervix. Recommended loop cone in the OR with laser of dysplasia on the distal cervix.  GYNECOLOGIC HISTORY: Audrey Weeks's last menstrual period was 12/08/2011. Contraception:pmp  Menopausal hormone therapy: none         OB History   No obstetric history on file.        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   Arthritis 10/23/2016   OBESITY 05/10/2009   ANXIETY 05/10/2009   MIGRAINE HEADACHE 04/15/2009   PNEUMONIA, COMMUNITY ACQUIRED, PNEUMOCOCCAL 04/15/2009   ASTHMA 04/15/2009    Past Medical History:  Diagnosis Date   Anxiety    Arthritis    Asthma    Migraines    Pneumonia     Past Surgical History:  Procedure Laterality Date   CESAREAN SECTION     TOTAL KNEE ARTHROPLASTY Right 05/18/2019   Procedure: TOTAL KNEE ARTHROPLASTY;  Surgeon: Paralee Cancel, MD;  Location: [facility];  Service: Orthopedics;  Laterality: Right;  79mns    Current Outpatient Medications  Medication Sig Dispense Refill   acetaminophen (TYLENOL) 500 MG tablet Take 2 tablets (1,000 mg total) by mouth every 8 (eight) hours. 30 tablet 0   ALPRAZolam (XANAX) 0.25 MG tablet Take 0.25 mg by mouth 2 (two) times daily as needed for anxiety.   0   BELSOMRA 20 MG TABS Take 1 tablet by mouth at bedtime.     OVER THE COUNTER MEDICATION Take 750 mg by mouth daily. [place]     phentermine (ADIPEX-P) 37.5 MG tablet Take  37.5 mg by mouth daily before breakfast.     polyethylene glycol (MIRALAX / GLYCOLAX) 17 g packet Take 17 g by mouth 2 (two) times daily. 28 packet 0   pravastatin (PRAVACHOL) 40 MG tablet SMARTSIG:1 Tablet(s) By Mouth Every Evening     Semaglutide, 1 MG/DOSE, (OZEMPIC, 1 MG/DOSE,) 2 MG/1.5ML SOPN Inject 1 mg into the skin every Friday.     topiramate (TOPAMAX) 100 MG tablet SMARTSIG:1 Tablet(s) By Mouth Every Evening     cyclobenzaprine (FLEXERIL) 10 MG tablet Take 10 mg by mouth daily as needed. (Audrey Weeks not taking: Reported on 05/31/2021)     ferrous sulfate (FERROUSUL) 325 (65 FE) MG tablet Take 1 tablet (325 mg total) by mouth 3 (three) times daily with meals for 14 days. 42 tablet 0   oxyCODONE (OXY IR/ROXICODONE) 5 MG immediate release tablet Take 1-2 tablets (5-10 mg total) by mouth every 4 (four) hours as needed for moderate pain or severe pain. (Audrey Weeks not taking: Reported on 05/31/2021) 60 tablet 0   QUEtiapine (SEROQUEL) 100 MG tablet Take 100 mg by mouth at bedtime. (Audrey Weeks not taking: Reported on 05/31/2021)     No current facility-administered medications for this visit.     ALLERGIES: Audrey Weeks has no known allergies.  Family History  Problem Relation Age of Onset   Hypertension Other    Diabetes  Other     Social History   Socioeconomic History   Marital status: Legally Separated    Spouse name: Not on file   Number of children: Not on file   Years of education: Not on file   Highest education level: Not on file  Occupational History   Not on file  Tobacco Use   Smoking status: Never   Smokeless tobacco: Never  Vaping Use   Vaping Use: Never used  Substance and Sexual Activity   Alcohol use: Not Currently    Comment: occ   Drug use: No   Sexual activity: Yes  Other Topics Concern   Not on file  Social History Narrative   Not on file   Social Determinants of Health   Financial Resource Strain: Not on file  Food Insecurity: Not on file  Transportation  Needs: Not on file  Physical Activity: Not on file  Stress: Not on file  Social Connections: Not on file  Intimate Partner Violence: Not on file    Review of Systems  All other systems reviewed and are negative.  PHYSICAL EXAMINATION:    BP 110/64   Pulse 88   Ht 5' 2.5" (1.588 m)   Wt 198 lb 6.4 oz (90 kg)   LMP 12/08/2011   SpO2 99%   BMI 35.71 kg/m     General appearance: alert, cooperative and appears stated age Neck: no adenopathy, supple, symmetrical, trachea midline and thyroid normal to inspection and palpation Heart: regular rate and rhythm Lungs: CTAB Abdomen: soft, non-tender; bowel sounds normal; no masses,  no organomegaly Extremities: normal, atraumatic, no cyanosis Skin: normal color, texture and turgor, no rashes or lesions Lymph: normal cervical supraclavicular and inguinal nodes Neurologic: grossly normal  1. Cervical dysplasia ECC with LSIL, can't r/o HSIL, she has multiple foci of dysplasia on the distal cervix, near junction with the vagina. Plan loop cone in the OR with laser of dysplasia Discussed risks of bleeding, infection, cervical stenosis, recurrent dysplasia

## 2021-05-31 NOTE — H&P (View-Only) (Signed)
GYNECOLOGY  VISIT   HPI: 61 y.o.   Legally Separated Black or African American Not Hispanic or Latino  female   No obstetric history on file. with Patient's last menstrual period was 12/08/2011.   here for a pre op office visit.  Patient with LSIL/+HPV pap. Colposcopy biopsy with CIN I, ECC with low grade can't r/o HSIL. Colposcopy: not satisfactory, no aceto-white changes. With application of lugols solution there are multiple small dots of decreased lugols uptake on the cervix (no TZ seen), the changes are seen to the end of the cervix. Recommended loop cone in the OR with laser of dysplasia on the distal cervix.  GYNECOLOGIC HISTORY: Patient's last menstrual period was 12/08/2011. Contraception:pmp  Menopausal hormone therapy: none         OB History   No obstetric history on file.        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   Arthritis 10/23/2016   OBESITY 05/10/2009   ANXIETY 05/10/2009   MIGRAINE HEADACHE 04/15/2009   PNEUMONIA, COMMUNITY ACQUIRED, PNEUMOCOCCAL 04/15/2009   ASTHMA 04/15/2009    Past Medical History:  Diagnosis Date   Anxiety    Arthritis    Asthma    Migraines    Pneumonia     Past Surgical History:  Procedure Laterality Date   CESAREAN SECTION     TOTAL KNEE ARTHROPLASTY Right 05/18/2019   Procedure: TOTAL KNEE ARTHROPLASTY;  Surgeon: Paralee Cancel, MD;  Location: [facility];  Service: Orthopedics;  Laterality: Right;  6mns    Current Outpatient Medications  Medication Sig Dispense Refill   acetaminophen (TYLENOL) 500 MG tablet Take 2 tablets (1,000 mg total) by mouth every 8 (eight) hours. 30 tablet 0   ALPRAZolam (XANAX) 0.25 MG tablet Take 0.25 mg by mouth 2 (two) times daily as needed for anxiety.   0   BELSOMRA 20 MG TABS Take 1 tablet by mouth at bedtime.     OVER THE COUNTER MEDICATION Take 750 mg by mouth daily. [place]     phentermine (ADIPEX-P) 37.5 MG tablet Take  37.5 mg by mouth daily before breakfast.     polyethylene glycol (MIRALAX / GLYCOLAX) 17 g packet Take 17 g by mouth 2 (two) times daily. 28 packet 0   pravastatin (PRAVACHOL) 40 MG tablet SMARTSIG:1 Tablet(s) By Mouth Every Evening     Semaglutide, 1 MG/DOSE, (OZEMPIC, 1 MG/DOSE,) 2 MG/1.5ML SOPN Inject 1 mg into the skin every Friday.     topiramate (TOPAMAX) 100 MG tablet SMARTSIG:1 Tablet(s) By Mouth Every Evening     cyclobenzaprine (FLEXERIL) 10 MG tablet Take 10 mg by mouth daily as needed. (Patient not taking: Reported on 05/31/2021)     ferrous sulfate (FERROUSUL) 325 (65 FE) MG tablet Take 1 tablet (325 mg total) by mouth 3 (three) times daily with meals for 14 days. 42 tablet 0   oxyCODONE (OXY IR/ROXICODONE) 5 MG immediate release tablet Take 1-2 tablets (5-10 mg total) by mouth every 4 (four) hours as needed for moderate pain or severe pain. (Patient not taking: Reported on 05/31/2021) 60 tablet 0   QUEtiapine (SEROQUEL) 100 MG tablet Take 100 mg by mouth at bedtime. (Patient not taking: Reported on 05/31/2021)     No current facility-administered medications for this visit.     ALLERGIES: Patient has no known allergies.  Family History  Problem Relation Age of Onset   Hypertension Other    Diabetes  Other     Social History   Socioeconomic History   Marital status: Legally Separated    Spouse name: Not on file   Number of children: Not on file   Years of education: Not on file   Highest education level: Not on file  Occupational History   Not on file  Tobacco Use   Smoking status: Never   Smokeless tobacco: Never  Vaping Use   Vaping Use: Never used  Substance and Sexual Activity   Alcohol use: Not Currently    Comment: occ   Drug use: No   Sexual activity: Yes  Other Topics Concern   Not on file  Social History Narrative   Not on file   Social Determinants of Health   Financial Resource Strain: Not on file  Food Insecurity: Not on file  Transportation  Needs: Not on file  Physical Activity: Not on file  Stress: Not on file  Social Connections: Not on file  Intimate Partner Violence: Not on file    Review of Systems  All other systems reviewed and are negative.  PHYSICAL EXAMINATION:    BP 110/64   Pulse 88   Ht 5' 2.5" (1.588 m)   Wt 198 lb 6.4 oz (90 kg)   LMP 12/08/2011   SpO2 99%   BMI 35.71 kg/m     General appearance: alert, cooperative and appears stated age Neck: no adenopathy, supple, symmetrical, trachea midline and thyroid normal to inspection and palpation Heart: regular rate and rhythm Lungs: CTAB Abdomen: soft, non-tender; bowel sounds normal; no masses,  no organomegaly Extremities: normal, atraumatic, no cyanosis Skin: normal color, texture and turgor, no rashes or lesions Lymph: normal cervical supraclavicular and inguinal nodes Neurologic: grossly normal  1. Cervical dysplasia ECC with LSIL, can't r/o HSIL, she has multiple foci of dysplasia on the distal cervix, near junction with the vagina. Plan loop cone in the OR with laser of dysplasia Discussed risks of bleeding, infection, cervical stenosis, recurrent dysplasia

## 2021-06-05 ENCOUNTER — Encounter (HOSPITAL_BASED_OUTPATIENT_CLINIC_OR_DEPARTMENT_OTHER): Payer: Self-pay | Admitting: Obstetrics and Gynecology

## 2021-06-05 NOTE — Progress Notes (Signed)
Spoke w/ via phone for pre-op interview--- pt Lab needs dos----  no             Lab results------ no COVID test -----patient states asymptomatic no test needed Arrive at ------- 0530 on 06-11-2021 NPO after MN NO Solid Food.  Clear liquids from MN until--- 0430 Med rec completed Medications to take morning of surgery ----- none Diabetic medication ----- n/a Patient instructed no nail polish to be worn day of surgery Patient instructed to bring photo id and insurance card day of surgery Patient aware to have Driver (ride ) / caregiver for 24 hours after surgery --- daughter, Thornton Papas , tamyara Patient Special Instructions ----- n/a Pre-Op special Istructions ----- n/a Patient verbalized understanding of instructions that were given at this phone interview. Patient denies shortness of breath, chest pain, fever, cough at this phone interview.

## 2021-06-10 NOTE — Anesthesia Preprocedure Evaluation (Signed)
Anesthesia Evaluation  Patient identified by MRN, date of birth, ID band Patient awake    Reviewed: Allergy & Precautions, NPO status , Patient's Chart, lab work & pertinent test results  Airway Mallampati: II  TM Distance: >3 FB Neck ROM: Full    Dental no notable dental hx. (+) Teeth Intact, Dental Advisory Given   Pulmonary neg pulmonary ROS,    Pulmonary exam normal breath sounds clear to auscultation       Cardiovascular Exercise Tolerance: Good Normal cardiovascular exam Rhythm:Regular Rate:Normal     Neuro/Psych  Headaches,    GI/Hepatic negative GI ROS, Neg liver ROS,   Endo/Other  negative endocrine ROS  Renal/GU      Musculoskeletal  (+) Arthritis ,   Abdominal (+) + obese (BMI 35.71),   Peds  Hematology   Anesthesia Other Findings   Reproductive/Obstetrics                            Anesthesia Physical Anesthesia Plan  ASA: 3  Anesthesia Plan: General   Post-op Pain Management:    Induction: Intravenous  PONV Risk Score and Plan: 4 or greater and Treatment may vary due to age or medical condition, Midazolam, Ondansetron and Dexamethasone  Airway Management Planned: LMA  Additional Equipment: None  Intra-op Plan:   Post-operative Plan:   Informed Consent:     Dental advisory given  Plan Discussed with:   Anesthesia Plan Comments:         Anesthesia Quick Evaluation

## 2021-06-11 ENCOUNTER — Other Ambulatory Visit: Payer: Self-pay

## 2021-06-11 ENCOUNTER — Ambulatory Visit (HOSPITAL_BASED_OUTPATIENT_CLINIC_OR_DEPARTMENT_OTHER): Payer: Medicare Other | Admitting: Anesthesiology

## 2021-06-11 ENCOUNTER — Encounter (HOSPITAL_BASED_OUTPATIENT_CLINIC_OR_DEPARTMENT_OTHER)
Admission: RE | Disposition: A | Discharge status: Home / Self Care | Payer: Self-pay | Source: Home / Self Care | Attending: Obstetrics and Gynecology

## 2021-06-11 ENCOUNTER — Encounter (HOSPITAL_BASED_OUTPATIENT_CLINIC_OR_DEPARTMENT_OTHER): Payer: Self-pay | Admitting: Obstetrics and Gynecology

## 2021-06-11 ENCOUNTER — Ambulatory Visit (HOSPITAL_BASED_OUTPATIENT_CLINIC_OR_DEPARTMENT_OTHER)
Admission: RE | Admit: 2021-06-11 | Discharge: 2021-06-11 | Disposition: A | Discharge status: Home / Self Care | Payer: Medicare Other | Attending: Obstetrics and Gynecology | Admitting: Obstetrics and Gynecology

## 2021-06-11 DIAGNOSIS — N870 Mild cervical dysplasia: Secondary | ICD-10-CM | POA: Diagnosis present

## 2021-06-11 DIAGNOSIS — N879 Dysplasia of cervix uteri, unspecified: Secondary | ICD-10-CM

## 2021-06-11 DIAGNOSIS — Z01818 Encounter for other preprocedural examination: Secondary | ICD-10-CM

## 2021-06-11 DIAGNOSIS — N72 Inflammatory disease of cervix uteri: Secondary | ICD-10-CM | POA: Diagnosis not present

## 2021-06-11 DIAGNOSIS — M1990 Unspecified osteoarthritis, unspecified site: Secondary | ICD-10-CM

## 2021-06-11 HISTORY — DX: Dysplasia of cervix uteri, unspecified: N87.9

## 2021-06-11 HISTORY — DX: Presence of spectacles and contact lenses: Z97.3

## 2021-06-11 HISTORY — PX: LEEP: SHX91

## 2021-06-11 SURGERY — LEEP (LOOP ELECTROSURGICAL EXCISION PROCEDURE)
Anesthesia: General | Site: Cervix

## 2021-06-11 MED ORDER — LACTATED RINGERS IV SOLN: INTRAVENOUS | Status: DC

## 2021-06-11 MED ORDER — ACETAMINOPHEN 500 MG PO TABS
1000.0000 mg | ORAL_TABLET | ORAL | Status: AC
  Administered 2021-06-11: 1000 mg via ORAL

## 2021-06-11 MED ORDER — ONDANSETRON HCL 4 MG/2ML IJ SOLN: 4.0000 mg | Freq: Once | INTRAMUSCULAR | Status: DC | PRN

## 2021-06-11 MED ORDER — ACETAMINOPHEN 500 MG PO TABS
ORAL_TABLET | ORAL | Status: AC
  Filled 2021-06-11: qty 2

## 2021-06-11 MED ORDER — PROPOFOL 10 MG/ML IV BOLUS
INTRAVENOUS | Status: AC
  Filled 2021-06-11: qty 20

## 2021-06-11 MED ORDER — ONDANSETRON HCL 4 MG/2ML IJ SOLN
INTRAMUSCULAR | Status: DC | PRN
  Administered 2021-06-11: 4 mg via INTRAVENOUS

## 2021-06-11 MED ORDER — ONDANSETRON HCL 4 MG/2ML IJ SOLN
INTRAMUSCULAR | Status: AC
  Filled 2021-06-11: qty 2

## 2021-06-11 MED ORDER — LIDOCAINE HCL (PF) 2 % IJ SOLN
INTRAMUSCULAR | Status: AC
  Filled 2021-06-11: qty 5

## 2021-06-11 MED ORDER — FENTANYL CITRATE (PF) 100 MCG/2ML IJ SOLN
INTRAMUSCULAR | Status: DC | PRN
  Administered 2021-06-11: 50 ug via INTRAVENOUS
  Administered 2021-06-11: 25 ug via INTRAVENOUS
  Administered 2021-06-11 (×2): 50 ug via INTRAVENOUS
  Administered 2021-06-11: 25 ug via INTRAVENOUS

## 2021-06-11 MED ORDER — OXYCODONE HCL 5 MG/5ML PO SOLN: 5.0000 mg | Freq: Once | ORAL | Status: DC | PRN

## 2021-06-11 MED ORDER — HYDROMORPHONE HCL 1 MG/ML IJ SOLN: 0.2500 mg | INTRAMUSCULAR | Status: DC | PRN

## 2021-06-11 MED ORDER — LIDOCAINE-EPINEPHRINE (PF) 1 %-1:200000 IJ SOLN
INTRAMUSCULAR | Status: DC | PRN
  Administered 2021-06-11: 8 mL

## 2021-06-11 MED ORDER — FERRIC SUBSULFATE (BULK) SOLN
Status: DC | PRN
  Administered 2021-06-11: 1

## 2021-06-11 MED ORDER — MIDAZOLAM HCL 5 MG/5ML IJ SOLN
INTRAMUSCULAR | Status: DC | PRN
  Administered 2021-06-11: 2 mg via INTRAVENOUS

## 2021-06-11 MED ORDER — PROPOFOL 10 MG/ML IV BOLUS
INTRAVENOUS | Status: DC | PRN
  Administered 2021-06-11: 150 mg via INTRAVENOUS
  Administered 2021-06-11: 50 mg via INTRAVENOUS

## 2021-06-11 MED ORDER — KETOROLAC TROMETHAMINE 30 MG/ML IJ SOLN
INTRAMUSCULAR | Status: DC | PRN
  Administered 2021-06-11: 30 mg via INTRAVENOUS

## 2021-06-11 MED ORDER — KETOROLAC TROMETHAMINE 30 MG/ML IJ SOLN: 30.0000 mg | Freq: Once | INTRAMUSCULAR | Status: DC | PRN

## 2021-06-11 MED ORDER — IODINE STRONG (LUGOLS) 5 % PO SOLN
ORAL | Status: DC | PRN
  Administered 2021-06-11: 1 mL

## 2021-06-11 MED ORDER — DEXMEDETOMIDINE (PRECEDEX) IN NS 20 MCG/5ML (4 MCG/ML) IV SYRINGE
PREFILLED_SYRINGE | INTRAVENOUS | Status: DC | PRN
  Administered 2021-06-11: 12 ug via INTRAVENOUS
  Administered 2021-06-11: 8 ug via INTRAVENOUS

## 2021-06-11 MED ORDER — OXYCODONE HCL 5 MG PO TABS: 5.0000 mg | ORAL_TABLET | Freq: Once | ORAL | Status: DC | PRN

## 2021-06-11 MED ORDER — DEXMEDETOMIDINE (PRECEDEX) IN NS 20 MCG/5ML (4 MCG/ML) IV SYRINGE
PREFILLED_SYRINGE | INTRAVENOUS | Status: AC
  Filled 2021-06-11: qty 5

## 2021-06-11 MED ORDER — FENTANYL CITRATE (PF) 100 MCG/2ML IJ SOLN
INTRAMUSCULAR | Status: AC
  Filled 2021-06-11: qty 2

## 2021-06-11 MED ORDER — KETOROLAC TROMETHAMINE 30 MG/ML IJ SOLN
INTRAMUSCULAR | Status: AC
  Filled 2021-06-11: qty 1

## 2021-06-11 MED ORDER — LIDOCAINE HCL (CARDIAC) PF 100 MG/5ML IV SOSY
PREFILLED_SYRINGE | INTRAVENOUS | Status: DC | PRN
  Administered 2021-06-11: 100 mg via INTRAVENOUS

## 2021-06-11 MED ORDER — STERILE WATER FOR IRRIGATION IR SOLN
Status: DC | PRN
  Administered 2021-06-11: 1000 mL

## 2021-06-11 MED ORDER — DEXAMETHASONE SODIUM PHOSPHATE 4 MG/ML IJ SOLN
INTRAMUSCULAR | Status: DC | PRN
  Administered 2021-06-11: 10 mg via INTRAVENOUS

## 2021-06-11 MED ORDER — DEXAMETHASONE SODIUM PHOSPHATE 10 MG/ML IJ SOLN
INTRAMUSCULAR | Status: AC
  Filled 2021-06-11: qty 1

## 2021-06-11 MED ORDER — MIDAZOLAM HCL 2 MG/2ML IJ SOLN
INTRAMUSCULAR | Status: AC
  Filled 2021-06-11: qty 2

## 2021-06-11 SURGICAL SUPPLY — 36 items
APL SWBSTK 6 STRL LF DISP (MISCELLANEOUS) ×4
APPLICATOR COTTON TIP 6 STRL (MISCELLANEOUS) ×3 IMPLANT
APPLICATOR COTTON TIP 6IN STRL (MISCELLANEOUS) ×6
ELECT BALL LEEP 3MM BLK (ELECTRODE) IMPLANT
ELECT BALL LEEP 5MM RED (ELECTRODE) ×3 IMPLANT
ELECT LEEP 2.0X0.8 R2008 (MISCELLANEOUS) ×6
ELECT LLETZ BALL 5MM DISP (ELECTRODE) ×2 IMPLANT
ELECT LOOP LEEP RND 10X10 YLW (CUTTING LOOP) ×3
ELECT LOOP LEEP RND 15X12 GRN (CUTTING LOOP)
ELECT LOOP LEEP RND 20X12 WHT (CUTTING LOOP)
ELECT LOOP LEEP SQR 10X10 ORG (CUTTING LOOP)
ELECT LOOP LLETZ 10X10 DISP (CUTTING LOOP) ×3
ELECT REM PT RETURN 9FT ADLT (ELECTROSURGICAL) ×3
ELECTRODE LEEP 2.0X0.8 R2008 (MISCELLANEOUS) ×3 IMPLANT
ELECTRODE LOOP LP RND 10X10YLW (CUTTING LOOP) ×2 IMPLANT
ELECTRODE LOOP LP RND 15X12GRN (CUTTING LOOP) IMPLANT
ELECTRODE LOOP LP RND 20X12WHT (CUTTING LOOP) IMPLANT
ELECTRODE LOOP LP SQR 10X10ORG (CUTTING LOOP) IMPLANT
ELECTRODE LOOP LTZ 10X10 DISP (CUTTING LOOP) ×1 IMPLANT
ELECTRODE REM PT RTRN 9FT ADLT (ELECTROSURGICAL) ×2 IMPLANT
EXTENDER ELECT LOOP LEEP 10CM (CUTTING LOOP) IMPLANT
GAUZE 4X4 16PLY ~~LOC~~+RFID DBL (SPONGE) ×3 IMPLANT
GLOVE BIOGEL PI IND STRL 7.0 (GLOVE) ×4 IMPLANT
GLOVE BIOGEL PI INDICATOR 7.0 (GLOVE) ×2
GLOVE ECLIPSE 6.5 STRL STRAW (GLOVE) ×3 IMPLANT
GOWN STRL REUS W/TWL LRG LVL3 (GOWN DISPOSABLE) ×6 IMPLANT
KIT TURNOVER CYSTO (KITS) ×3 IMPLANT
NS IRRIG 1000ML POUR BTL (IV SOLUTION) ×3 IMPLANT
PACK VAGINAL WOMENS (CUSTOM PROCEDURE TRAY) ×3 IMPLANT
PAD OB MATERNITY 4.3X12.25 (PERSONAL CARE ITEMS) ×3 IMPLANT
PENCIL SMOKE EVACUATOR (MISCELLANEOUS) ×3 IMPLANT
SCOPETTES 8  STERILE (MISCELLANEOUS) ×6
SCOPETTES 8 STERILE (MISCELLANEOUS) ×4 IMPLANT
SUT SILK 2 0 SH (SUTURE) ×3 IMPLANT
TOWEL OR 17X26 10 PK STRL BLUE (TOWEL DISPOSABLE) ×6 IMPLANT
TUBE CONNECTING 12X1/4 (SUCTIONS) ×3 IMPLANT

## 2021-06-11 NOTE — Anesthesia Procedure Notes (Signed)
Procedure Name: LMA Insertion Date/Time: 06/11/2021 7:38 AM Performed by: Justice Rocher, CRNA Pre-anesthesia Checklist: Patient identified, Emergency Drugs available, Suction available, Patient being monitored and Timeout performed Patient Re-evaluated:Patient Re-evaluated prior to induction Oxygen Delivery Method: Circle system utilized Preoxygenation: Pre-oxygenation with 100% oxygen Induction Type: IV induction Ventilation: Mask ventilation without difficulty LMA: LMA inserted LMA Size: 4.0 Number of attempts: 1 Airway Equipment and Method: Bite block Placement Confirmation: positive ETCO2, breath sounds checked- equal and bilateral and CO2 detector Tube secured with: Tape Dental Injury: Teeth and Oropharynx as per pre-operative assessment

## 2021-06-11 NOTE — Op Note (Signed)
Preoperative Diagnosis: Cervical dysplasia  Postoperative Diagnosis: Same  Procedure: loop cone cervical biopsy   Surgeon: Dr Sumner Boast  Assistants: None  Anesthesia: General via LMA  EBL: 5 cc  Fluids: 300 cc  Urine output: not recorded  Indications for surgery: The patient is a 61 yo female, who presented with a LSIL/+HPV pap. A colposcopy was performed, it was not satisfactory. With application of lugols solution there were many small dots of decreased lugols uptake on the lateral cervix, near the junction to the vagina. Biopsy of one of these dots returned with CIN I, ECC returned with LSIL can't r/o HSIL.   The patient was counseled as to the risks of the procedure, including: infection, bleeding, persistent dysplasia and cervical stenosis.The consent form was signed prior to her surgery.  Findings: Colposcopy was performed, not satisfactory, there were no longer dots of decreased lugols uptake, but the edge of decreased lugols uptake extended to the lateral cervix.   Specimens: loop cone exocervix, loop cone endocervix, endocervical curettage  Procedure: The patient was taken to the operating room with an IV in place. She was placed in the dorsal lithotomy position and anesthesia was administered. She voided on the way to the OR. Her perineum and vagina were prepped with Hibiclens.  A coated speculum was placed in the vagina. Under colposcopic guidance, Lugols solution was placed on the cervix and a paracervical block was injected using 1% lidocaine with epinephrine. Under colposcopic guidance, the 2 x 0.8 cm loop was used to remove a portion of the ectocervix taking care to get the entire transformation zone. It was removed in 3 specimens (not oriented).  A second 1 x 1 cm loop was used to remove a portion of the endocervix. The settings were 55 cut, 71 coag with a blend of 1.  An ECC was performed. The cautery ball was then used to cauterize the base of the biopsy site and  just lateral to the biopsy site. Monsels were placed. There were no remaining areas of decreased lugols uptake and further treatment with the laser was not needed.    The patient tolerated the procedure well.   Upon awakening the LMA was removed and the patient was transferred to the recovery room in stable and awake condition.  The sponge and instrument count were correct.

## 2021-06-11 NOTE — Interval H&P Note (Signed)
History and Physical Interval Note:  06/11/2021 7:14 AM  Audrey Weeks  has presented today for surgery, with the diagnosis of cervical dysplasia.  The various methods of treatment have been discussed with the patient and family. After consideration of risks, benefits and other options for treatment, the patient has consented to  Procedure(s) with comments: LOOP ELECTROSURGICAL EXCISION PROCEDURE (LEEP) with colposcopy (N/A) - Wants the laser to attach to the colposcope CO2 LASER APPLICATION (N/A) as a surgical intervention.  The patient's history has been reviewed, patient examined, no change in status, stable for surgery.  I have reviewed the patient's chart and labs.  Questions were answered to the patient's satisfaction.     Salvadore Dom

## 2021-06-11 NOTE — Anesthesia Postprocedure Evaluation (Signed)
Anesthesia Post Note  Patient: Audrey Weeks  Procedure(s) Performed: LOOP ELECTROSURGICAL EXCISION PROCEDURE (LEEP) with colposcopy (Cervix)     Patient location during evaluation: PACU Anesthesia Type: General Level of consciousness: awake and alert Pain management: pain level controlled Vital Signs Assessment: post-procedure vital signs reviewed and stable Respiratory status: spontaneous breathing, nonlabored ventilation, respiratory function stable and patient connected to nasal cannula oxygen Cardiovascular status: blood pressure returned to baseline and stable Postop Assessment: no apparent nausea or vomiting Anesthetic complications: no   No notable events documented.  Last Vitals:  Vitals:   06/11/21 0900 06/11/21 0930  BP: (!) 144/94 (!) 142/93  Pulse: 69 71  Resp: 14 16  Temp:  (!) 36.4 C  SpO2: 100% 100%    Last Pain:  Vitals:   06/11/21 0930  TempSrc:   PainSc: 0-No pain                 Barnet Glasgow

## 2021-06-11 NOTE — Transfer of Care (Signed)
Immediate Anesthesia Transfer of Care Note  Patient: Audrey Weeks  Procedure(s) Performed: Procedure(s) (LRB): LOOP ELECTROSURGICAL EXCISION PROCEDURE (LEEP) with colposcopy (N/A)  Patient Location: PACU  Anesthesia Type: General  Level of Consciousness: awake, sedated, patient cooperative and responds to stimulation  Airway & Oxygen Therapy: Patient Spontanous Breathing and Patient connected to Icard 02 and soft FM  Post-op Assessment: Report given to PACU RN, Post -op Vital signs reviewed and stable and Patient moving all extremities  Post vital signs: Reviewed and stable  Complications: No apparent anesthesia complications

## 2021-06-12 ENCOUNTER — Encounter (HOSPITAL_BASED_OUTPATIENT_CLINIC_OR_DEPARTMENT_OTHER): Payer: Self-pay | Admitting: Obstetrics and Gynecology

## 2021-06-13 LAB — SURGICAL PATHOLOGY

## 2021-06-18 ENCOUNTER — Telehealth: Payer: Self-pay

## 2021-06-18 NOTE — Telephone Encounter (Signed)
Had  LEEP procedure 06/11/21.  She said her family has not let her return to work and she has been in bed mostly since she had the procedure. She said she is "doing well-I'm okay". Still a little pain "like cramps", like "contractions" (felt this last last night)but is not persistent all the time. Just occasionally. Still light bleeding. .Wearing a pad. More than spotting. Taking Ibuprofen. She questions is all of this normal as her body returns to normal.   Mentioned that she went to graduation ceremony on Sunday with family and did fine but other than that the family has made her stay in bed.  She states she is ready to return to work. She is in housekeeping. She would like a note to return to work.

## 2021-06-18 NOTE — Telephone Encounter (Signed)
Patient said she would like to have visit before returning to work. Scheduled her 06/20/21 at 4pm.

## 2021-06-18 NOTE — Telephone Encounter (Signed)
I would not expect her to still be having issues. Typically people go back to work a day or two after the procedure. If she is concerned, I can see her. She certainly should be able to return to work.

## 2021-06-20 ENCOUNTER — Encounter: Payer: Self-pay | Admitting: Obstetrics and Gynecology

## 2021-06-20 ENCOUNTER — Ambulatory Visit (INDEPENDENT_AMBULATORY_CARE_PROVIDER_SITE_OTHER): Payer: Medicare Other | Admitting: Obstetrics and Gynecology

## 2021-06-20 VITALS — BP 136/72 | HR 72 | Wt 200.0 lb

## 2021-06-20 DIAGNOSIS — Z9889 Other specified postprocedural states: Secondary | ICD-10-CM | POA: Diagnosis not present

## 2021-06-20 NOTE — Progress Notes (Signed)
GYNECOLOGY  VISIT   HPI: 61 y.o.   Legally Separated Black or African American Not Hispanic or Latino  female   No obstetric history on file. with Patient's last menstrual period was 12/08/2011.   here for LEEP follow up. She has had some cramping, starting to feel better. She was in bed for 1-2 weeks after the leep, just didn't feel well. No fever, felt cramps. She is ready to return to work.   GYNECOLOGIC HISTORY: Patient's last menstrual period was 12/08/2011. Contraception:PMP  Menopausal hormone therapy: none        OB History   No obstetric history on file.        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   Arthritis 10/23/2016   OBESITY 05/10/2009   ANXIETY 05/10/2009   MIGRAINE HEADACHE 04/15/2009   PNEUMONIA, COMMUNITY ACQUIRED, PNEUMOCOCCAL 04/15/2009   ASTHMA 04/15/2009    Past Medical History:  Diagnosis Date   Anxiety    Arthritis    back and knee   Cervical dysplasia    History of Bell's palsy 2015   right side, per pt resolved   Migraines    Wears glasses     Past Surgical History:  Procedure Laterality Date   CESAREAN SECTION     HYSTEROSCOPY WITH D & C  07/28/2002   '@[facility]    LEEP N/A 06/11/2021   Procedure: LOOP ELECTROSURGICAL EXCISION PROCEDURE (LEEP) with colposcopy;  Surgeon: Salvadore Dom, MD;  Location: [facility];  Service: Gynecology;  Laterality: N/A;  Wants the laser to attach to the colposcope   TOTAL KNEE ARTHROPLASTY Right 05/18/2019   Procedure: TOTAL KNEE ARTHROPLASTY;  Surgeon: Paralee Cancel, MD;  Location: [facility];  Service: Orthopedics;  Laterality: Right;  3mns    Current Outpatient Medications  Medication Sig Dispense Refill   acetaminophen (TYLENOL) 500 MG tablet Take 500 mg by mouth every 6 (six) hours as needed.     ALPRAZolam (XANAX) 0.25 MG tablet Take 0.25 mg by mouth 2 (two) times daily as needed for anxiety.   0   BELSOMRA 20 MG TABS Take  1 tablet by mouth at bedtime as needed.     Cholecalciferol (VITAMIN D3 GUMMIES PO) Take by mouth daily.     cyclobenzaprine (FLEXERIL) 10 MG tablet Take 10 mg by mouth daily as needed.     ibuprofen (ADVIL) 200 MG tablet Take 200 mg by mouth every 6 (six) hours as needed.     oxyCODONE-acetaminophen (PERCOCET/ROXICET) 5-325 MG tablet Take by mouth 4 (four) times daily as needed for severe pain.     phentermine (ADIPEX-P) 37.5 MG tablet Take 37.5 mg by mouth daily before breakfast.     Polyethylene Glycol 3350 (MIRALAX PO) Take by mouth as needed.     pravastatin (PRAVACHOL) 40 MG tablet Take 40 mg by mouth at bedtime.     QUEtiapine (SEROQUEL) 100 MG tablet Take 100 mg by mouth at bedtime as needed.     Semaglutide, 1 MG/DOSE, (OZEMPIC, 1 MG/DOSE,) 2 MG/1.5ML SOPN Inject 1 mg into the skin every Friday.     topiramate (TOPAMAX) 100 MG tablet Take 100 mg by mouth at bedtime.     No current facility-administered medications for this visit.     ALLERGIES: Patient has no allergy information on record.  Family History  Problem Relation Age of Onset   Hypertension Other    Diabetes Other  Social History   Socioeconomic History   Marital status: Legally Separated    Spouse name: Not on file   Number of children: Not on file   Years of education: Not on file   Highest education level: Not on file  Occupational History   Not on file  Tobacco Use   Smoking status: Never   Smokeless tobacco: Never  Vaping Use   Vaping Use: Never used  Substance and Sexual Activity   Alcohol use: Not Currently    Comment: occasional   Drug use: No   Sexual activity: Yes    Birth control/protection: Post-menopausal  Other Topics Concern   Not on file  Social History Narrative   Not on file   Social Determinants of Health   Financial Resource Strain: Not on file  Food Insecurity: Not on file  Transportation Needs: Not on file  Physical Activity: Not on file  Stress: Not on file  Social  Connections: Not on file  Intimate Partner Violence: Not on file    Review of Systems  All other systems reviewed and are negative.   PHYSICAL EXAMINATION:    BP 136/72   Pulse 72   Wt 200 lb (90.7 kg)   LMP 12/08/2011   SpO2 98%   BMI 36.00 kg/m     General appearance: alert, cooperative and appears stated age Abdomen: soft, non-tender; non distended, no masses,  no organomegaly  Pelvic: External genitalia:  no lesions              Urethra:  normal appearing urethra with no masses, tenderness or lesions              Bartholins and Skenes: normal                 Vagina: normal appearing vagina with normal color and discharge, no lesions              Cervix: no lesions and healing well s/p leep, friable. No CMT.              Bimanual Exam:  Uterus:   no masses or tenderness              Adnexa: no mass, fullness, tenderness                Chaperone was present for exam.  1. History of loop electrical excision procedure (LEEP) She was having a lot of cramping after the procedure, feeling better. No signs of infection. Needs f/u pap in 6 months

## 2021-07-04 ENCOUNTER — Encounter: Payer: Medicare Other | Admitting: Obstetrics and Gynecology

## 2021-07-05 ENCOUNTER — Ambulatory Visit (INDEPENDENT_AMBULATORY_CARE_PROVIDER_SITE_OTHER): Payer: Medicare Other | Admitting: Obstetrics and Gynecology

## 2021-07-05 ENCOUNTER — Encounter: Payer: Self-pay | Admitting: Obstetrics and Gynecology

## 2021-07-05 VITALS — BP 126/84

## 2021-07-05 DIAGNOSIS — Z9889 Other specified postprocedural states: Secondary | ICD-10-CM | POA: Diagnosis not present

## 2021-07-05 NOTE — Progress Notes (Signed)
GYNECOLOGY  VISIT   HPI: 61 y.o.   Legally Separated Black or African American Not Hispanic or Latino  female   No obstetric history on file. with Patient's last menstrual period was 12/08/2011.   here for f/u leep.  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 Colposcopy: not satisfactory. Loop cone from 06/11/21 was negative for dysplasia, ECC non diagnostic, no tissue recovered.   GYNECOLOGIC HISTORY: Patient's last menstrual period was 12/08/2011. Contraception:PMP Menopausal hormone therapy: None        OB History   No obstetric history on file.        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   Arthritis 10/23/2016   OBESITY 05/10/2009   ANXIETY 05/10/2009   MIGRAINE HEADACHE 04/15/2009   PNEUMONIA, COMMUNITY ACQUIRED, PNEUMOCOCCAL 04/15/2009   ASTHMA 04/15/2009    Past Medical History:  Diagnosis Date   Anxiety    Arthritis    back and knee   Cervical dysplasia    History of Bell's palsy 2015   right side, per pt resolved   Migraines    Wears glasses     Past Surgical History:  Procedure Laterality Date   CESAREAN SECTION     HYSTEROSCOPY WITH D & C  07/28/2002   '@[facility]'$    LEEP N/A 06/11/2021   Procedure: LOOP ELECTROSURGICAL EXCISION PROCEDURE (LEEP) with colposcopy;  Surgeon: Salvadore Dom, MD;  Location: [facility];  Service: Gynecology;  Laterality: N/A;  Wants the laser to attach to the colposcope   TOTAL KNEE ARTHROPLASTY Right 05/18/2019   Procedure: TOTAL KNEE ARTHROPLASTY;  Surgeon: Paralee Cancel, MD;  Location: [facility];  Service: Orthopedics;  Laterality: Right;  90mns    Current Outpatient Medications  Medication Sig Dispense Refill   acetaminophen (TYLENOL) 500 MG tablet Take 500 mg by mouth every 6 (six) hours as needed.     ALPRAZolam (XANAX) 0.25 MG tablet Take 0.25 mg by mouth 2 (two) times daily as needed for anxiety.   0   BELSOMRA 20 MG  TABS Take 1 tablet by mouth at bedtime as needed.     Cholecalciferol (VITAMIN D3 GUMMIES PO) Take by mouth daily.     cyclobenzaprine (FLEXERIL) 10 MG tablet Take 10 mg by mouth daily as needed.     ibuprofen (ADVIL) 200 MG tablet Take 200 mg by mouth every 6 (six) hours as needed.     oxyCODONE-acetaminophen (PERCOCET/ROXICET) 5-325 MG tablet Take by mouth 4 (four) times daily as needed for severe pain.     phentermine (ADIPEX-P) 37.5 MG tablet Take 37.5 mg by mouth daily before breakfast.     Polyethylene Glycol 3350 (MIRALAX PO) Take by mouth as needed.     pravastatin (PRAVACHOL) 40 MG tablet Take 40 mg by mouth at bedtime.     Semaglutide, 1 MG/DOSE, (OZEMPIC, 1 MG/DOSE,) 2 MG/1.5ML SOPN Inject 1 mg into the skin every Friday.     topiramate (TOPAMAX) 100 MG tablet Take 100 mg by mouth at bedtime.     QUEtiapine (SEROQUEL) 100 MG tablet Take 100 mg by mouth at bedtime as needed. (Patient not taking: Reported on 07/05/2021)     No current facility-administered medications for this visit.     ALLERGIES: Patient has no known allergies.  Family History  Problem Relation Age of Onset   Hypertension Other    Diabetes Other  Social History   Socioeconomic History   Marital status: Legally Separated    Spouse name: Not on file   Number of children: Not on file   Years of education: Not on file   Highest education level: Not on file  Occupational History   Not on file  Tobacco Use   Smoking status: Never   Smokeless tobacco: Never  Vaping Use   Vaping Use: Never used  Substance and Sexual Activity   Alcohol use: Not Currently    Comment: occasional   Drug use: No   Sexual activity: Yes    Birth control/protection: Post-menopausal  Other Topics Concern   Not on file  Social History Narrative   Not on file   Social Determinants of Health   Financial Resource Strain: Not on file  Food Insecurity: Not on file  Transportation Needs: Not on file  Physical Activity: Not  on file  Stress: Not on file  Social Connections: Not on file  Intimate Partner Violence: Not on file    ROS  PHYSICAL EXAMINATION:    LMP 12/08/2011     General appearance: alert, cooperative and appears stated age  Pelvic: External genitalia:  no lesions              Urethra:  normal appearing urethra with no masses, tenderness or lesions              Bartholins and Skenes: normal                 Vagina: atrophic appearing vagina with normal color and discharge, no lesions              Cervix: no lesions and healing well, still appears friable               Chaperone was present for exam.  1. H/O LEEP Healing well F/u pap in 5 months

## 2021-08-14 ENCOUNTER — Emergency Department (HOSPITAL_COMMUNITY)
Admission: EM | Admit: 2021-08-14 | Discharge: 2021-08-15 | Disposition: A | Discharge status: Home / Self Care | Payer: Medicare Other | Attending: Emergency Medicine | Admitting: Emergency Medicine

## 2021-08-14 ENCOUNTER — Other Ambulatory Visit: Payer: Self-pay

## 2021-08-14 DIAGNOSIS — M1990 Unspecified osteoarthritis, unspecified site: Secondary | ICD-10-CM | POA: Insufficient documentation

## 2021-08-14 DIAGNOSIS — I776 Arteritis, unspecified: Secondary | ICD-10-CM | POA: Diagnosis not present

## 2021-08-14 DIAGNOSIS — Z20822 Contact with and (suspected) exposure to covid-19: Secondary | ICD-10-CM | POA: Insufficient documentation

## 2021-08-14 DIAGNOSIS — F419 Anxiety disorder, unspecified: Secondary | ICD-10-CM | POA: Diagnosis not present

## 2021-08-14 DIAGNOSIS — L299 Pruritus, unspecified: Secondary | ICD-10-CM | POA: Diagnosis not present

## 2021-08-14 DIAGNOSIS — G43909 Migraine, unspecified, not intractable, without status migrainosus: Secondary | ICD-10-CM | POA: Insufficient documentation

## 2021-08-14 DIAGNOSIS — R519 Headache, unspecified: Secondary | ICD-10-CM | POA: Diagnosis present

## 2021-08-14 NOTE — ED Triage Notes (Signed)
Patient coming to ED for evaluation of headache, blurred vision, nausea, "feeling like bugs are biting me,"  and "I'm just not myself."  Reports she went to urgent care and was instructed to come here for "vasculitis and needing lab work."  Reports symptoms started "over a week ago."

## 2021-08-15 ENCOUNTER — Telehealth (HOSPITAL_COMMUNITY): Payer: Self-pay | Admitting: Emergency Medicine

## 2021-08-15 ENCOUNTER — Emergency Department (HOSPITAL_COMMUNITY): Payer: Medicare Other

## 2021-08-15 DIAGNOSIS — R519 Headache, unspecified: Secondary | ICD-10-CM | POA: Diagnosis not present

## 2021-08-15 LAB — CBC WITH DIFFERENTIAL/PLATELET
Abs Immature Granulocytes: 0.03 10*3/uL (ref 0.00–0.07)
Basophils Absolute: 0 10*3/uL (ref 0.0–0.1)
Basophils Relative: 0 %
Eosinophils Absolute: 0.1 10*3/uL (ref 0.0–0.5)
Eosinophils Relative: 1 %
HCT: 38.7 % (ref 36.0–46.0)
Hemoglobin: 13.1 g/dL (ref 12.0–15.0)
Immature Granulocytes: 0 %
Lymphocytes Relative: 52 %
Lymphs Abs: 3.8 10*3/uL (ref 0.7–4.0)
MCH: 30.3 pg (ref 26.0–34.0)
MCHC: 33.9 g/dL (ref 30.0–36.0)
MCV: 89.4 fL (ref 80.0–100.0)
Monocytes Absolute: 0.5 10*3/uL (ref 0.1–1.0)
Monocytes Relative: 6 %
Neutro Abs: 3 10*3/uL (ref 1.7–7.7)
Neutrophils Relative %: 41 %
Platelets: 306 10*3/uL (ref 150–400)
RBC: 4.33 MIL/uL (ref 3.87–5.11)
RDW: 13.8 % (ref 11.5–15.5)
WBC: 7.3 10*3/uL (ref 4.0–10.5)
nRBC: 0 % (ref 0.0–0.2)

## 2021-08-15 LAB — BASIC METABOLIC PANEL
Anion gap: 8 (ref 5–15)
BUN: 17 mg/dL (ref 8–23)
CO2: 20 mmol/L — ABNORMAL LOW (ref 22–32)
Calcium: 8.9 mg/dL (ref 8.9–10.3)
Chloride: 111 mmol/L (ref 98–111)
Creatinine, Ser: 0.59 mg/dL (ref 0.44–1.00)
GFR, Estimated: >60 mL/min (ref >60)
Glucose, Bld: 108 mg/dL — ABNORMAL HIGH (ref 70–99)
Potassium: 3.4 mmol/L — ABNORMAL LOW (ref 3.5–5.1)
Sodium: 139 mmol/L (ref 135–145)

## 2021-08-15 LAB — PROTIME-INR
INR: 1 (ref 0.8–1.2)
Prothrombin Time: 12.8 seconds (ref 11.4–15.2)

## 2021-08-15 LAB — SARS CORONAVIRUS 2 BY RT PCR: SARS Coronavirus 2 by RT PCR: NEGATIVE

## 2021-08-15 MED ORDER — HYDROXYZINE HCL 25 MG PO TABS: 25.0000 mg | ORAL_TABLET | Freq: Four times a day (QID) | ORAL | 0 refills | Status: AC

## 2021-08-15 MED ORDER — OXYCODONE-ACETAMINOPHEN 5-325 MG PO TABS
1.0000 | ORAL_TABLET | Freq: Once | ORAL | Status: AC
  Administered 2021-08-15: 1 via ORAL
  Filled 2021-08-15: qty 1

## 2021-08-15 NOTE — ED Provider Notes (Signed)
[facility] DEPT Provider Note   CSN: [id number] Arrival date & time: 08/14/21  1959     History  Chief Complaint  Patient presents with   Headache    Audrey Weeks is a 61 y.o. female.  61 year old female with past medical history of migraines, arthritis, anxiety and Bell's palsy presents with complaint of itching and body pain for the past several days.  States that she has had purpleish spots pop up on her body at times.  Patient went to urgent care today for a headache with body aches and intermittent purpleish spots on her body, was diagnosed with vasculitis.  Patient looked this up online and agrees that this is what she has.  She denies having any lesions on her skin at this time.  No fevers.  No other complaints or concerns.       Home Medications Prior to Admission medications   Medication Sig Start Date End Date Taking? Authorizing Provider  acetaminophen (TYLENOL) 500 MG tablet Take 500 mg by mouth every 6 (six) hours as needed for moderate pain.   Yes [provider]  albuterol (VENTOLIN HFA) 108 (90 Base) MCG/ACT inhaler Inhale 1-2 puffs into the lungs every 6 (six) hours as needed for wheezing or shortness of breath.   Yes [provider]  BELSOMRA 20 MG TABS Take 20 mg by mouth at bedtime as needed (for sleep). 01/29/21  Yes [provider]  Cholecalciferol (VITAMIN D3 GUMMIES PO) Take 2 tablets by mouth daily.   Yes [provider]  gabapentin (NEURONTIN) 800 MG tablet Take 800 mg by mouth at bedtime as needed (for nerve pain).   Yes [provider]  hydrOXYzine (ATARAX) 25 MG tablet Take 1 tablet (25 mg total) by mouth every 6 (six) hours. 08/15/21  Yes Tacy Learn, PA-C  oxyCODONE-acetaminophen (PERCOCET/ROXICET) 5-325 MG tablet Take by mouth 4 (four) times daily as needed for severe pain.   Yes [provider]  phentermine (ADIPEX-P) 37.5 MG tablet Take 37.5 mg by mouth daily  before breakfast.   Yes [provider]  Polyethylene Glycol 3350 (MIRALAX PO) Take 17 g by mouth daily as needed (for constipation).   Yes [provider]  pravastatin (PRAVACHOL) 80 MG tablet Take 80 mg by mouth at bedtime. 12/04/20  Yes [provider]  Semaglutide, 1 MG/DOSE, (OZEMPIC, 1 MG/DOSE,) 2 MG/1.5ML SOPN Inject 1 mg into the skin every Friday.   Yes [provider]  topiramate (TOPAMAX) 100 MG tablet Take 100 mg by mouth at bedtime. 12/08/20  Yes [provider]      Allergies    Patient has no allergy information on record.    Review of Systems   Review of Systems Negative except as per HPI Physical Exam Updated Vital Signs BP 128/79   Pulse 66   Temp 97.6 F (36.4 C) (Oral)   Resp 18   Ht 5' 2.5" (1.588 m)   Wt 89.8 kg   LMP 12/08/2011   SpO2 99%   BMI 35.64 kg/m  Physical Exam Vitals and nursing note reviewed.  Constitutional:      General: She is not in acute distress.    Appearance: She is well-developed. She is not diaphoretic.  HENT:     Head: Normocephalic and atraumatic.  Cardiovascular:     Rate and Rhythm: Normal rate and regular rhythm.  Pulmonary:     Effort: Pulmonary effort is normal.     Breath sounds: Normal  breath sounds.  Skin:    Comments: 1 minor contusion noted to patient's right inner lower thigh otherwise skin exam is unremarkable.  Neurological:     Mental Status: She is alert and oriented to person, place, and time.     GCS: GCS eye subscore is 4. GCS verbal subscore is 5. GCS motor subscore is 6.     Cranial Nerves: No cranial nerve deficit.  Psychiatric:        Behavior: Behavior normal.     ED Results / Procedures / Treatments   Labs (all labs ordered are listed, but only abnormal results are displayed) Labs Reviewed  BASIC METABOLIC PANEL - Abnormal; Notable for the following components:      Result Value   Potassium 3.4 (*)    CO2 20 (*)    Glucose, Bld 108 (*)    All  other components within normal limits  SARS CORONAVIRUS 2 BY RT PCR  CBC WITH DIFFERENTIAL/PLATELET  PROTIME-INR    EKG None  Radiology CT Head Wo Contrast  Result Date: 08/15/2021 CLINICAL DATA:  61 year old female with history of headache, blurred vision and nausea for 1 week. EXAM: CT HEAD WITHOUT CONTRAST TECHNIQUE: Contiguous axial images were obtained from the base of the skull through the vertex without intravenous contrast. RADIATION DOSE REDUCTION: This exam was performed according to the departmental dose-optimization program which includes automated exposure control, adjustment of the mA and/or kV according to patient size and/or use of iterative reconstruction technique. COMPARISON:  Head CT 10/04/2020. FINDINGS: Brain: No evidence of acute infarction, hemorrhage, hydrocephalus, extra-axial collection or mass lesion/mass effect. Vascular: No hyperdense vessel or unexpected calcification. Skull: Normal. Negative for fracture or focal lesion. Sinuses/Orbits: No acute finding. Other: None. IMPRESSION: 1. No acute intracranial abnormalities. The appearance of the brain is normal. Electronically Signed   By: Vinnie Langton M.D.   On: 08/15/2021 06:00    Procedures Procedures    Medications Ordered in ED Medications  oxyCODONE-acetaminophen (PERCOCET/ROXICET) 5-325 MG per tablet 1 tablet (1 tablet Oral Given 08/15/21 1517)    ED Course/ Medical Decision Making/ A&P                           Medical Decision Making Amount and/or Complexity of Data Reviewed Labs: ordered. Radiology: ordered.  Risk Prescription drug management.   This patient presents to the ED for concern of headache, rash/itching, this involves an extensive number of treatment options, and is a complaint that carries with it a high risk of complications and morbidity.  The differential diagnosis includes COVID, thrombocytopenia, migraine, hyperbilirubinemia, scabies, tickborne illness   Co morbidities that  complicate the patient evaluation  Migraines    Additional history obtained:  External records from outside source obtained and reviewed including prior CT head from 10/04/2020 negative noncontrast CT of brain   Lab Tests:  I Ordered, and personally interpreted labs.  The pertinent results include: CBC, BMP, COVID and INR unremarkable   Imaging Studies ordered:  I ordered imaging studies including CT head  I independently visualized and interpreted imaging which showed no acute abnormality  I agree with the radiologist interpretation   Problem List / ED Course / Critical interventions / Medication management  61 year old female presents with complaint of diffuse bodyaches with itching and a headache as above.  Her exam is reassuring, other than a bruise on her thigh which appears old and she does not have any acute skin findings.  Lab  work is reassuring, COVID-negative, head CT unremarkable.  Patient is given Percocet for her pain.  Given prescription for hydroxyzine for her itching.  Question if the purple spots she is seeing is bruising popping up secondary to her intense itching.  Recommend follow-up with her primary care provider. I ordered medication including Percocet for headache Reevaluation of the patient after these medicines showed that the patient improved I have reviewed the patients home medicines and have made adjustments as needed   Social Determinants of Health:  Has PCP   Test / Admission - Considered:  Has admission however given normal labs and unremarkable exam, patient is felt stable for follow-up with primary care provider.         Final Clinical Impression(s) / ED Diagnoses Final diagnoses:  Nonintractable episodic headache, unspecified headache type  Pruritus    Rx / DC Orders ED Discharge Orders          Ordered    hydrOXYzine (ATARAX) 25 MG tablet  Every 6 hours        08/15/21 0610              Tacy Learn,  PA-C 08/15/21 Horatio, April, MD 08/15/21 [phone]

## 2021-08-15 NOTE — Discharge Instructions (Signed)
Hydroxyzine as needed as prescribed for itching. Recheck with your primary care provider. Home to rest and hydrate.  Your work up today is reassuring, including a negative COVID test. Your head CT is normal.

## 2021-08-15 NOTE — ED Notes (Signed)
Unsuccessful [place] PIV x2 in L and R AC.

## 2021-08-15 NOTE — Telephone Encounter (Signed)
Patient returns to ED where she was seen earlier today for pruritus, headache.  The patient was prescribed hydroxyzine however she is unable to pick this medication up at the pharmacy.  There has been for more than 2 hours since she was seen, unable to send this medication electronically.  I will fill it and print out for her.

## 2021-10-19 ENCOUNTER — Telehealth: Payer: Self-pay | Admitting: *Deleted

## 2021-10-19 NOTE — Telephone Encounter (Signed)
Spoke with patient.  Patient reports intermittent spotting since LEEP 06/11/21 with Dr. Talbert Nan.  States she is sexually active, noticing increase in spotting with intercourse.  Denies vaginal odor, discharge, pelvic pain, heavy bleeding or urinary symptoms.  OV scheduled with covering provider, Dr. Quincy Simmonds, on 10/17 at 2:30pm. Advised patient I will forward for her to review and return call if any additional recommendations. Patient agreeable.   Routing to Dr. Quincy Simmonds.   Routing to provider for final review. Patient is agreeable to disposition. Will close encounter.

## 2021-10-19 NOTE — Telephone Encounter (Signed)
Call returned to patient. Left message to call [facility] Triage at [phone], OPT 4.  Patient left voicemail requesting OV for vaginal bleeding.   Per review of Epic, last OV 06/20/21 with JJ.  Postmenopausal

## 2021-10-23 ENCOUNTER — Other Ambulatory Visit (HOSPITAL_COMMUNITY)
Admission: RE | Admit: 2021-10-23 | Discharge: 2021-10-23 | Disposition: A | Discharge status: Home / Self Care | Payer: Medicare Other | Source: Ambulatory Visit | Attending: Obstetrics and Gynecology | Admitting: Obstetrics and Gynecology

## 2021-10-23 ENCOUNTER — Ambulatory Visit (INDEPENDENT_AMBULATORY_CARE_PROVIDER_SITE_OTHER): Payer: Medicare Other | Admitting: Obstetrics and Gynecology

## 2021-10-23 ENCOUNTER — Encounter: Payer: Self-pay | Admitting: Obstetrics and Gynecology

## 2021-10-23 VITALS — BP 124/72 | HR 68

## 2021-10-23 DIAGNOSIS — N952 Postmenopausal atrophic vaginitis: Secondary | ICD-10-CM | POA: Diagnosis not present

## 2021-10-23 DIAGNOSIS — Z01419 Encounter for gynecological examination (general) (routine) without abnormal findings: Secondary | ICD-10-CM | POA: Insufficient documentation

## 2021-10-23 DIAGNOSIS — N939 Abnormal uterine and vaginal bleeding, unspecified: Secondary | ICD-10-CM

## 2021-10-23 DIAGNOSIS — Z1151 Encounter for screening for human papillomavirus (HPV): Secondary | ICD-10-CM | POA: Diagnosis not present

## 2021-10-23 DIAGNOSIS — Z87410 Personal history of cervical dysplasia: Secondary | ICD-10-CM | POA: Diagnosis not present

## 2021-10-23 DIAGNOSIS — N898 Other specified noninflammatory disorders of vagina: Secondary | ICD-10-CM | POA: Diagnosis not present

## 2021-10-23 DIAGNOSIS — Z9889 Other specified postprocedural states: Secondary | ICD-10-CM

## 2021-10-23 NOTE — Progress Notes (Signed)
GYNECOLOGY  VISIT   HPI: 61 y.o.   Legally Separated  African American  female   No obstetric history on file. with Patient's last menstrual period was 12/08/2011.   here for PMB Patient reports spotting since LEEP 06/11/21. Thinks she did not stop bleeding since her LEEP procedure.   Final pathology from the LEEP was negative.  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   Started having sex in May right before surgery.  Had bleeding after sex 2 - 3 weeks ago.  No pain with sex.  This is not a new partner.   Does have some cramping, which she attributes to Ozempic.   She had negative gonorrhea, chlamydia, and trichomonas.   She did a self swab in her PCP's office one month ago, and it was negative for yeast.  The swab caused bleeding to occur.   Not doing HRT or vaginal estrogen cream.   No itching or odor.  GYNECOLOGIC HISTORY: Patient's last menstrual period was 12/08/2011. Contraception:  PMP Menopausal hormone therapy:  none  Last mammogram:  10/01/2018 density B Bi-rads 1 neg Last pap smear:   LGSIL and positive HR HPV.         OB History   No obstetric history on file.        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   Arthritis 10/23/2016   OBESITY 05/10/2009   ANXIETY 05/10/2009   MIGRAINE HEADACHE 04/15/2009   PNEUMONIA, COMMUNITY ACQUIRED, PNEUMOCOCCAL 04/15/2009   ASTHMA 04/15/2009    Past Medical History:  Diagnosis Date   Anxiety    Arthritis    back and knee   Cervical dysplasia    History of Bell's palsy 2015   right side, per pt resolved   Migraines    Wears glasses     Past Surgical History:  Procedure Laterality Date   CESAREAN SECTION     HYSTEROSCOPY WITH D & C  07/28/2002   '@[facility]'$    LEEP N/A 06/11/2021   Procedure: LOOP ELECTROSURGICAL EXCISION PROCEDURE (LEEP) with colposcopy;  Surgeon: Salvadore Dom, MD;  Location: [facility];  Service: Gynecology;  Laterality: N/A;  Wants the laser to attach to the colposcope   TOTAL KNEE ARTHROPLASTY Right 05/18/2019   Procedure: TOTAL KNEE ARTHROPLASTY;  Surgeon: Paralee Cancel, MD;  Location: [facility];  Service: Orthopedics;  Laterality: Right;  29mns    Current Outpatient Medications  Medication Sig Dispense Refill   acetaminophen (TYLENOL) 500 MG tablet Take 500 mg by mouth every 6 (six) hours as needed for moderate pain.     albuterol (VENTOLIN HFA) 108 (90 Base) MCG/ACT inhaler Inhale 1-2 puffs into the lungs every 6 (six) hours as needed for wheezing or shortness of breath.     BELSOMRA 20 MG TABS Take 20 mg by mouth at bedtime as needed (for sleep).     Cholecalciferol (VITAMIN D3 GUMMIES PO) Take 2 tablets by mouth daily.     gabapentin (NEURONTIN) 800 MG tablet Take 800 mg by mouth at bedtime as needed (for nerve pain).     hydrOXYzine (ATARAX) 25 MG tablet Take 1 tablet (25 mg total) by mouth every 6 (six) hours. 12 tablet 0   hydrOXYzine (ATARAX) 25 MG tablet Take 1 tablet (25 mg total) by mouth every 6 (six) hours. 12 tablet 0   oxyCODONE-acetaminophen (PERCOCET/ROXICET) 5-325 MG tablet Take by mouth 4 (  four) times daily as needed for severe pain.     phentermine (ADIPEX-P) 37.5 MG tablet Take 37.5 mg by mouth daily before breakfast.     Polyethylene Glycol 3350 (MIRALAX PO) Take 17 g by mouth daily as needed (for constipation).     pravastatin (PRAVACHOL) 80 MG tablet Take 80 mg by mouth at bedtime.     Semaglutide, 1 MG/DOSE, (OZEMPIC, 1 MG/DOSE,) 2 MG/1.5ML SOPN Inject 1 mg into the skin every Friday.     topiramate (TOPAMAX) 100 MG tablet Take 100 mg by mouth at bedtime.     No current facility-administered medications for this visit.     ALLERGIES: Patient has no allergy information on record.  Family History  Problem Relation Age of Onset   Hypertension Other    Diabetes Other     Social History   Socioeconomic History   Marital status: Legally  Separated    Spouse name: Not on file   Number of children: Not on file   Years of education: Not on file   Highest education level: Not on file  Occupational History   Not on file  Tobacco Use   Smoking status: Never   Smokeless tobacco: Never  Vaping Use   Vaping Use: Never used  Substance and Sexual Activity   Alcohol use: Not Currently    Comment: occasional   Drug use: No   Sexual activity: Yes    Birth control/protection: Post-menopausal  Other Topics Concern   Not on file  Social History Narrative   Not on file   Social Determinants of Health   Financial Resource Strain: Not on file  Food Insecurity: Not on file  Transportation Needs: Not on file  Physical Activity: Not on file  Stress: Not on file  Social Connections: Not on file  Intimate Partner Violence: Not on file    Review of Systems  Genitourinary:  Positive for vaginal bleeding.  All other systems reviewed and are negative.   PHYSICAL EXAMINATION:    BP 124/72   Pulse 68   LMP 12/08/2011   SpO2 100% ,  General appearance: alert, cooperative and appears stated age  Pelvic: External genitalia:  no lesions              Urethra:  normal appearing urethra with no masses, tenderness or lesions              Bartholins and Skenes: normal                 Vagina: normal appearing vagina with normal color and discharge, no lesions              Cervix: appears almost flush with cervix.  Erythema of the cervix is noted.  There is also an enlongated white raise area at 9:00 - 10:00 lateral to the cervix.   Atrophy changes noted and tissue bleeds with contract with a speculum and with pap brush use.                Bimanual Exam:  Uterus:  normal size, contour, position, consistency, mobility, non-tender              Adnexa: no mass, fullness, tenderness            Chaperone was present for exam:  Eustace Pen, CMA  ASSESSMENT  Vaginal bleeding.  Status post LEEP.  Hx cervical dysplasia.  Possible vaginal  dysplasia noted today.  Vaginal atrophy.   PLAN  Pap and HR HPV  collected.  Will get a copy of her mammogram report from [place] that she has done in the last year.  When mammogram back and normal, will then prescribe vaginal estrogen, estrace 1/2 gm pv at hs for 2 weeks and then twice weekly.  I recommend a colposcopy in 4 weeks with Dr. Talbert Nan.    An After Visit Summary was printed and given to the patient.  32 min  total time was spent for this patient encounter, including preparation, face-to-face counseling with the patient, coordination of care, and documentation of the encounter.

## 2021-10-24 ENCOUNTER — Other Ambulatory Visit: Payer: Self-pay | Admitting: *Deleted

## 2021-10-24 DIAGNOSIS — Z87410 Personal history of cervical dysplasia: Secondary | ICD-10-CM

## 2021-10-29 ENCOUNTER — Other Ambulatory Visit: Payer: Self-pay

## 2021-10-29 LAB — CYTOLOGY - PAP
Comment: NEGATIVE
Diagnosis: NEGATIVE
High risk HPV: NEGATIVE

## 2021-10-29 MED ORDER — ESTRADIOL 0.1 MG/GM VA CREA
TOPICAL_CREAM | VAGINAL | 0 refills | Status: DC
Start: 2021-10-29 — End: 2022-01-29

## 2021-11-05 ENCOUNTER — Ambulatory Visit: Payer: Medicare Other | Admitting: Obstetrics and Gynecology

## 2021-11-09 ENCOUNTER — Encounter: Payer: Self-pay | Admitting: Obstetrics and Gynecology

## 2021-11-09 ENCOUNTER — Ambulatory Visit (INDEPENDENT_AMBULATORY_CARE_PROVIDER_SITE_OTHER): Payer: Medicare Other | Admitting: Obstetrics and Gynecology

## 2021-11-09 VITALS — BP 110/62 | HR 82

## 2021-11-09 DIAGNOSIS — Z87410 Personal history of cervical dysplasia: Secondary | ICD-10-CM | POA: Diagnosis not present

## 2021-11-09 DIAGNOSIS — N888 Other specified noninflammatory disorders of cervix uteri: Secondary | ICD-10-CM

## 2021-11-09 DIAGNOSIS — N952 Postmenopausal atrophic vaginitis: Secondary | ICD-10-CM

## 2021-11-09 NOTE — Patient Instructions (Signed)
F/u in 2 weeks  Use one gram of estrace cream every night for one week, then every other night for one week.

## 2021-11-09 NOTE — Progress Notes (Signed)
GYNECOLOGY  VISIT   HPI: 61 y.o.   Legally Separated Black or African American Not Hispanic or Latino  female   No obstetric history on file. with Patient's last menstrual period was 12/08/2011.   here for colposcopy.    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 Colposcopy: not satisfactory. Loop cone from 06/11/21 was negative for dysplasia, ECC non diagnostic, no tissue recovered.   She was seen by Dr Quincy Simmonds in 10/23/21 with c/o spotting since her leep in 6/23. Exam showed: Cervix: appears almost flush with cervix.  Erythema of the cervix is noted.  There is also an enlongated white raise area at 9:00 - 10:00 lateral to the cervix.   Atrophy changes noted and tissue bleeds with contract with a speculum and with pap brush use.   10/23/21 pap was normal with negative hpv. On 10/29/21 the patient was started on vaginal estrogen.   GYNECOLOGIC HISTORY: Patient's last menstrual period was 12/08/2011. Contraception:pmp  Menopausal hormone therapy: estrace         OB History   No obstetric history on file.        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   Arthritis 10/23/2016   OBESITY 05/10/2009   ANXIETY 05/10/2009   MIGRAINE HEADACHE 04/15/2009   PNEUMONIA, COMMUNITY ACQUIRED, PNEUMOCOCCAL 04/15/2009   ASTHMA 04/15/2009    Past Medical History:  Diagnosis Date   Anxiety    Arthritis    back and knee   Cervical dysplasia    History of Bell's palsy 2015   right side, per pt resolved   Migraines    Wears glasses     Past Surgical History:  Procedure Laterality Date   CESAREAN SECTION     HYSTEROSCOPY WITH D & C  07/28/2002   '@[facility]    LEEP N/A 06/11/2021   Procedure: LOOP ELECTROSURGICAL EXCISION PROCEDURE (LEEP) with colposcopy;  Surgeon: Salvadore Dom, MD;  Location: [facility];  Service: Gynecology;  Laterality: N/A;  Wants the laser to attach to the colposcope    TOTAL KNEE ARTHROPLASTY Right 05/18/2019   Procedure: TOTAL KNEE ARTHROPLASTY;  Surgeon: Paralee Cancel, MD;  Location: [facility];  Service: Orthopedics;  Laterality: Right;  54mns    Current Outpatient Medications  Medication Sig Dispense Refill   acetaminophen (TYLENOL) 500 MG tablet Take 500 mg by mouth every 6 (six) hours as needed for moderate pain.     albuterol (VENTOLIN HFA) 108 (90 Base) MCG/ACT inhaler Inhale 1-2 puffs into the lungs every 6 (six) hours as needed for wheezing or shortness of breath.     BELSOMRA 20 MG TABS Take 20 mg by mouth at bedtime as needed (for sleep).     Cholecalciferol (VITAMIN D3 GUMMIES PO) Take 2 tablets by mouth daily.     estradiol (ESTRACE VAGINAL) 0.1 MG/GM vaginal cream Insert one gram vaginally qhs x 1 week, then insert 1 gram vaginally 2 x a week at hs. 42.5 g 0   gabapentin (NEURONTIN) 800 MG tablet Take 800 mg by mouth at bedtime as needed (for nerve pain).     hydrOXYzine (ATARAX) 25 MG tablet Take 1 tablet (25 mg total) by mouth every 6 (six) hours. 12 tablet 0   hydrOXYzine (ATARAX) 25 MG tablet Take 1 tablet (25 mg total) by mouth every 6 (six) hours. 12 tablet 0   oxyCODONE-acetaminophen (PERCOCET/ROXICET) 5-325 MG tablet Take  by mouth 4 (four) times daily as needed for severe pain.     Polyethylene Glycol 3350 (MIRALAX PO) Take 17 g by mouth daily as needed (for constipation).     pravastatin (PRAVACHOL) 80 MG tablet Take 80 mg by mouth at bedtime.     topiramate (TOPAMAX) 100 MG tablet Take 100 mg by mouth at bedtime.     phentermine (ADIPEX-P) 37.5 MG tablet Take 37.5 mg by mouth daily before breakfast. (Patient not taking: Reported on 11/09/2021)     Semaglutide, 1 MG/DOSE, (OZEMPIC, 1 MG/DOSE,) 2 MG/1.5ML SOPN Inject 1 mg into the skin every Friday. (Patient not taking: Reported on 11/09/2021)     No current facility-administered medications for this visit.     ALLERGIES: Patient has no allergy information on record.  Family History   Problem Relation Age of Onset   Hypertension Other    Diabetes Other     Social History   Socioeconomic History   Marital status: Legally Separated    Spouse name: Not on file   Number of children: Not on file   Years of education: Not on file   Highest education level: Not on file  Occupational History   Not on file  Tobacco Use   Smoking status: Never   Smokeless tobacco: Never  Vaping Use   Vaping Use: Never used  Substance and Sexual Activity   Alcohol use: Not Currently    Comment: occasional   Drug use: No   Sexual activity: Yes    Birth control/protection: Post-menopausal  Other Topics Concern   Not on file  Social History Narrative   Not on file   Social Determinants of Health   Financial Resource Strain: Not on file  Food Insecurity: Not on file  Transportation Needs: Not on file  Physical Activity: Not on file  Stress: Not on file  Social Connections: Not on file  Intimate Partner Violence: Not on file    Review of Systems  All other systems reviewed and are negative.   PHYSICAL EXAMINATION:    BP 110/62   Pulse 82   LMP 12/08/2011   SpO2 100%     General appearance: alert, cooperative and appears stated age  Pelvic: External genitalia:  no lesions              Urethra:  normal appearing urethra with no masses, tenderness or lesions              Bartholins and Skenes: normal                 Vagina: mildly atrophic appearing vagina with normal color and discharge, no lesions              Cervix:  flush with vagina, friable, no clear os seen  Colposcopy: no aceto-white changes, the prior vaginal change was not seen on exam. Unable to clearly identify the os. Not able to do an ECC.  Friable cervix treated with silver nitrate.                 Chaperone was present for exam.  1. History of cervical dysplasia S/P loop cone in 6/23 was negative for dysplasia, ECC was non diagnostic. Several weeks ago Dr Quincy Simmonds saw a raised area in the patient's  vagina, with use of vaginal estrogen that area has resolved. Pap was negative with negative hpv.  - Colposcopy, no acetowhite changes, no vaginal lesions, cervix is flush with the vagina, os not clearly identified.  -  Needs f/u pap in 4/24 -Negative STD testing in the spring, she has the same partner, declines testing  2. Friable cervix Treated with silver nitrate Will continue with vaginal estrogen, 1 gram vaginally qhs x 1 week, then change to qod F/U in 2 weeks  3. Vaginal atrophy Vaginal estrogen as above.

## 2021-11-21 ENCOUNTER — Ambulatory Visit: Payer: Medicare Other | Admitting: Obstetrics and Gynecology

## 2021-11-22 ENCOUNTER — Encounter: Payer: Self-pay | Admitting: Obstetrics and Gynecology

## 2021-11-22 ENCOUNTER — Ambulatory Visit (INDEPENDENT_AMBULATORY_CARE_PROVIDER_SITE_OTHER): Payer: Medicare Other | Admitting: Obstetrics and Gynecology

## 2021-11-22 VITALS — BP 120/78 | HR 66

## 2021-11-22 DIAGNOSIS — N952 Postmenopausal atrophic vaginitis: Secondary | ICD-10-CM

## 2021-11-22 DIAGNOSIS — N888 Other specified noninflammatory disorders of cervix uteri: Secondary | ICD-10-CM | POA: Diagnosis not present

## 2021-11-22 DIAGNOSIS — Z87410 Personal history of cervical dysplasia: Secondary | ICD-10-CM

## 2021-11-22 NOTE — Progress Notes (Signed)
GYNECOLOGY  VISIT   HPI: 61 y.o.   Legally Separated Black or African American Not Hispanic or Latino  female   No obstetric history on file. with Patient's last menstrual period was 12/08/2011.   here for  a follow up on bleeding. She has had a friable cervix since her loop cone in 6/23. Last month she was started on vaginal estrogen. 2 weeks ago her cervix was treated with silver nitrate.  No bleeding since her last visit, but she hasn't been sexually active.   GYNECOLOGIC HISTORY: Patient's last menstrual period was 12/08/2011. Contraception:pmp  Menopausal hormone therapy: estrace cream         OB History   No obstetric history on file.        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   Arthritis 10/23/2016   OBESITY 05/10/2009   ANXIETY 05/10/2009   MIGRAINE HEADACHE 04/15/2009   PNEUMONIA, COMMUNITY ACQUIRED, PNEUMOCOCCAL 04/15/2009   ASTHMA 04/15/2009    Past Medical History:  Diagnosis Date   Anxiety    Arthritis    back and knee   Cervical dysplasia    History of Bell's palsy 2015   right side, per pt resolved   Migraines    Wears glasses     Past Surgical History:  Procedure Laterality Date   CESAREAN SECTION     HYSTEROSCOPY WITH D & C  07/28/2002   '@[facility]'$    LEEP N/A 06/11/2021   Procedure: LOOP ELECTROSURGICAL EXCISION PROCEDURE (LEEP) with colposcopy;  Surgeon: Salvadore Dom, MD;  Location: [facility];  Service: Gynecology;  Laterality: N/A;  Wants the laser to attach to the colposcope   TOTAL KNEE ARTHROPLASTY Right 05/18/2019   Procedure: TOTAL KNEE ARTHROPLASTY;  Surgeon: Paralee Cancel, MD;  Location: [facility];  Service: Orthopedics;  Laterality: Right;  87mns    Current Outpatient Medications  Medication Sig Dispense Refill   acetaminophen (TYLENOL) 500 MG tablet Take 500 mg by mouth every 6 (six) hours as needed for moderate pain.     albuterol (VENTOLIN HFA) 108 (90  Base) MCG/ACT inhaler Inhale 1-2 puffs into the lungs every 6 (six) hours as needed for wheezing or shortness of breath.     BELSOMRA 20 MG TABS Take 20 mg by mouth at bedtime as needed (for sleep).     Cholecalciferol (VITAMIN D3 GUMMIES PO) Take 2 tablets by mouth daily.     estradiol (ESTRACE VAGINAL) 0.1 MG/GM vaginal cream Insert one gram vaginally qhs x 1 week, then insert 1 gram vaginally 2 x a week at hs. 42.5 g 0   gabapentin (NEURONTIN) 800 MG tablet Take 800 mg by mouth at bedtime as needed (for nerve pain).     hydrOXYzine (ATARAX) 25 MG tablet Take 1 tablet (25 mg total) by mouth every 6 (six) hours. 12 tablet 0   hydrOXYzine (ATARAX) 25 MG tablet Take 1 tablet (25 mg total) by mouth every 6 (six) hours. 12 tablet 0   oxyCODONE-acetaminophen (PERCOCET/ROXICET) 5-325 MG tablet Take by mouth 4 (four) times daily as needed for severe pain.     phentermine (ADIPEX-P) 37.5 MG tablet Take 37.5 mg by mouth daily before breakfast.     Polyethylene Glycol 3350 (MIRALAX PO) Take 17 g by mouth daily as needed (for constipation).     pravastatin (PRAVACHOL) 80 MG tablet Take 80 mg by mouth at bedtime.     Semaglutide, 1 MG/DOSE, (  OZEMPIC, 1 MG/DOSE,) 2 MG/1.5ML SOPN Inject 1 mg into the skin every Friday.     topiramate (TOPAMAX) 100 MG tablet Take 100 mg by mouth at bedtime.     No current facility-administered medications for this visit.     ALLERGIES: Patient has no allergy information on record.  Family History  Problem Relation Age of Onset   Hypertension Other    Diabetes Other     Social History   Socioeconomic History   Marital status: Legally Separated    Spouse name: Not on file   Number of children: Not on file   Years of education: Not on file   Highest education level: Not on file  Occupational History   Not on file  Tobacco Use   Smoking status: Never   Smokeless tobacco: Never  Vaping Use   Vaping Use: Never used  Substance and Sexual Activity   Alcohol use:  Not Currently    Comment: occasional   Drug use: No   Sexual activity: Yes    Birth control/protection: Post-menopausal  Other Topics Concern   Not on file  Social History Narrative   Not on file   Social Determinants of Health   Financial Resource Strain: Not on file  Food Insecurity: Not on file  Transportation Needs: Not on file  Physical Activity: Not on file  Stress: Not on file  Social Connections: Not on file  Intimate Partner Violence: Not on file    ROS  PHYSICAL EXAMINATION:    BP 120/78   Pulse 66   LMP 12/08/2011   SpO2 100%     General appearance: alert, cooperative and appears stated age   Pelvic: External genitalia:  no lesions              Urethra:  normal appearing urethra with no masses, tenderness or lesions              Bartholins and Skenes: normal                 Vagina: normal appearing vagina with normal color and discharge, no lesions              Cervix: no lesions and flush with her vagina, appears slightly friable, but improved. Ext os not clearly identified. No bleeding with gentle palpation with a swab. Cervix treated with silver nitrate.                 Chaperone was present for exam.  1. History of cervical dysplasia S/p loop cone in 6/23 was negative for dysplasia, ECC was non diagnostic.  Negate pap with negative hpv testing last month (TZ present) -f/u pap in 4/24  2. Friable cervix Improving with vaginal estrogen, treated with silver nitrate Continue vaginal estrogen 2 x a week Call with continued issues  3. Vaginal atrophy Improved with vaginal estrogen, will continue.

## 2021-12-19 IMAGING — CT CT CERVICAL SPINE W/O CM
3 of 4 series · 12 of 33 positions shown, 14 images · non-contrast
Comparison: CT brain 12/22/1998

Radiograph 12/17/2013

CLINICAL DATA: Trauma

EXAM:
CT HEAD WITHOUT CONTRAST
CT CERVICAL SPINE WITHOUT CONTRAST
TECHNIQUE: Multidetector CT imaging of the head and cervical spine was
performed following the standard protocol without intravenous
contrast. Multiplanar CT image reconstructions of the cervical spine
were also generated.

[Series 5: orthogonal bone · axial · 0.27mm/px · z∈[-388,-291]mm · 4 of 82 slices shown, 5 images]
[im 14/82  soft-tissue]
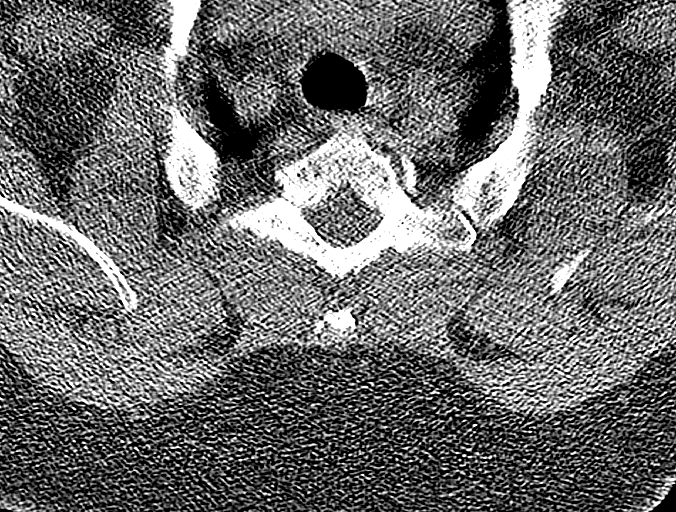
[im 14/82  bone]
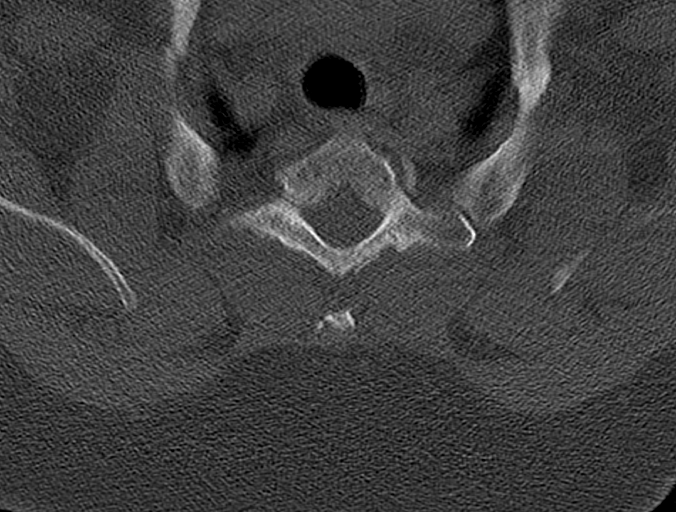
[im 28/82  bone]
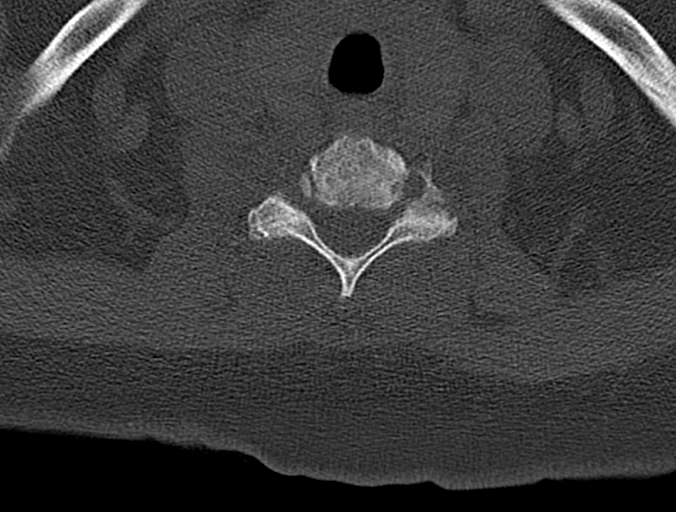
[im 55/82  bone]
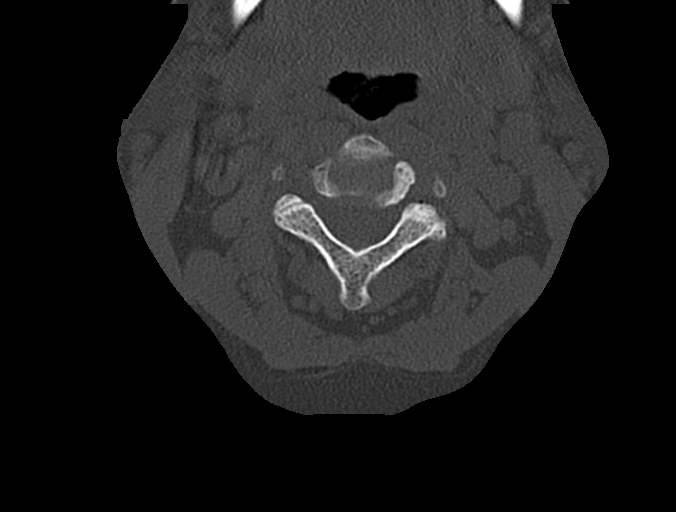
[im 68/82  bone]
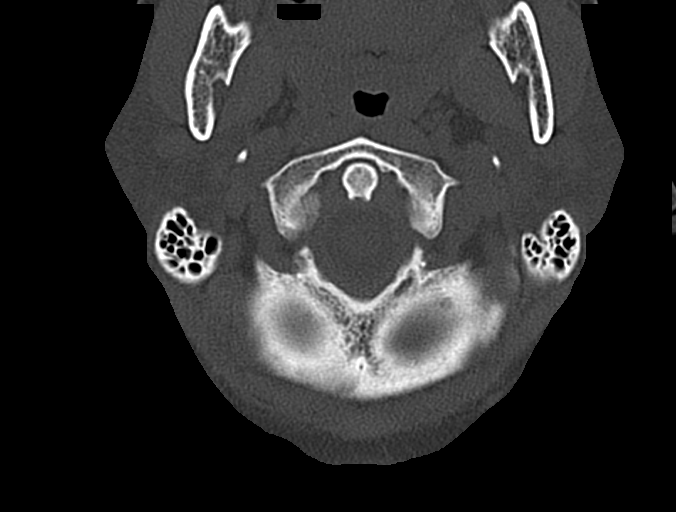

[Series 6: coronal bone · coronal · 0.23mm/px · 3 of 46 slices shown]
[im 10/46  bone]
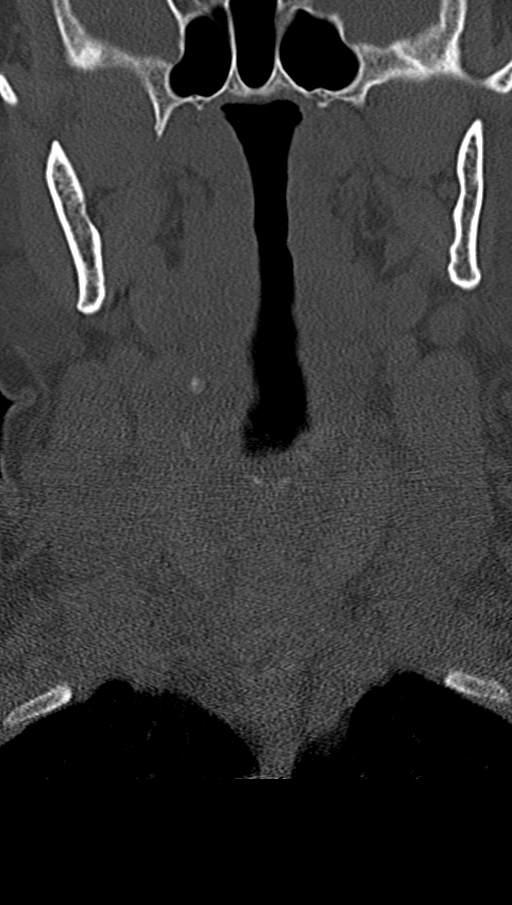
[im 19/46  bone]
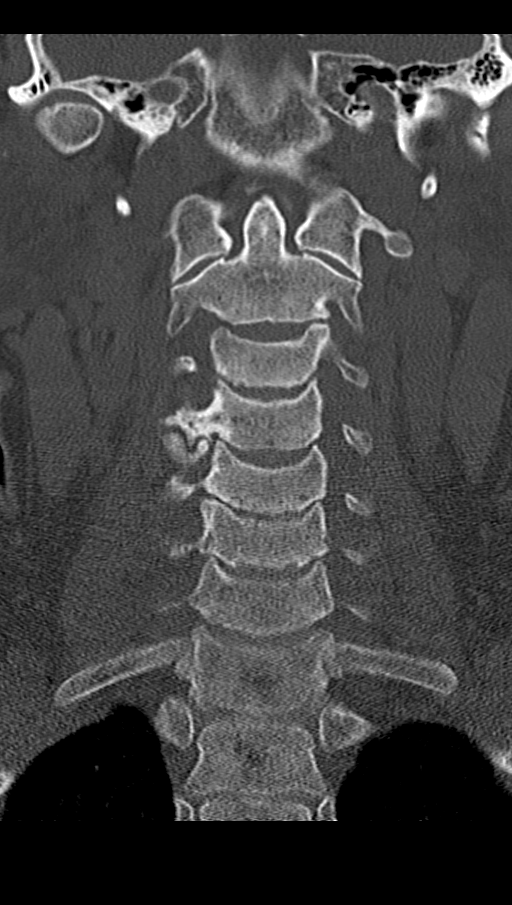
[im 28/46  bone]
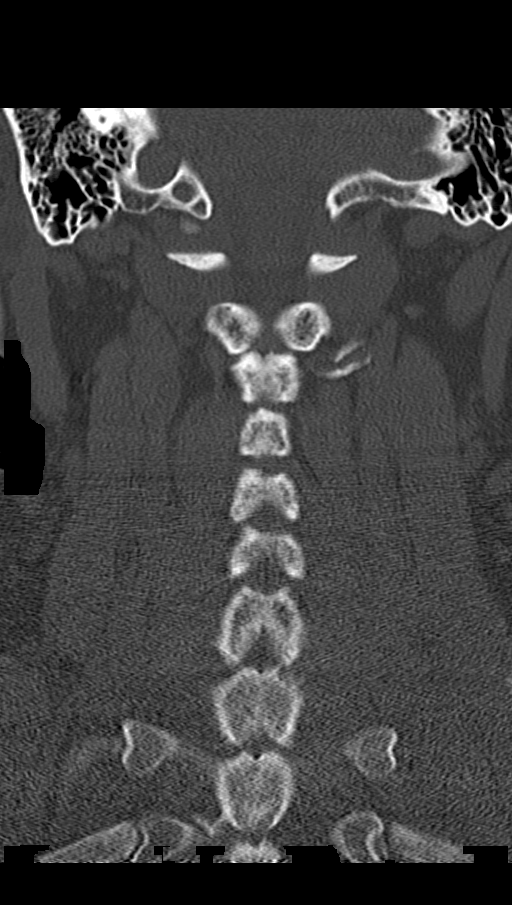

[Series 7: sagittal bone · sagittal · 0.34mm/px · 5 of 52 slices shown, 6 images]
[im 18/52  bone]
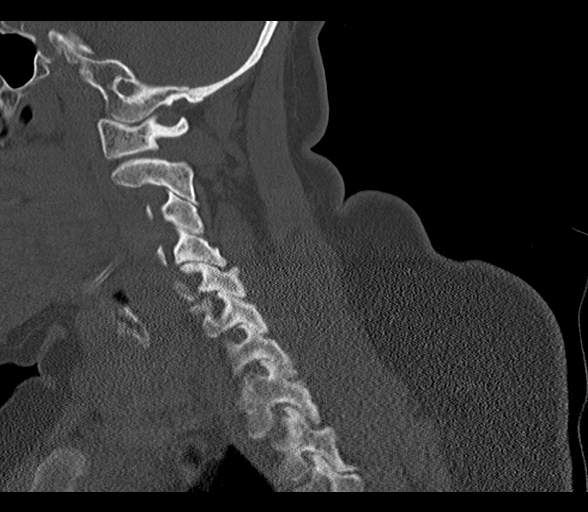
[im 22/52  bone]
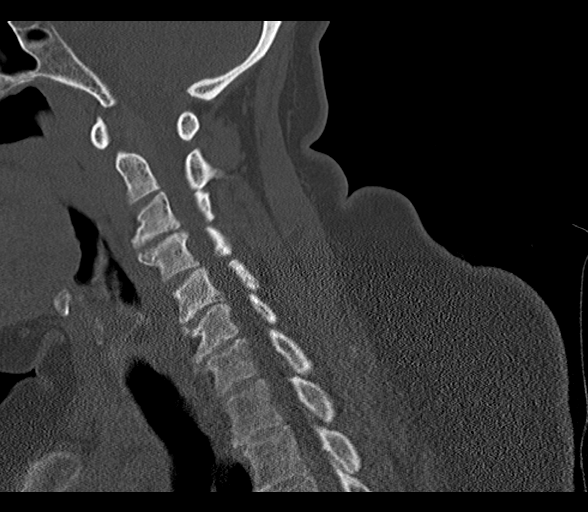
[im 26/52  soft-tissue]
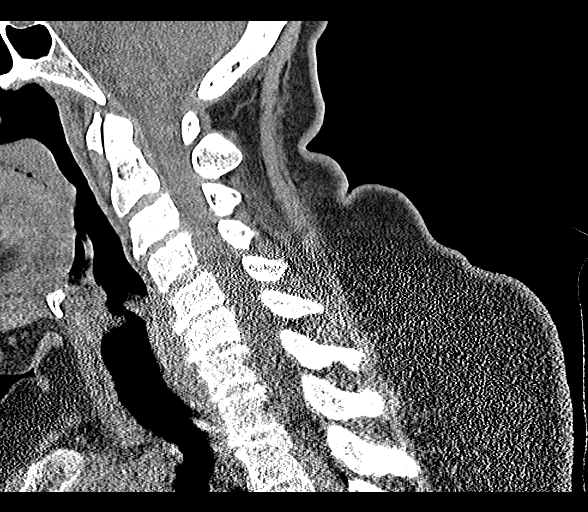
[im 26/52  bone]
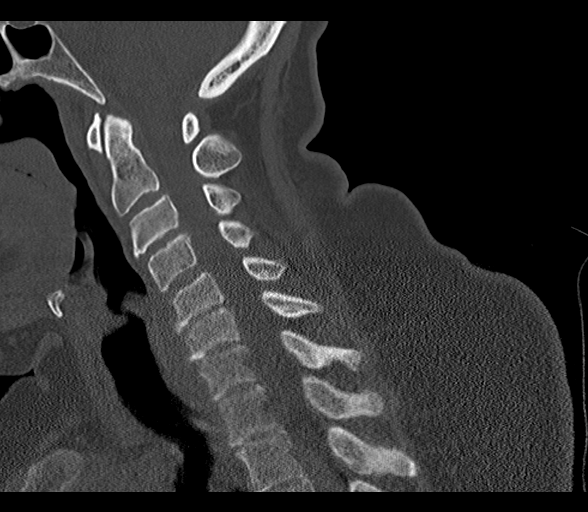
[im 30/52  bone]
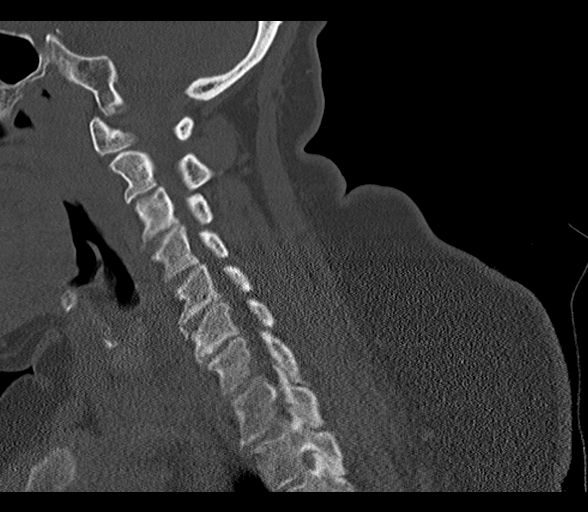
[im 35/52  bone]
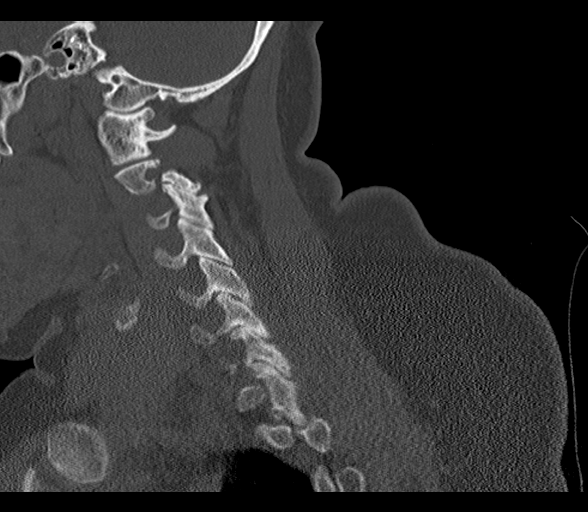

[12 of 33 positions shown; findings below may reference images not displayed]

FINDINGS: CT HEAD FINDINGS

Brain: No evidence of acute infarction, hemorrhage, hydrocephalus,
extra-axial collection or mass lesion/mass effect.

Vascular: No hyperdense vessel or unexpected calcification.

Skull: Normal. Negative for fracture or focal lesion.

Sinuses/Orbits: No acute finding.

Other: None

CT CERVICAL SPINE FINDINGS

Alignment: No subluxation.  Facet alignment within normal limits.

Skull base and vertebrae: No acute fracture. No primary bone lesion
or focal pathologic process.

Soft tissues and spinal canal: No prevertebral fluid or swelling. No
visible canal hematoma.

Disc levels: Multilevel degenerative change. Mild disc space
narrowing at C3-C4. Moderate degenerative changes C5-C6, C6-C7 and
C7-T1. Facet degenerative changes at levels with foraminal stenosis

Upper chest: Negative.

Other: None
IMPRESSION: 1. Negative non contrasted CT appearance of the brain.
2. Degenerative changes of the cervical spine. No acute osseous
abnormality

## 2021-12-19 IMAGING — CT CT HEAD W/O CM
3 series · 15 of 45 positions shown, 18 images · non-contrast
Comparison: CT brain 12/22/1998

Radiograph 12/17/2013

CLINICAL DATA: Trauma

EXAM:
CT HEAD WITHOUT CONTRAST
CT CERVICAL SPINE WITHOUT CONTRAST
TECHNIQUE: Multidetector CT imaging of the head and cervical spine was
performed following the standard protocol without intravenous
contrast. Multiplanar CT image reconstructions of the cervical spine
were also generated.

[Series 3: head wo · axial · 0.47mm/px · z∈[-234,-119]mm · 9 of 28 slices shown, 12 images]
[im 3/28  brain]
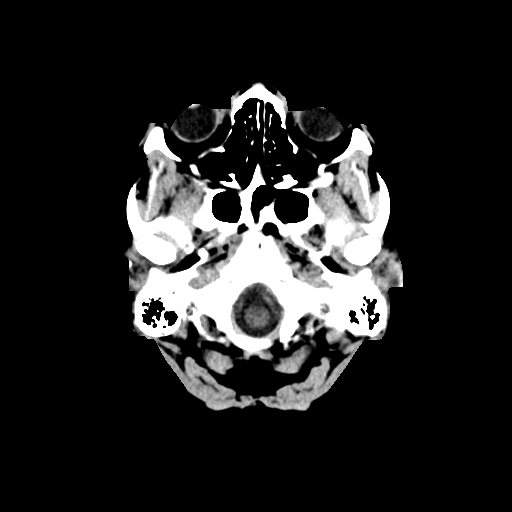
[im 3/28  bone]
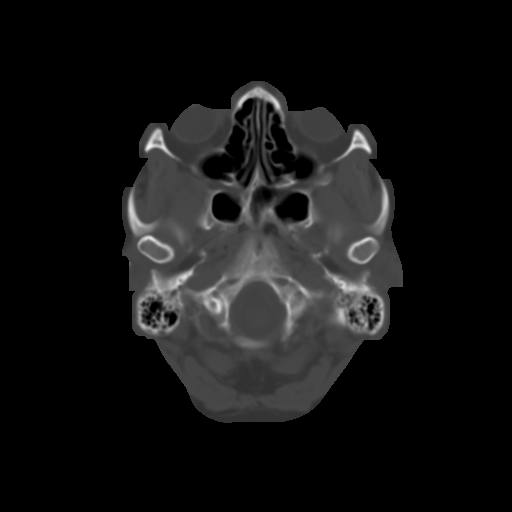
[im 6/28  brain]
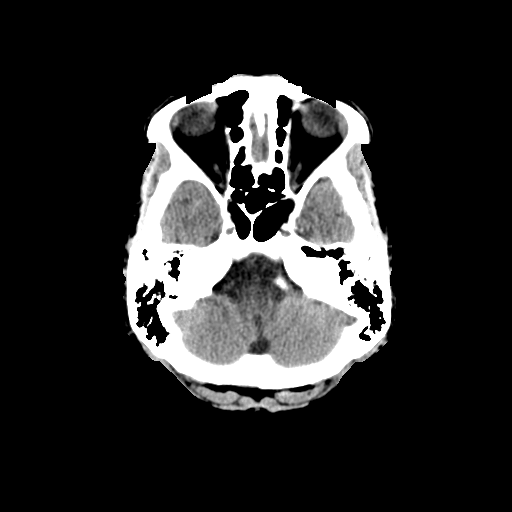
[im 9/28  brain]
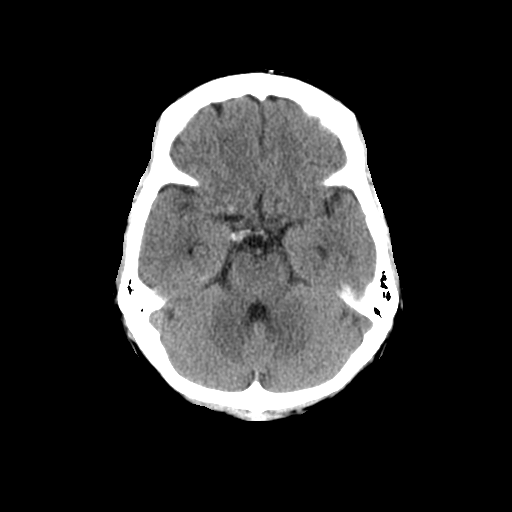
[im 12/28  brain]
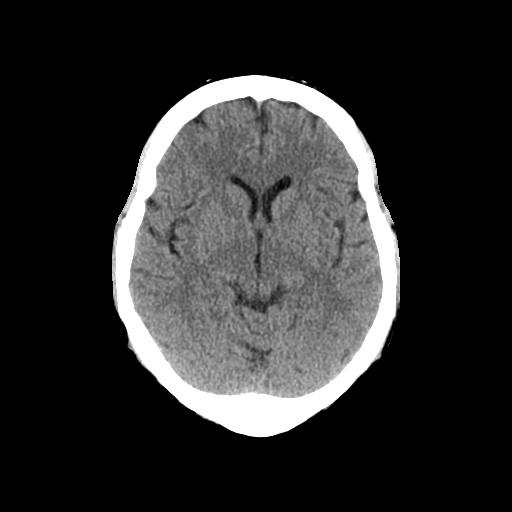
[im 15/28  brain]
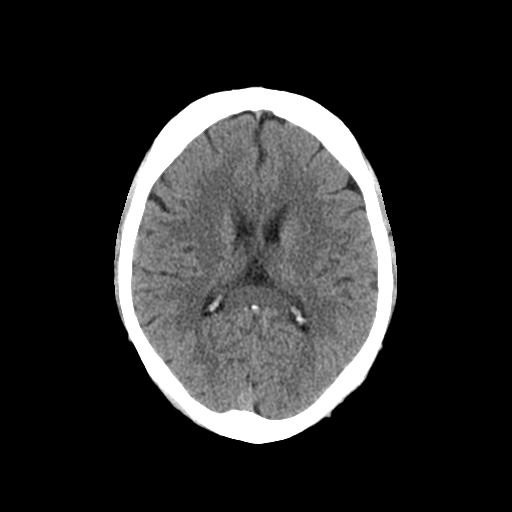
[im 15/28  bone]
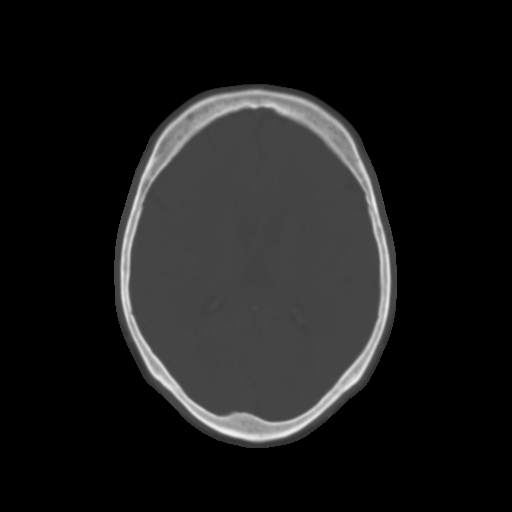
[im 17/28  brain]
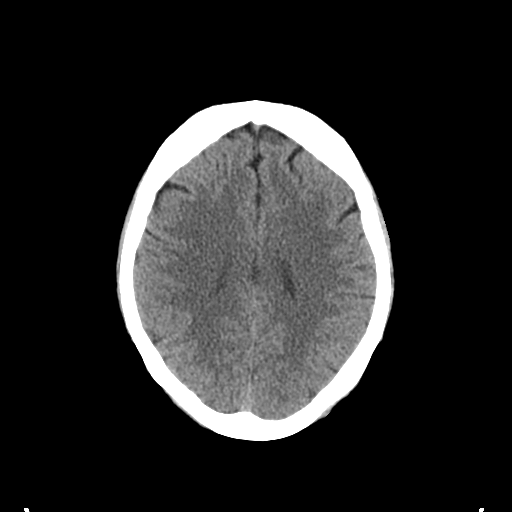
[im 20/28  brain]
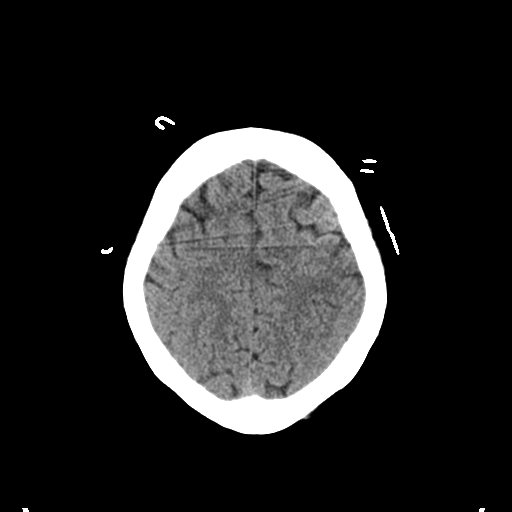
[im 23/28  brain]
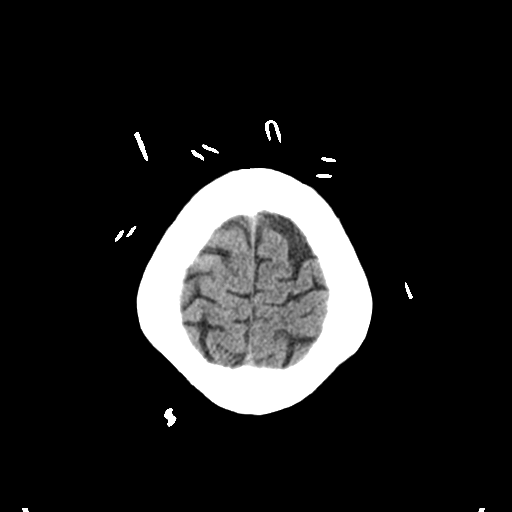
[im 26/28  brain]
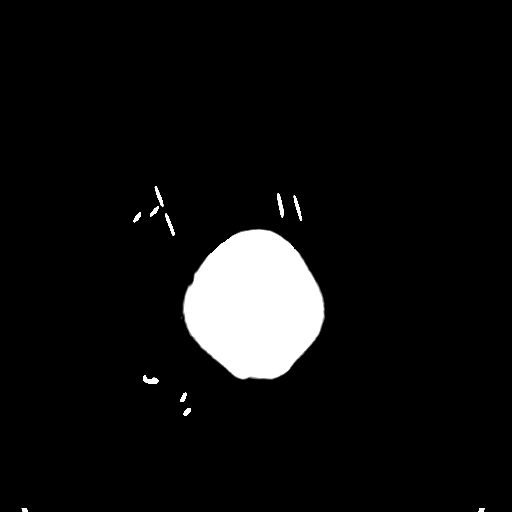
[im 26/28  bone]
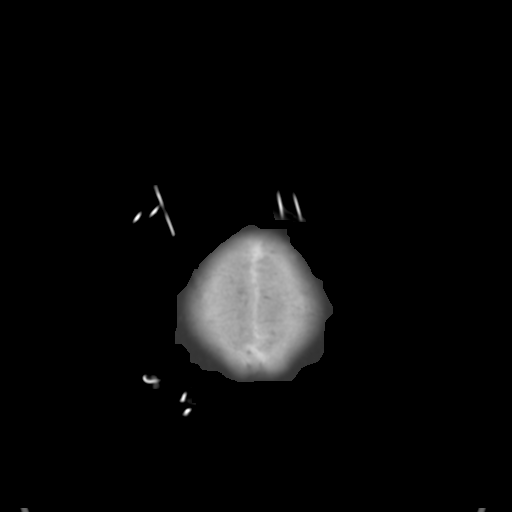

[Series 5: coronal soft tissue · coronal · 0.29mm/px · 3 of 61 slices shown]
[im 21/61  brain]
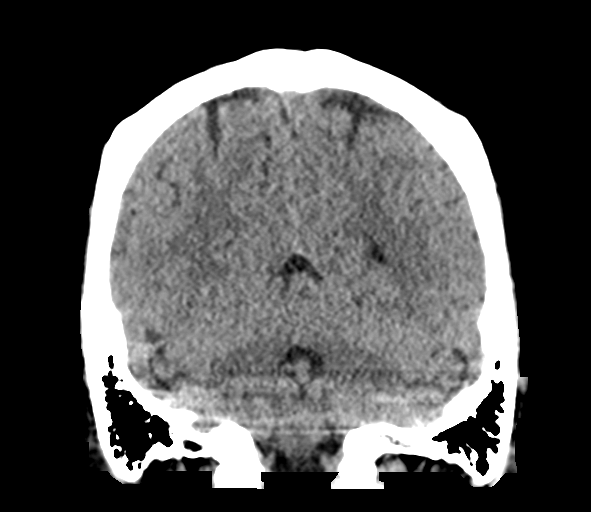
[im 27/61  brain]
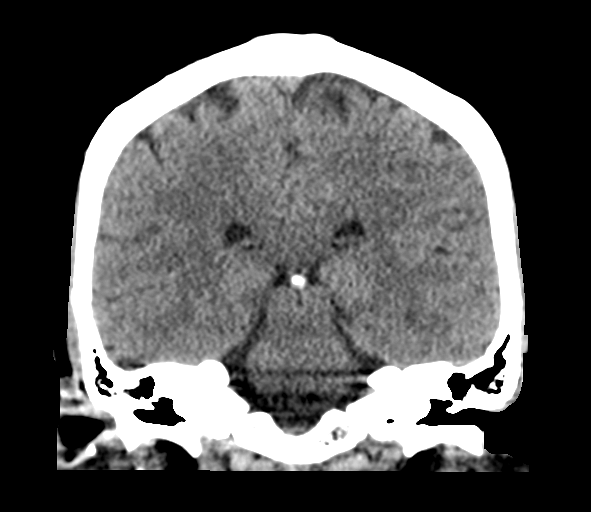
[im 34/61  brain]
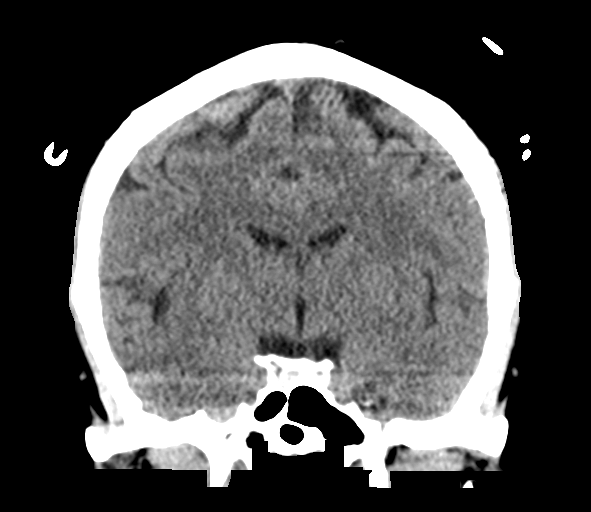

[Series 6: sagittal soft tissue · sagittal · 0.30mm/px · 3 of 48 slices shown]
[im 16/48  brain]
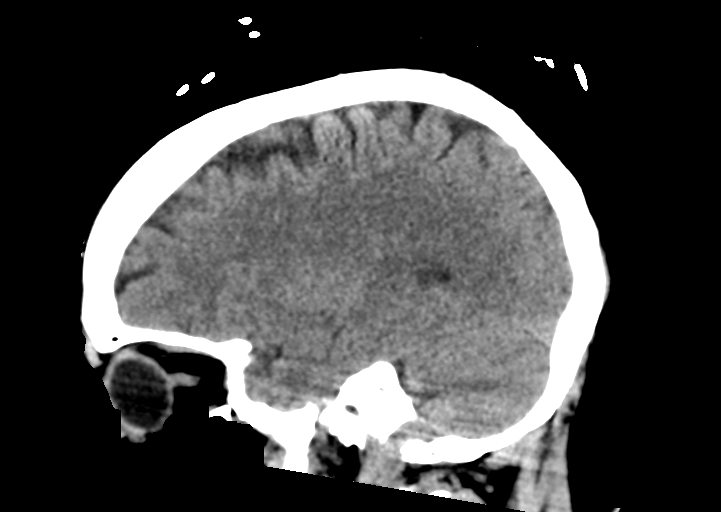
[im 24/48  brain]
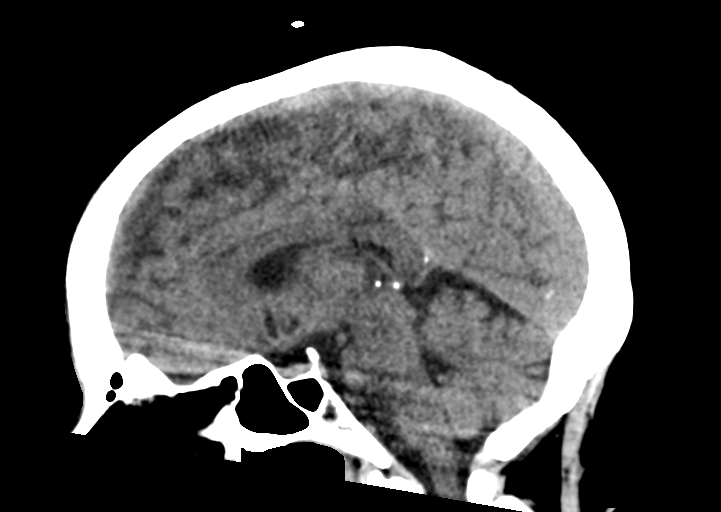
[im 32/48  brain]
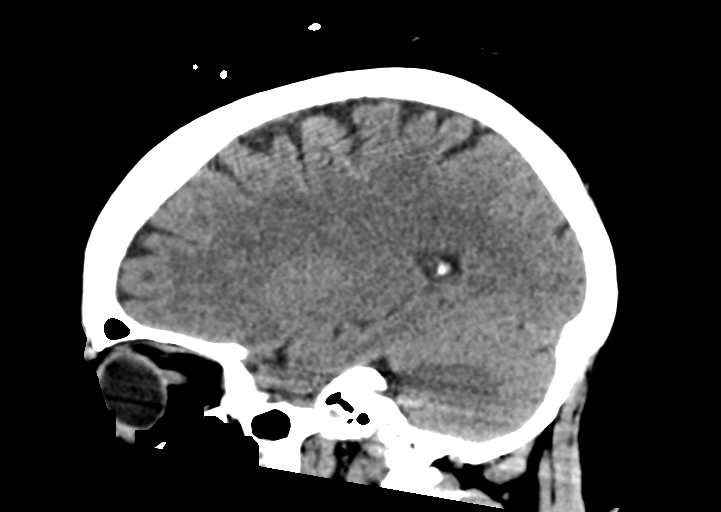

[15 of 45 positions shown; findings below may reference images not displayed]

FINDINGS: CT HEAD FINDINGS

Brain: No evidence of acute infarction, hemorrhage, hydrocephalus,
extra-axial collection or mass lesion/mass effect.

Vascular: No hyperdense vessel or unexpected calcification.

Skull: Normal. Negative for fracture or focal lesion.

Sinuses/Orbits: No acute finding.

Other: None

CT CERVICAL SPINE FINDINGS

Alignment: No subluxation.  Facet alignment within normal limits.

Skull base and vertebrae: No acute fracture. No primary bone lesion
or focal pathologic process.

Soft tissues and spinal canal: No prevertebral fluid or swelling. No
visible canal hematoma.

Disc levels: Multilevel degenerative change. Mild disc space
narrowing at C3-C4. Moderate degenerative changes C5-C6, C6-C7 and
C7-T1. Facet degenerative changes at levels with foraminal stenosis

Upper chest: Negative.

Other: None
IMPRESSION: 1. Negative non contrasted CT appearance of the brain.
2. Degenerative changes of the cervical spine. No acute osseous
abnormality

## 2021-12-20 DIAGNOSIS — M5136 Other intervertebral disc degeneration, lumbar region: Secondary | ICD-10-CM | POA: Insufficient documentation

## 2022-01-10 ENCOUNTER — Encounter: Payer: Self-pay | Admitting: Obstetrics and Gynecology

## 2022-01-26 ENCOUNTER — Other Ambulatory Visit: Payer: Self-pay | Admitting: Obstetrics and Gynecology

## 2022-01-26 DIAGNOSIS — N952 Postmenopausal atrophic vaginitis: Secondary | ICD-10-CM

## 2022-01-28 NOTE — Telephone Encounter (Signed)
Unclear when pt's AEX is due. Saw [place] in 03/2021 for abn pap and colpo. No AEX recall is placed, only OV for repeat pap recall placed for 05/2022.  Last mammo 01/08/2022-neg birads 1

## 2022-03-27 DIAGNOSIS — E7800 Pure hypercholesterolemia, unspecified: Secondary | ICD-10-CM | POA: Diagnosis not present

## 2022-03-27 DIAGNOSIS — Z79899 Other long term (current) drug therapy: Secondary | ICD-10-CM | POA: Diagnosis not present

## 2022-03-27 DIAGNOSIS — E559 Vitamin D deficiency, unspecified: Secondary | ICD-10-CM | POA: Diagnosis not present

## 2022-03-27 DIAGNOSIS — J209 Acute bronchitis, unspecified: Secondary | ICD-10-CM | POA: Diagnosis not present

## 2022-03-27 DIAGNOSIS — M5136 Other intervertebral disc degeneration, lumbar region: Secondary | ICD-10-CM | POA: Diagnosis not present

## 2022-03-27 DIAGNOSIS — Z0000 Encounter for general adult medical examination without abnormal findings: Secondary | ICD-10-CM | POA: Diagnosis not present

## 2022-03-27 DIAGNOSIS — R7303 Prediabetes: Secondary | ICD-10-CM | POA: Diagnosis not present

## 2022-03-27 DIAGNOSIS — M129 Arthropathy, unspecified: Secondary | ICD-10-CM | POA: Diagnosis not present

## 2022-04-26 DIAGNOSIS — R7303 Prediabetes: Secondary | ICD-10-CM | POA: Diagnosis not present

## 2022-04-26 DIAGNOSIS — E559 Vitamin D deficiency, unspecified: Secondary | ICD-10-CM | POA: Diagnosis not present

## 2022-04-26 DIAGNOSIS — Z0130 Encounter for examination of blood pressure without abnormal findings: Secondary | ICD-10-CM | POA: Diagnosis not present

## 2022-04-26 DIAGNOSIS — M47819 Spondylosis without myelopathy or radiculopathy, site unspecified: Secondary | ICD-10-CM | POA: Diagnosis not present

## 2022-04-26 DIAGNOSIS — E7800 Pure hypercholesterolemia, unspecified: Secondary | ICD-10-CM | POA: Diagnosis not present

## 2022-04-26 DIAGNOSIS — Z79899 Other long term (current) drug therapy: Secondary | ICD-10-CM | POA: Diagnosis not present

## 2022-04-30 ENCOUNTER — Other Ambulatory Visit (HOSPITAL_COMMUNITY)
Admission: RE | Admit: 2022-04-30 | Discharge: 2022-04-30 | Disposition: A | Discharge status: Home / Self Care | Payer: 59 | Source: Ambulatory Visit | Attending: Obstetrics and Gynecology | Admitting: Obstetrics and Gynecology

## 2022-04-30 ENCOUNTER — Encounter: Payer: Self-pay | Admitting: Obstetrics and Gynecology

## 2022-04-30 ENCOUNTER — Ambulatory Visit (INDEPENDENT_AMBULATORY_CARE_PROVIDER_SITE_OTHER): Payer: 59 | Admitting: Obstetrics and Gynecology

## 2022-04-30 VITALS — BP 122/68 | HR 73

## 2022-04-30 DIAGNOSIS — Z01419 Encounter for gynecological examination (general) (routine) without abnormal findings: Secondary | ICD-10-CM | POA: Insufficient documentation

## 2022-04-30 DIAGNOSIS — N888 Other specified noninflammatory disorders of cervix uteri: Secondary | ICD-10-CM | POA: Diagnosis not present

## 2022-04-30 DIAGNOSIS — Z1151 Encounter for screening for human papillomavirus (HPV): Secondary | ICD-10-CM | POA: Insufficient documentation

## 2022-04-30 DIAGNOSIS — Z87410 Personal history of cervical dysplasia: Secondary | ICD-10-CM | POA: Diagnosis present

## 2022-04-30 DIAGNOSIS — Z124 Encounter for screening for malignant neoplasm of cervix: Secondary | ICD-10-CM | POA: Insufficient documentation

## 2022-04-30 NOTE — Progress Notes (Signed)
GYNECOLOGY  VISIT   HPI: 62 y.o.   Legally Separated Black or African American Not Hispanic or Latino  female   No obstetric history on file. with Patient's last menstrual period was 12/08/2011.   here for a f/u pap.   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 Loop cone from 06/11/21 was negative for dysplasia, ECC non diagnostic, no tissue recovered.  Cervix has been friable since the leep. Some improvement with vaginal estrogen. She had one episode of spotting when sexually active (only active that one time). No other bleeding.   Pap from 10/23/21 WNL, negative hpv.  She is using vaginal estrogen cream.   GYNECOLOGIC HISTORY: Patient's last menstrual period was 12/08/2011. Contraception:pmp  Menopausal hormone therapy: estradiol         OB History   No obstetric history on file.        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   Arthritis 10/23/2016   OBESITY 05/10/2009   ANXIETY 05/10/2009   MIGRAINE HEADACHE 04/15/2009   PNEUMONIA, COMMUNITY ACQUIRED, PNEUMOCOCCAL 04/15/2009   ASTHMA 04/15/2009    Past Medical History:  Diagnosis Date   Anxiety    Arthritis    back and knee   Cervical dysplasia    History of Bell's palsy 2015   right side, per pt resolved   Migraines    Wears glasses     Past Surgical History:  Procedure Laterality Date   CESAREAN SECTION     HYSTEROSCOPY WITH D & C  07/28/2002      LEEP N/A 06/11/2021   Procedure: LOOP ELECTROSURGICAL EXCISION PROCEDURE (LEEP) with colposcopy;  Surgeon: Romualdo Bolk, MD;  Location: [facility];  Service: Gynecology;  Laterality: N/A;  Wants the laser to attach to the colposcope   TOTAL KNEE ARTHROPLASTY Right 05/18/2019   Procedure: TOTAL KNEE ARTHROPLASTY;  Surgeon: Durene Romans, MD;  Location: [facility];  Service: Orthopedics;  Laterality: Right;      Current Outpatient Medications  Medication Sig Dispense Refill   acetaminophen (TYLENOL) 500 MG tablet Take 500 mg by mouth every 6 (six) hours as needed for moderate pain.     albuterol (VENTOLIN HFA) 108 (90 Base) MCG/ACT inhaler Inhale 1-2 puffs into the lungs every 6 (six) hours as needed for wheezing or shortness of breath.     BELSOMRA 20 MG TABS Take 20 mg by mouth at bedtime as needed (for sleep).     Cholecalciferol (VITAMIN D3 GUMMIES PO) Take 2 tablets by mouth daily.     estradiol (ESTRACE) 0.1 MG/GM vaginal cream INSERT 1 GRAM VAGINALLY 2 TIMES A WEEK AT BEDTIME 42.5 g 0   gabapentin (NEURONTIN) 800 MG tablet Take 800 mg by mouth at bedtime as needed (for nerve pain).     hydrOXYzine (ATARAX) 25 MG tablet Take 1 tablet (25 mg total) by mouth every 6 (six) hours. 12 tablet 0   hydrOXYzine (ATARAX) 25 MG tablet Take 1 tablet (25 mg total) by mouth every 6 (six) hours. 12 tablet 0   levorphanol (LEVODROMORAN) 2 MG tablet Take 2 mg by mouth 3 (three) times daily as needed.     phentermine (ADIPEX-P) 37.5 MG tablet Take 37.5 mg by mouth daily before breakfast.     Polyethylene Glycol 3350 (MIRALAX PO) Take 17 g by mouth daily as needed (for constipation).  pravastatin (PRAVACHOL) 80 MG tablet Take 80 mg by mouth at bedtime.     Semaglutide, 1 MG/DOSE, (OZEMPIC, 1 MG/DOSE,) 2 MG/1.5ML SOPN Inject 1 mg into the skin every Friday.     topiramate (TOPAMAX) 100 MG tablet Take 100 mg by mouth at bedtime.     No current facility-administered medications for this visit.     ALLERGIES: Patient has no allergy information on record.  Family History  Problem Relation Age of Onset   Hypertension Other    Diabetes Other     Social History   Socioeconomic History   Marital status: Legally Separated    Spouse name: Not on file   Number of children: Not on file   Years of education: Not on file   Highest education level: Not on file  Occupational History   Not on file   Tobacco Use   Smoking status: Never   Smokeless tobacco: Never  Vaping Use   Vaping Use: Never used  Substance and Sexual Activity   Alcohol use: Not Currently    Comment: occasional   Drug use: No   Sexual activity: Yes    Birth control/protection: Post-menopausal  Other Topics Concern   Not on file  Social History Narrative   Not on file   Social Determinants of Health   Financial Resource Strain: Not on file  Food Insecurity: Not on file  Transportation Needs: Not on file  Physical Activity: Not on file  Stress: Not on file  Social Connections: Not on file  Intimate Partner Violence: Not on file    Review of Systems  All other systems reviewed and are negative.   PHYSICAL EXAMINATION:    BP 122/68   Pulse 73   LMP 12/08/2011   SpO2 100%     General appearance: alert, cooperative and appears stated age   Pelvic: External genitalia:  no lesions              Urethra:  normal appearing urethra with no masses, tenderness or lesions              Bartholins and Skenes: normal                 Vagina: normal appearing vagina with normal color and discharge, no lesions              Cervix:  no lesions, flush with her vagina, slightly friable, cervical stenosis.  Slight bleeding with the pap.                Chaperone was present for exam.  1. History of cervical dysplasia S/p loop cone in 6/23 was negative for dysplasia, ECC was non diagnostic.  Negate pap with negative hpv testing in 10/23 - Cytology - PAP  2. Screening for cervical cancer - Cytology - PAP  3. Friable cervix Since her leep, improvement with vaginal estrogen. One episode of spotting with sex, no spontaneous bleeding. Call with further spotting.

## 2022-05-01 DIAGNOSIS — Z79899 Other long term (current) drug therapy: Secondary | ICD-10-CM | POA: Diagnosis not present

## 2022-05-03 LAB — CYTOLOGY - PAP
Comment: NEGATIVE
Diagnosis: NEGATIVE
High risk HPV: NEGATIVE

## 2022-05-06 ENCOUNTER — Other Ambulatory Visit: Payer: Self-pay | Admitting: Obstetrics and Gynecology

## 2022-05-06 DIAGNOSIS — N952 Postmenopausal atrophic vaginitis: Secondary | ICD-10-CM

## 2022-05-07 NOTE — Telephone Encounter (Signed)
Medication refill request: estrace vaginal cream Last OV:  04-30-22 Last MMG (if hormonal medication request): 01-08-22 neg Refill authorized: please approve if appropriate

## 2022-05-28 ENCOUNTER — Emergency Department (HOSPITAL_COMMUNITY): Payer: 59

## 2022-05-28 ENCOUNTER — Other Ambulatory Visit: Payer: Self-pay

## 2022-05-28 ENCOUNTER — Emergency Department (HOSPITAL_COMMUNITY)
Admission: EM | Admit: 2022-05-28 | Discharge: 2022-05-28 | Disposition: A | Discharge status: Home / Self Care | Payer: 59 | Attending: Emergency Medicine | Admitting: Emergency Medicine

## 2022-05-28 ENCOUNTER — Encounter (HOSPITAL_COMMUNITY): Payer: Self-pay

## 2022-05-28 DIAGNOSIS — R519 Headache, unspecified: Secondary | ICD-10-CM | POA: Diagnosis not present

## 2022-05-28 DIAGNOSIS — N390 Urinary tract infection, site not specified: Secondary | ICD-10-CM | POA: Insufficient documentation

## 2022-05-28 DIAGNOSIS — R9431 Abnormal electrocardiogram [ECG] [EKG]: Secondary | ICD-10-CM | POA: Diagnosis not present

## 2022-05-28 LAB — URINALYSIS, ROUTINE W REFLEX MICROSCOPIC
Bilirubin Urine: NEGATIVE
Glucose, UA: NEGATIVE mg/dL
Ketones, ur: NEGATIVE mg/dL
Nitrite: NEGATIVE
Protein, ur: NEGATIVE mg/dL
Specific Gravity, Urine: 1.006 (ref 1.005–1.030)
pH: 6 (ref 5.0–8.0)

## 2022-05-28 LAB — COMPREHENSIVE METABOLIC PANEL
ALT: 26 U/L (ref 0–44)
AST: 26 U/L (ref 15–41)
Albumin: 2.9 g/dL — ABNORMAL LOW (ref 3.5–5.0)
Alkaline Phosphatase: 68 U/L (ref 38–126)
Anion gap: 11 (ref 5–15)
BUN: <5 mg/dL — ABNORMAL LOW (ref 8–23)
CO2: 29 mmol/L (ref 22–32)
Calcium: 8.8 mg/dL — ABNORMAL LOW (ref 8.9–10.3)
Chloride: 100 mmol/L (ref 98–111)
Creatinine, Ser: 0.85 mg/dL (ref 0.44–1.00)
GFR, Estimated: >60 mL/min (ref >60)
Glucose, Bld: 94 mg/dL (ref 70–99)
Potassium: 2.8 mmol/L — ABNORMAL LOW (ref 3.5–5.1)
Sodium: 140 mmol/L (ref 135–145)
Total Bilirubin: 0.5 mg/dL (ref 0.3–1.2)
Total Protein: 6.9 g/dL (ref 6.5–8.1)

## 2022-05-28 LAB — WET PREP, GENITAL
Clue Cells Wet Prep HPF POC: NONE SEEN
Sperm: NONE SEEN
Trich, Wet Prep: NONE SEEN
WBC, Wet Prep HPF POC: >=10 — AB (ref <10)
Yeast Wet Prep HPF POC: NONE SEEN

## 2022-05-28 LAB — CBC
HCT: 35.9 % — ABNORMAL LOW (ref 36.0–46.0)
Hemoglobin: 11.5 g/dL — ABNORMAL LOW (ref 12.0–15.0)
MCH: 28.5 pg (ref 26.0–34.0)
MCHC: 32 g/dL (ref 30.0–36.0)
MCV: 88.9 fL (ref 80.0–100.0)
Platelets: 480 10*3/uL — ABNORMAL HIGH (ref 150–400)
RBC: 4.04 MIL/uL (ref 3.87–5.11)
RDW: 14.2 % (ref 11.5–15.5)
WBC: 8.2 10*3/uL (ref 4.0–10.5)
nRBC: 0 % (ref 0.0–0.2)

## 2022-05-28 MED ORDER — CEPHALEXIN 250 MG PO CAPS: 250.0000 mg | ORAL_CAPSULE | Freq: Four times a day (QID) | ORAL | 0 refills | Status: DC

## 2022-05-28 MED ORDER — CEPHALEXIN 250 MG PO CAPS: 250.0000 mg | ORAL_CAPSULE | Freq: Four times a day (QID) | ORAL | 0 refills | Status: AC

## 2022-05-28 MED ORDER — OXYCODONE-ACETAMINOPHEN 5-325 MG PO TABS
1.0000 | ORAL_TABLET | Freq: Once | ORAL | Status: AC
  Administered 2022-05-28: 1 via ORAL
  Filled 2022-05-28: qty 1

## 2022-05-28 MED ORDER — POTASSIUM CHLORIDE CRYS ER 20 MEQ PO TBCR
40.0000 meq | EXTENDED_RELEASE_TABLET | Freq: Once | ORAL | Status: AC
  Administered 2022-05-28: 40 meq via ORAL
  Filled 2022-05-28: qty 2

## 2022-05-28 NOTE — Discharge Instructions (Addendum)
Please take your antibiotics as prescribed. Take tylenol/ibuprofen to 3 times a day for pain. I recommend close follow-up with PCP for reevaluation.  Please do not hesitate to return to emergency department if worrisome signs symptoms we discussed become apparent.

## 2022-05-28 NOTE — ED Triage Notes (Signed)
PT arrives ambulatory POV with a c/o having body aches ,chill, headaches for a week.New onset of skin blotches also.Pt also states she may a yeast infection

## 2022-05-28 NOTE — ED Provider Notes (Signed)
[place] EMERGENCY DEPARTMENT AT [facility] Provider Note   CSN: [id number] Arrival date & time: 05/28/22  1426     History  Chief Complaint  Patient presents with   Headache    Audrey Weeks is a 62 y.o. female with a past with history of migraines, anxiety presents today for evaluation of headache.  Patient reports that she had a tooth extraction about a month ago.  She reports that 20 teeth were removed.  Since then she has had headache.  Pain is located on the left side of her head and goes down to her neck and jaw.  She has a history of migraine however she states the headache this time is different. States skin on her face felt very dried. She also reports itching and burning with urination.  She denies any fever, nausea, vomiting, chest pain or shortness of breath, bowel change, blood in stool or urine, extremity weakness or numbness.    Headache     Past Medical History:  Diagnosis Date   Anxiety    Arthritis    back and knee   Cervical dysplasia    History of Bell's palsy 2015   right side, per pt resolved   Migraines    Wears glasses    Past Surgical History:  Procedure Laterality Date   CESAREAN SECTION     HYSTEROSCOPY WITH D & C  07/28/2002   @[facility]    LEEP N/A 06/11/2021   Procedure: LOOP ELECTROSURGICAL EXCISION PROCEDURE (LEEP) with colposcopy;  Surgeon: Romualdo Bolk, MD;  Location: [facility];  Service: Gynecology;  Laterality: N/A;  Wants the laser to attach to the colposcope   TOTAL KNEE ARTHROPLASTY Right 05/18/2019   Procedure: TOTAL KNEE ARTHROPLASTY;  Surgeon: Durene Romans, MD;  Location: [facility];  Service: Orthopedics;  Laterality: Right;      Home Medications Prior to Admission medications   Medication Sig Start Date End Date Taking? Authorizing Provider  acetaminophen (TYLENOL) 500 MG tablet Take 500 mg by mouth every 6 (six) hours as needed for moderate pain.    [provider]  albuterol  (VENTOLIN HFA) 108 (90 Base) MCG/ACT inhaler Inhale 1-2 puffs into the lungs every 6 (six) hours as needed for wheezing or shortness of breath.    [provider]  BELSOMRA 20 MG TABS Take 20 mg by mouth at bedtime as needed (for sleep). 01/29/21   [provider]  Cholecalciferol (VITAMIN D3 GUMMIES PO) Take 2 tablets by mouth daily.    [provider]  estradiol (ESTRACE) 0.1 MG/GM vaginal cream INSERT 1 GRAM VAGINALLY 2 TIMES A WEEK AT BEDTIME 05/07/22   Romualdo Bolk, MD  gabapentin (NEURONTIN) 800 MG tablet Take 800 mg by mouth at bedtime as needed (for nerve pain).    [provider]  hydrOXYzine (ATARAX) 25 MG tablet Take 1 tablet (25 mg total) by mouth every 6 (six) hours. 08/15/21   Jeannie Fend, PA-C  hydrOXYzine (ATARAX) 25 MG tablet Take 1 tablet (25 mg total) by mouth every 6 (six) hours. 08/15/21   Al Decant, PA-C  levorphanol (LEVODROMORAN) 2 MG tablet Take 2 mg by mouth 3 (three) times daily as needed. 04/08/22   [provider]  phentermine (ADIPEX-P) 37.5 MG tablet Take 37.5 mg by mouth daily before breakfast.    [provider]  Polyethylene Glycol 3350 (MIRALAX PO) Take 17 g by mouth daily as needed (for constipation).    [provider]  pravastatin (PRAVACHOL) 80 MG tablet Take 80 mg by mouth at bedtime. 12/04/20   [provider]  Semaglutide, 1 MG/DOSE, (OZEMPIC, 1 MG/DOSE,) 2 MG/1.5ML SOPN Inject 1 mg into the skin every Friday.    [provider]  topiramate (TOPAMAX) 100 MG tablet Take 100 mg by mouth at bedtime. 12/08/20   [provider]      Allergies    Patient has no known allergies.    Review of Systems   Review of Systems  Neurological:  Positive for headaches.    Physical Exam Updated Vital Signs BP 136/80 (BP Location: Right Arm)   Pulse 88   Temp 98.1 F (36.7 C) (Oral)   Resp 16   LMP 12/08/2011   SpO2 97%  Physical Exam Vitals and nursing note  reviewed.  Constitutional:      Appearance: Normal appearance.  HENT:     Head: Normocephalic and atraumatic.     Mouth/Throat:     Mouth: Mucous membranes are moist.  Eyes:     General: No scleral icterus. Cardiovascular:     Rate and Rhythm: Normal rate and regular rhythm.     Pulses: Normal pulses.     Heart sounds: Normal heart sounds.  Pulmonary:     Effort: Pulmonary effort is normal.     Breath sounds: Normal breath sounds.  Abdominal:     General: Abdomen is flat.     Palpations: Abdomen is soft.     Tenderness: There is no abdominal tenderness.  Genitourinary:    Comments: Normal external female genitalia, minimal white vaginal discharge, no vaginal lesions, cervix visualized without gross lesions. The uterus was normal S/S/C and NT. No adnexal masses were palpated. Chaperone was present during this exam.   Musculoskeletal:        General: No deformity.  Skin:    General: Skin is warm.     Findings: No rash.  Neurological:     General: No focal deficit present.     Mental Status: She is alert.     Comments: Cranial nerves II through XII intact. Intact sensation to light touch in all 4 extremities. 5/5 strength in all 4 extremities. Intact finger-to-nose and heel-to-shin of all 4 extremities. No visual field cuts. No neglect noted. No aphasia noted.    Psychiatric:        Mood and Affect: Mood normal.     ED Results / Procedures / Treatments   Labs (all labs ordered are listed, but only abnormal results are displayed) Labs Reviewed  WET PREP, GENITAL  URINALYSIS, ROUTINE W REFLEX MICROSCOPIC  CBC  COMPREHENSIVE METABOLIC PANEL  GC/CHLAMYDIA PROBE AMP ([place]) NOT AT [facility]    EKG None  Radiology No results found.  Procedures Procedures    Medications Ordered in ED Medications  oxyCODONE-acetaminophen (PERCOCET/ROXICET) 5-325 MG per tablet 1 tablet (has no administration in time range)    ED Course/ Medical Decision Making/ A&P                              Medical Decision Making Amount and/or Complexity of Data Reviewed Labs: ordered. Radiology: ordered.  Risk Prescription drug management.   This patient presents to the ED for headache, dysuria, this involves an extensive number of treatment options, and is a complaint that carries with a high risk of complications and morbidity.  The differential diagnosis includes migraine headache, tension type headache, CVA/ICH, sinusitis,  UTI, pyelonephritis, STI, kidney stone, infectious etiology.  This is not an exhaustive list.  Lab tests: I ordered and personally interpreted labs.  The pertinent results include: WBC unremarkable. Hbg unremarkable. Platelets unremarkable. Electrolytes with potassium 2.8. BUN, creatinine unremarkable.  Urinalysis positive for leukocytes.  Imaging studies: I ordered imaging studies. I personally reviewed, interpreted imaging and agree with the radiologist's interpretations. The results include: CT head with no acute abnormalities.  Problem list/ ED course/ Critical interventions/ Medical management: HPI: See above Vital signs within normal range and stable throughout visit. Laboratory/imaging studies significant for: See above. On physical examination, patient is afebrile and appears in no acute distress.  Patient presents with a chief complaint of headaches in the last 1 month.  She had a dental procedure where 20 teeth were removed a month ago when the headache started.  States that the headache is different to her usual migraine headache.  CT scan with no acute abnormalities.  Low suspicion for intracranial bleed, tumor or any other emergent intracranial abnormalities.  Patient also complains of itching burning with urination and itching in her vagina.  GU exam done with RN present throughout examination show minimal white thick discharge in the vaginal canal.  Negative cervical motion tenderness.  Low suspicion for PID/TOA.  Urinalysis is positive for  leukocytes.  CMP with hypokalemia 2.8.  Given potassium 40 mg p.o.  Given Percocet for pain.  Reevaluation of patient after this medication showed that the patient improved. I will send an Rx of Keflex for UTI. Advised patient to take Tylenol/ibuprofen/naproxen for pain, follow-up with primary care physician for further evaluation and management, return to the ER if new or worsening symptoms. I have reviewed the patient home medicines and have made adjustments as needed.  Cardiac monitoring/EKG: The patient was maintained on a cardiac monitor.  I personally reviewed and interpreted the cardiac monitor which showed an underlying rhythm of: sinus rhythm.  Additional history obtained: External records from outside source obtained and reviewed including: Chart review including previous notes, labs, imaging.  Consultations obtained:  Disposition Continued outpatient therapy. Follow-up with PCP recommended for reevaluation of symptoms. Treatment plan discussed with patient.  Pt acknowledged understanding was agreeable to the plan. Worrisome signs and symptoms were discussed with patient, and patient acknowledged understanding to return to the ED if they noticed these signs and symptoms. Patient was stable upon discharge.   This chart was dictated using voice recognition software.  Despite best efforts to proofread,  errors can occur which can change the documentation meaning.          Final Clinical Impression(s) / ED Diagnoses Final diagnoses:  Acute nonintractable headache, unspecified headache type  Lower urinary tract infectious disease    Rx / DC Orders ED Discharge Orders          Ordered    cephALEXin (KEFLEX) 250 MG capsule  4 times daily        05/28/22 1755              Jeanelle Malling, [place] 05/28/22 1805    Terald Sleeper, MD 05/28/22 [phone]

## 2022-05-29 LAB — GC/CHLAMYDIA PROBE AMP (~~LOC~~) NOT AT ARMC
Chlamydia: NEGATIVE
Comment: NEGATIVE
Comment: NORMAL
Neisseria Gonorrhea: NEGATIVE

## 2022-07-24 ENCOUNTER — Encounter: Payer: Self-pay | Admitting: Nurse Practitioner

## 2022-07-24 ENCOUNTER — Ambulatory Visit (INDEPENDENT_AMBULATORY_CARE_PROVIDER_SITE_OTHER): Payer: 59 | Admitting: Nurse Practitioner

## 2022-07-24 VITALS — BP 122/80 | HR 78

## 2022-07-24 DIAGNOSIS — R350 Frequency of micturition: Secondary | ICD-10-CM | POA: Diagnosis not present

## 2022-07-24 DIAGNOSIS — Z113 Encounter for screening for infections with a predominantly sexual mode of transmission: Secondary | ICD-10-CM | POA: Diagnosis not present

## 2022-07-24 DIAGNOSIS — N3000 Acute cystitis without hematuria: Secondary | ICD-10-CM

## 2022-07-24 MED ORDER — SULFAMETHOXAZOLE-TRIMETHOPRIM 800-160 MG PO TABS
1.0000 | ORAL_TABLET | Freq: Two times a day (BID) | ORAL | 0 refills | Status: AC
Start: 2022-07-24 — End: 2022-07-27

## 2022-07-24 NOTE — Progress Notes (Signed)
   Acute Office Visit  Subjective:    Patient ID: Audrey Weeks, female    DOB: 1960-01-26, 62 y.o.   MRN: [id number]   HPI 62 y.o. presents today for frequent urination and decrease in output x 1 week. Would like STD screening today. Denies vaginal symptoms.   Patient's last menstrual period was 12/08/2011.    Review of Systems  Constitutional: Negative.   Genitourinary:  Positive for frequency. Negative for difficulty urinating, dysuria, flank pain, hematuria, urgency, vaginal discharge and vaginal pain.       Objective:    Physical Exam Constitutional:      Appearance: Normal appearance.   GU: Not indicated  BP 122/80   Pulse 78   LMP 12/08/2011   SpO2 100%  Wt Readings from Last 3 Encounters:  08/14/21 198 lb (89.8 kg)  06/20/21 200 lb (90.7 kg)  06/11/21 198 lb 4.8 oz (89.9 kg)       UA: 2+ leukocytes, negative nitrites, trace blood, 1+ bilirubin, yellow/cloudy. Microscopic: wbc 40-60, rbc 0-2, moderate bacteria  Assessment & Plan:   Problem List Items Addressed This Visit   None Visit Diagnoses     Acute cystitis without hematuria    -  Primary   Relevant Medications   sulfamethoxazole-trimethoprim (BACTRIM DS) 800-160 MG tablet   Frequent urination       Relevant Orders   Urinalysis,Complete w/RFL Culture (Completed)   Screening examination for STD (sexually transmitted disease)       Relevant Orders   C. trachomatis/N. gonorrhoeae RNA      Plan: Acute cystitis - Bactrim 800-160 mg BID x 3 days. Culture pending. GC/CT pending.       Olivia Mackie DNP, 4:02 PM 07/24/2022

## 2022-07-25 DIAGNOSIS — Z79891 Long term (current) use of opiate analgesic: Secondary | ICD-10-CM | POA: Diagnosis not present

## 2022-07-25 DIAGNOSIS — G894 Chronic pain syndrome: Secondary | ICD-10-CM | POA: Diagnosis not present

## 2022-07-25 DIAGNOSIS — M5431 Sciatica, right side: Secondary | ICD-10-CM | POA: Diagnosis not present

## 2022-07-25 DIAGNOSIS — M545 Low back pain, unspecified: Secondary | ICD-10-CM | POA: Diagnosis not present

## 2022-07-25 DIAGNOSIS — Z79899 Other long term (current) drug therapy: Secondary | ICD-10-CM | POA: Diagnosis not present

## 2022-07-25 DIAGNOSIS — M25562 Pain in left knee: Secondary | ICD-10-CM | POA: Diagnosis not present

## 2022-07-25 DIAGNOSIS — G890 Central pain syndrome: Secondary | ICD-10-CM | POA: Diagnosis not present

## 2022-07-25 DIAGNOSIS — M5432 Sciatica, left side: Secondary | ICD-10-CM | POA: Diagnosis not present

## 2022-07-25 LAB — C. TRACHOMATIS/N. GONORRHOEAE RNA
C. trachomatis RNA, TMA: NOT DETECTED
N. gonorrhoeae RNA, TMA: NOT DETECTED

## 2022-07-26 LAB — URINE CULTURE
MICRO NUMBER:: 15211853
Result:: NO GROWTH
SPECIMEN QUALITY:: ADEQUATE

## 2022-07-26 LAB — URINALYSIS, COMPLETE W/RFL CULTURE
Glucose, UA: NEGATIVE
Hyaline Cast: NONE SEEN /LPF
Nitrites, Initial: NEGATIVE
Specific Gravity, Urine: 1.02 (ref 1.001–1.035)
pH: 6 (ref 5.0–8.0)

## 2022-07-26 LAB — CULTURE INDICATED

## 2022-08-01 DIAGNOSIS — M5136 Other intervertebral disc degeneration, lumbar region: Secondary | ICD-10-CM | POA: Diagnosis not present

## 2022-08-08 DIAGNOSIS — M5432 Sciatica, left side: Secondary | ICD-10-CM | POA: Diagnosis not present

## 2022-08-08 DIAGNOSIS — G894 Chronic pain syndrome: Secondary | ICD-10-CM | POA: Diagnosis not present

## 2022-08-08 DIAGNOSIS — M5431 Sciatica, right side: Secondary | ICD-10-CM | POA: Diagnosis not present

## 2022-08-08 DIAGNOSIS — M545 Low back pain, unspecified: Secondary | ICD-10-CM | POA: Diagnosis not present

## 2022-08-08 DIAGNOSIS — Z79891 Long term (current) use of opiate analgesic: Secondary | ICD-10-CM | POA: Diagnosis not present

## 2022-08-08 DIAGNOSIS — G890 Central pain syndrome: Secondary | ICD-10-CM | POA: Diagnosis not present

## 2022-08-08 DIAGNOSIS — M25562 Pain in left knee: Secondary | ICD-10-CM | POA: Diagnosis not present

## 2022-08-08 DIAGNOSIS — Z79899 Other long term (current) drug therapy: Secondary | ICD-10-CM | POA: Diagnosis not present

## 2022-10-17 DIAGNOSIS — E785 Hyperlipidemia, unspecified: Secondary | ICD-10-CM | POA: Diagnosis not present

## 2022-10-17 DIAGNOSIS — Z6834 Body mass index (BMI) 34.0-34.9, adult: Secondary | ICD-10-CM | POA: Diagnosis not present

## 2022-10-17 DIAGNOSIS — F1411 Cocaine abuse, in remission: Secondary | ICD-10-CM | POA: Diagnosis not present

## 2022-10-17 DIAGNOSIS — Z008 Encounter for other general examination: Secondary | ICD-10-CM | POA: Diagnosis not present

## 2022-10-17 DIAGNOSIS — M1990 Unspecified osteoarthritis, unspecified site: Secondary | ICD-10-CM | POA: Diagnosis not present

## 2022-10-17 DIAGNOSIS — F330 Major depressive disorder, recurrent, mild: Secondary | ICD-10-CM | POA: Diagnosis not present

## 2022-10-17 DIAGNOSIS — E669 Obesity, unspecified: Secondary | ICD-10-CM | POA: Diagnosis not present

## 2022-10-17 DIAGNOSIS — R7303 Prediabetes: Secondary | ICD-10-CM | POA: Diagnosis not present

## 2023-01-31 ENCOUNTER — Emergency Department (HOSPITAL_COMMUNITY): Payer: No Typology Code available for payment source

## 2023-01-31 ENCOUNTER — Other Ambulatory Visit: Payer: Self-pay

## 2023-01-31 ENCOUNTER — Encounter (HOSPITAL_COMMUNITY): Payer: Self-pay | Admitting: Emergency Medicine

## 2023-01-31 ENCOUNTER — Emergency Department (HOSPITAL_COMMUNITY)
Admission: EM | Admit: 2023-01-31 | Discharge: 2023-01-31 | Disposition: A | Discharge status: Home / Self Care | Payer: No Typology Code available for payment source | Attending: Emergency Medicine | Admitting: Emergency Medicine

## 2023-01-31 DIAGNOSIS — M25512 Pain in left shoulder: Secondary | ICD-10-CM | POA: Diagnosis present

## 2023-01-31 DIAGNOSIS — M7582 Other shoulder lesions, left shoulder: Secondary | ICD-10-CM

## 2023-01-31 DIAGNOSIS — Z794 Long term (current) use of insulin: Secondary | ICD-10-CM | POA: Diagnosis not present

## 2023-01-31 DIAGNOSIS — J45909 Unspecified asthma, uncomplicated: Secondary | ICD-10-CM | POA: Diagnosis not present

## 2023-01-31 DIAGNOSIS — M25712 Osteophyte, left shoulder: Secondary | ICD-10-CM | POA: Diagnosis not present

## 2023-01-31 DIAGNOSIS — M25511 Pain in right shoulder: Secondary | ICD-10-CM

## 2023-01-31 DIAGNOSIS — Z96651 Presence of right artificial knee joint: Secondary | ICD-10-CM | POA: Insufficient documentation

## 2023-01-31 DIAGNOSIS — M778 Other enthesopathies, not elsewhere classified: Secondary | ICD-10-CM | POA: Diagnosis not present

## 2023-01-31 MED ORDER — IBUPROFEN 600 MG PO TABS: 600.0000 mg | ORAL_TABLET | Freq: Four times a day (QID) | ORAL | 0 refills | Status: AC | PRN

## 2023-01-31 MED ORDER — CYCLOBENZAPRINE HCL 10 MG PO TABS: 10.0000 mg | ORAL_TABLET | Freq: Two times a day (BID) | ORAL | 0 refills | Status: DC | PRN

## 2023-01-31 MED ORDER — LIDOCAINE 5 % EX PTCH: 1.0000 | MEDICATED_PATCH | Freq: Every day | CUTANEOUS | 0 refills | Status: DC | PRN

## 2023-01-31 MED ORDER — IBUPROFEN 600 MG PO TABS: 600.0000 mg | ORAL_TABLET | Freq: Four times a day (QID) | ORAL | 0 refills | Status: DC | PRN

## 2023-01-31 MED ORDER — CYCLOBENZAPRINE HCL 10 MG PO TABS: 10.0000 mg | ORAL_TABLET | Freq: Two times a day (BID) | ORAL | 0 refills | Status: AC | PRN

## 2023-01-31 MED ORDER — ACETAMINOPHEN 325 MG PO TABS: 650.0000 mg | ORAL_TABLET | Freq: Four times a day (QID) | ORAL | 0 refills | Status: AC | PRN

## 2023-01-31 MED ORDER — KETOROLAC TROMETHAMINE 30 MG/ML IJ SOLN
30.0000 mg | Freq: Once | INTRAMUSCULAR | Status: AC
  Administered 2023-01-31: 30 mg via INTRAMUSCULAR
  Filled 2023-01-31: qty 1

## 2023-01-31 MED ORDER — LIDOCAINE 5 % EX PTCH: 1.0000 | MEDICATED_PATCH | Freq: Every day | CUTANEOUS | 0 refills | Status: AC | PRN

## 2023-01-31 MED ORDER — ACETAMINOPHEN 325 MG PO TABS: 650.0000 mg | ORAL_TABLET | Freq: Four times a day (QID) | ORAL | 0 refills | Status: DC | PRN

## 2023-01-31 MED ORDER — IBUPROFEN 800 MG PO TABS
800.0000 mg | ORAL_TABLET | Freq: Once | ORAL | Status: AC
  Administered 2023-01-31: 800 mg via ORAL
  Filled 2023-01-31: qty 1

## 2023-01-31 NOTE — Discharge Instructions (Addendum)
It was a pleasure caring for you today in the emergency department.  Please follow-up with orthopedics.  No heavy lifting left upper extremity for the next 7 days.  You can alternate Tylenol and Motrin per manufacturer's instructions.  Take Flexeril as needed; this can make you sleepy.  Do not drive a vehicle or operate machinery after taking his medication.  Please return to the emergency department for any worsening or worrisome symptoms.

## 2023-01-31 NOTE — Progress Notes (Signed)
Orthopedic Tech Progress Note Patient Details:  Audrey Weeks [birth year] [id number]  Applied sling to patients LUE Ortho Devices Type of Ortho Device: Sling immobilizer Ortho Device/Splint Location: LUE Ortho Device/Splint Interventions: Ordered, Application, Adjustment   Post Interventions Patient Tolerated: Well Instructions Provided: Adjustment of device, Care of device  Diannia Ruder 01/31/2023, 6:38 AM

## 2023-01-31 NOTE — ED Provider Notes (Signed)
[place] EMERGENCY DEPARTMENT AT [facility] Provider Note  CSN: [id number] Arrival date & time: 01/31/23 8295  Chief Complaint(s) Shoulder Pain  HPI Audrey Weeks is a 63 y.o. female with past medical history as below, significant for arthritis, cervical dysplasia, Bell's palsy, migraine headaches, DDD, depression, obesity who presents to the ED with complaint of left shoulder pain  Ongoing around 1 week.  Pain progressively worsening.  She took Motrin yesterday which did not alleviate her symptoms.  She reports having some occasional tingling to her fingers.  No numbness or weakness.  No fevers or chills.  No other complaints verbalized  Past Medical History Past Medical History:  Diagnosis Date   Anxiety    Arthritis    back and knee   Cervical dysplasia    History of Bell's palsy 2015   right side, per pt resolved   Migraines    Wears glasses    Patient Active Problem List   Diagnosis Date Noted   DDD (degenerative disc disease), lumbar 12/20/2021   Depression 03/13/2021   S/P right TKA 05/18/2019   Arthritis 10/23/2016   OBESITY 05/10/2009   ANXIETY 05/10/2009   MIGRAINE HEADACHE 04/15/2009   PNEUMONIA, COMMUNITY ACQUIRED, PNEUMOCOCCAL 04/15/2009   ASTHMA 04/15/2009   Home Medication(s) Prior to Admission medications   Medication Sig Start Date End Date Taking? Authorizing Provider  acetaminophen (TYLENOL) 325 MG tablet Take 2 tablets (650 mg total) by mouth every 6 (six) hours as needed. 01/31/23  Yes Tanda Rockers A, DO  cyclobenzaprine (FLEXERIL) 10 MG tablet Take 1 tablet (10 mg total) by mouth 2 (two) times daily as needed for muscle spasms. 01/31/23  Yes Tanda Rockers A, DO  ibuprofen (ADVIL) 600 MG tablet Take 1 tablet (600 mg total) by mouth every 6 (six) hours as needed. 01/31/23  Yes Tanda Rockers A, DO  lidocaine (LIDODERM) 5 % Place 1 patch onto the skin daily as needed. Remove & Discard patch within 12 hours or as directed by MD 01/31/23  Yes  Sloan Leiter, DO  acetaminophen (TYLENOL) 500 MG tablet Take 500 mg by mouth every 6 (six) hours as needed for moderate pain.    [provider]  albuterol (VENTOLIN HFA) 108 (90 Base) MCG/ACT inhaler Inhale 1-2 puffs into the lungs every 6 (six) hours as needed for wheezing or shortness of breath.    [provider]  BELSOMRA 20 MG TABS Take 20 mg by mouth at bedtime as needed (for sleep). 01/29/21   [provider]  Cholecalciferol (VITAMIN D3 GUMMIES PO) Take 2 tablets by mouth daily.    [provider]  estradiol (ESTRACE) 0.1 MG/GM vaginal cream INSERT 1 GRAM VAGINALLY 2 TIMES A WEEK AT BEDTIME 05/07/22   Romualdo Bolk, MD  gabapentin (NEURONTIN) 800 MG tablet Take 800 mg by mouth at bedtime as needed (for nerve pain).    [provider]  hydrOXYzine (ATARAX) 25 MG tablet Take 1 tablet (25 mg total) by mouth every 6 (six) hours. 08/15/21   Jeannie Fend, PA-C  hydrOXYzine (ATARAX) 25 MG tablet Take 1 tablet (25 mg total) by mouth every 6 (six) hours. 08/15/21   Al Decant, PA-C  levorphanol (LEVODROMORAN) 2 MG tablet Take 2 mg by mouth 3 (three) times daily as needed. 04/08/22   [provider]  phentermine (ADIPEX-P) 37.5 MG tablet Take 37.5 mg by mouth daily before breakfast.    [provider]  Polyethylene Glycol 3350 (MIRALAX PO) Take  17 g by mouth daily as needed (for constipation).    [provider]  pravastatin (PRAVACHOL) 80 MG tablet Take 80 mg by mouth at bedtime. 12/04/20   [provider]  Semaglutide, 1 MG/DOSE, (OZEMPIC, 1 MG/DOSE,) 2 MG/1.5ML SOPN Inject 1 mg into the skin every Friday.    [provider]  topiramate (TOPAMAX) 100 MG tablet Take 100 mg by mouth at bedtime. 12/08/20   [provider]                                                                                                                                    Past Surgical History Past Surgical  History:  Procedure Laterality Date   CESAREAN SECTION     HYSTEROSCOPY WITH D & C  07/28/2002   @[facility]    LEEP N/A 06/11/2021   Procedure: LOOP ELECTROSURGICAL EXCISION PROCEDURE (LEEP) with colposcopy;  Surgeon: Romualdo Bolk, MD;  Location: [facility];  Service: Gynecology;  Laterality: N/A;  Wants the laser to attach to the colposcope   TOTAL KNEE ARTHROPLASTY Right 05/18/2019   Procedure: TOTAL KNEE ARTHROPLASTY;  Surgeon: Durene Romans, MD;  Location: [facility];  Service: Orthopedics;  Laterality: Right;    Family History Family History  Problem Relation Age of Onset   Hypertension Other    Diabetes Other     Social History Social History   Tobacco Use   Smoking status: Never   Smokeless tobacco: Never  Vaping Use   Vaping status: Never Used  Substance Use Topics   Alcohol use: Not Currently    Comment: occasional   Drug use: No   Allergies Patient has no known allergies.  Review of Systems Review of Systems  Constitutional:  Negative for chills and fever.  Respiratory:  Negative for chest tightness and shortness of breath.   Cardiovascular:  Negative for chest pain.  Genitourinary:  Negative for dysuria.  Musculoskeletal:  Positive for arthralgias.  Neurological:  Negative for dizziness and headaches.  All other systems reviewed and are negative.   Physical Exam Vital Signs  I have reviewed the triage vital signs BP 125/66 (BP Location: Right Arm)   Pulse (!) 103   Temp 97.7 F (36.5 C)   Resp 20   Ht 5' 2.5" (1.588 m)   Wt 84.4 kg   LMP 12/08/2011   SpO2 100%   BMI 33.48 kg/m  Physical Exam Vitals and nursing note reviewed.  Constitutional:      General: She is not in acute distress.    Appearance: Normal appearance. She is well-developed. She is not ill-appearing.  HENT:     Head: Normocephalic and atraumatic.     Right Ear: External ear normal.     Left Ear: External ear normal.     Nose: Nose normal.     Mouth/Throat:      Mouth: Mucous membranes are moist.  Eyes:  General: No scleral icterus.       Right eye: No discharge.        Left eye: No discharge.  Cardiovascular:     Rate and Rhythm: Normal rate.  Pulmonary:     Effort: Pulmonary effort is normal. No respiratory distress.     Breath sounds: No stridor.  Abdominal:     General: Abdomen is flat. There is no distension.     Tenderness: There is no guarding.  Musculoskeletal:        General: No deformity.       Arms:     Cervical back: No rigidity.     Comments: Trapezius ttp LUE NVI Radial pulse brisk b/l  Skin:    General: Skin is warm and dry.     Coloration: Skin is not cyanotic, jaundiced or pale.  Neurological:     Mental Status: She is alert and oriented to person, place, and time.     GCS: GCS eye subscore is 4. GCS verbal subscore is 5. GCS motor subscore is 6.  Psychiatric:        Speech: Speech normal.        Behavior: Behavior normal. Behavior is cooperative.     ED Results and Treatments Labs (all labs ordered are listed, but only abnormal results are displayed) Labs Reviewed - No data to display                                                                                                                        Radiology DG Shoulder Left Result Date: 01/31/2023 CLINICAL DATA:  1 week history of left shoulder pain. EXAM: LEFT SHOULDER - 2+ VIEW COMPARISON:  None Available. FINDINGS: No evidence for an acute fracture. No shoulder separation or dislocation. No worrisome lytic or sclerotic osseous abnormality. Mild undersurface spurring seen at the acromion. IMPRESSION: 1. No acute bony findings. 2. Mild undersurface spurring at the acromion. Electronically Signed   By: Kennith Center M.D.   On: 01/31/2023 06:00    Pertinent labs & imaging results that were available during my care of the patient were reviewed by me and considered in my medical decision making (see MDM for details).  Medications Ordered in  ED Medications  ketorolac (TORADOL) 30 MG/ML injection 30 mg (has no administration in time range)  ibuprofen (ADVIL) tablet 800 mg (800 mg Oral Given 01/31/23 0543)  Procedures Procedures  (including critical care time)  Medical Decision Making / ED Course    Medical Decision Making:    RUEY STORER is a 63 y.o. female with past medical history as below, significant for arthritis, cervical dysplasia, Bell's palsy, migraine headaches, DDD, depression, obesity who presents to the ED with complaint of left shoulder pain. The complaint involves an extensive differential diagnosis and also carries with it a high risk of complications and morbidity.  Serious etiology was considered. Ddx includes but is not limited to: Sprain, strain, dislocation, soft tissue, arthritis, etc.  Complete initial physical exam performed, notably the patient was in no distress, resting comfortably.    Reviewed and confirmed nursing documentation for past medical history, family history, social history.  Vital signs reviewed.         Brief summary:  63 year old female history as above including arthritis, DDD, chronic pain syndrome here with left shoulder pain.  No concurrent chest pain, no dyspnea, no numbness or weakness.  Denies any known injury.  X-ray ordered in triage reviewed, no fracture or dislocation, possible bone spur at acromion, give Toradol IM.  Placed in sling.  Follow-up w/ ortho  The patient improved significantly and was discharged in stable condition. Detailed discussions were had with the patient/guardian regarding current findings, and need for close f/u with PCP or on call doctor. The patient/guardian has been instructed to return immediately if the symptoms worsen in any way for re-evaluation. Patient/guardian verbalized understanding and is in agreement  with current care plan. All questions answered prior to discharge.                 Additional history obtained: -Additional history obtained from na -External records from outside source obtained and reviewed including: Chart review including previous notes, labs, imaging, consultation notes including  Prior ED visits, primary care documentation, home medications   Lab Tests: na  EKG   EKG Interpretation Date/Time:    Ventricular Rate:    PR Interval:    QRS Duration:    QT Interval:    QTC Calculation:   R Axis:      Text Interpretation:           Imaging Studies ordered: Shoulder x-ray ordered in triage I independently visualized the following imaging with scope of interpretation limited to determining acute life threatening conditions related to emergency care; findings noted above I independently visualized and interpreted imaging. I agree with the radiologist interpretation   Medicines ordered and prescription drug management: Meds ordered this encounter  Medications   ibuprofen (ADVIL) tablet 800 mg   ketorolac (TORADOL) 30 MG/ML injection 30 mg   cyclobenzaprine (FLEXERIL) 10 MG tablet    Sig: Take 1 tablet (10 mg total) by mouth 2 (two) times daily as needed for muscle spasms.    Dispense:  20 tablet    Refill:  0   ibuprofen (ADVIL) 600 MG tablet    Sig: Take 1 tablet (600 mg total) by mouth every 6 (six) hours as needed.    Dispense:  30 tablet    Refill:  0   lidocaine (LIDODERM) 5 %    Sig: Place 1 patch onto the skin daily as needed. Remove & Discard patch within 12 hours or as directed by MD    Dispense:  15 patch    Refill:  0   acetaminophen (TYLENOL) 325 MG tablet    Sig: Take 2 tablets (650 mg total) by mouth every 6 (six) hours as  needed.    Dispense:  36 tablet    Refill:  0    -I have reviewed the patients home medicines and have made adjustments as needed   Consultations Obtained: na   Cardiac Monitoring: Continuous  pulse oximetry interpreted by myself, 100% on RA.    Social Determinants of Health:  Diagnosis or treatment significantly limited by social determinants of health: obesity   Reevaluation: After the interventions noted above, I reevaluated the patient and found that they have improved  Co morbidities that complicate the patient evaluation  Past Medical History:  Diagnosis Date   Anxiety    Arthritis    back and knee   Cervical dysplasia    History of Bell's palsy 2015   right side, per pt resolved   Migraines    Wears glasses       Dispostion: Disposition decision including need for hospitalization was considered, and patient discharged from emergency department.    Final Clinical Impression(s) / ED Diagnoses Final diagnoses:  Acute pain of right shoulder  Bone spur of acromioclavicular joint, left        Sloan Leiter, DO 01/31/23 [phone]

## 2023-01-31 NOTE — ED Triage Notes (Signed)
  Patient comes in with L shoulder pain that has been going on for a week.  Patient states there was no injury and it was sore one day, and progressively has gotten worse.  Endorses pain on anterior/posterior shoulder palpation.  Has pain when turning head.  No OTC medications for pain.  Pain 10/10, sharp/stabbing.

## 2023-02-22 ENCOUNTER — Ambulatory Visit (HOSPITAL_COMMUNITY)
Admission: EM | Admit: 2023-02-22 | Discharge: 2023-02-22 | Disposition: A | Discharge status: Home / Self Care | Payer: 59 | Attending: Internal Medicine | Admitting: Internal Medicine

## 2023-02-22 ENCOUNTER — Ambulatory Visit (INDEPENDENT_AMBULATORY_CARE_PROVIDER_SITE_OTHER): Payer: 59

## 2023-02-22 ENCOUNTER — Ambulatory Visit (HOSPITAL_COMMUNITY): Payer: 59

## 2023-02-22 DIAGNOSIS — J40 Bronchitis, not specified as acute or chronic: Secondary | ICD-10-CM

## 2023-02-22 DIAGNOSIS — R051 Acute cough: Secondary | ICD-10-CM

## 2023-02-22 DIAGNOSIS — J22 Unspecified acute lower respiratory infection: Secondary | ICD-10-CM

## 2023-02-22 MED ORDER — AZITHROMYCIN 250 MG PO TABS: ORAL_TABLET | ORAL | 0 refills | Status: AC

## 2023-02-22 MED ORDER — PREDNISONE 20 MG PO TABS: 40.0000 mg | ORAL_TABLET | Freq: Every day | ORAL | 0 refills | Status: AC

## 2023-02-22 MED ORDER — PROMETHAZINE-DM 6.25-15 MG/5ML PO SYRP: 5.0000 mL | ORAL_SOLUTION | Freq: Three times a day (TID) | ORAL | 0 refills | Status: AC | PRN

## 2023-02-22 NOTE — ED Triage Notes (Signed)
Pt c/o of coughing, chest congestion, shortness of breath upon exertion, diarrhea, vomiting, fatigue, bilateral ear ringing, dizziness, nasal congestion, body aches, rib cage pain with coughing, and chills.   Home Interventions: Mucinex, Dayquil; No relief   Start Date: Two weeks ago

## 2023-02-22 NOTE — Discharge Instructions (Addendum)
Chest x-ray done today and final radiology showed no active disease however Symptoms have been going on for almost 2 weeks now.  Physical exam findings are suggestive of possible pneumonia versus bronchitis.  Influenza and COVID testing deferred given the symptoms have been going on for 2 weeks.  Reassuring findings are that your oxygen levels are normal and there is no fever.  We will treat with the following: Azithromycin 250mg  Take 2 tablets today and the 1 tablet daily for 4 more days.  This is an antibiotic.  Take this with food. Prednisone 40 mg (2 tablets) once daily for 5 days. Take this in the morning.  This is a steroid to help with inflammation and pain.  Promethazine DM 5 mL every 8 hours as needed for cough.  Use caution as this medication can cause drowsiness.  Continue to use albuterol inhaler every 6 hours as needed for shortness of breath or wheezing Rest and stay hydrated.   Return to urgent care or PCP if symptoms worsen or fail to resolve.

## 2023-02-22 NOTE — ED Provider Notes (Addendum)
[facility]    CSN: [id number] Arrival date & time: 02/22/23  1712      History   Chief Complaint Chief Complaint  Patient presents with   Headache   Cough    HPI Audrey Weeks is a 63 y.o. female.   63 year old female who presents to urgent care with complaints of productive cough, chest rattling, rib pain from coughing, headaches, body aches, nausea, vomiting, diarrhea, fatigue and chills.  She reports that her symptoms started about 2 weeks ago but have gotten worse in the last week.  She is having some shortness of breath as well.  She feels like her chest is rattling.  Her granddaughter was sick also.  She is unsure if she was exposed to influenza or COVID.  She has been using Mucinex and DayQuil with little relief.  She has a albuterol inhaler at home that she has been using as well.   Headache Associated symptoms: congestion, cough, diarrhea, nausea and vomiting   Associated symptoms: no abdominal pain, no back pain, no ear pain, no eye pain, no fever, no seizures and no sore throat   Cough Associated symptoms: chest pain, chills and headaches   Associated symptoms: no ear pain, no fever, no rash, no shortness of breath and no sore throat     Past Medical History:  Diagnosis Date   Anxiety    Arthritis    back and knee   Cervical dysplasia    History of Bell's palsy 2015   right side, per pt resolved   Migraines    Wears glasses     Patient Active Problem List   Diagnosis Date Noted   DDD (degenerative disc disease), lumbar 12/20/2021   Depression 03/13/2021   S/P right TKA 05/18/2019   Arthritis 10/23/2016   OBESITY 05/10/2009   ANXIETY 05/10/2009   MIGRAINE HEADACHE 04/15/2009   PNEUMONIA, COMMUNITY ACQUIRED, PNEUMOCOCCAL 04/15/2009   ASTHMA 04/15/2009    Past Surgical History:  Procedure Laterality Date   CESAREAN SECTION     HYSTEROSCOPY WITH D & C  07/28/2002   @[facility]    LEEP N/A 06/11/2021   Procedure: LOOP ELECTROSURGICAL EXCISION  PROCEDURE (LEEP) with colposcopy;  Surgeon: Romualdo Bolk, MD;  Location: [facility] [place];  Service: Gynecology;  Laterality: N/A;  Wants the laser to attach to the colposcope   TOTAL KNEE ARTHROPLASTY Right 05/18/2019   Procedure: TOTAL KNEE ARTHROPLASTY;  Surgeon: Durene Romans, MD;  Location: [facility];  Service: Orthopedics;  Laterality: Right;     OB History   No obstetric history on file.      Home Medications    Prior to Admission medications   Medication Sig Start Date End Date Taking? Authorizing Provider  acetaminophen (TYLENOL) 500 MG tablet Take 500 mg by mouth every 6 (six) hours as needed for moderate pain.   Yes [provider]  cyclobenzaprine (FLEXERIL) 10 MG tablet Take 1 tablet (10 mg total) by mouth 2 (two) times daily as needed for muscle spasms. 01/31/23  Yes Tanda Rockers A, DO  pravastatin (PRAVACHOL) 80 MG tablet Take 80 mg by mouth at bedtime. 12/04/20  Yes [provider]  topiramate (TOPAMAX) 100 MG tablet Take 100 mg by mouth at bedtime. 12/08/20  Yes [provider]  acetaminophen (TYLENOL) 325 MG tablet Take 2 tablets (650 mg total) by mouth every 6 (six) hours as needed. 01/31/23   Sloan Leiter, DO  albuterol (VENTOLIN HFA) 108 (90 Base) MCG/ACT inhaler  Inhale 1-2 puffs into the lungs every 6 (six) hours as needed for wheezing or shortness of breath.    [provider]  BELSOMRA 20 MG TABS Take 20 mg by mouth at bedtime as needed (for sleep). 01/29/21   [provider]  Cholecalciferol (VITAMIN D3 GUMMIES PO) Take 2 tablets by mouth daily.    [provider]  estradiol (ESTRACE) 0.1 MG/GM vaginal cream INSERT 1 GRAM VAGINALLY 2 TIMES A WEEK AT BEDTIME 05/07/22   Romualdo Bolk, MD  gabapentin (NEURONTIN) 800 MG tablet Take 800 mg by mouth at bedtime as needed (for nerve pain).    [provider]  hydrOXYzine (ATARAX) 25 MG tablet Take 1 tablet (25 mg total) by mouth every 6  (six) hours. 08/15/21   Jeannie Fend, PA-C  hydrOXYzine (ATARAX) 25 MG tablet Take 1 tablet (25 mg total) by mouth every 6 (six) hours. 08/15/21   Al Decant, PA-C  ibuprofen (ADVIL) 600 MG tablet Take 1 tablet (600 mg total) by mouth every 6 (six) hours as needed. 01/31/23   Sloan Leiter, DO  levorphanol (LEVODROMORAN) 2 MG tablet Take 2 mg by mouth 3 (three) times daily as needed. 04/08/22   [provider]  lidocaine (LIDODERM) 5 % Place 1 patch onto the skin daily as needed. Remove & Discard patch within 12 hours or as directed by MD 01/31/23   Sloan Leiter, DO  phentermine (ADIPEX-P) 37.5 MG tablet Take 37.5 mg by mouth daily before breakfast.    [provider]  Polyethylene Glycol 3350 (MIRALAX PO) Take 17 g by mouth daily as needed (for constipation).    [provider]  Semaglutide, 1 MG/DOSE, (OZEMPIC, 1 MG/DOSE,) 2 MG/1.5ML SOPN Inject 1 mg into the skin every Friday.    [provider]    Family History Family History  Problem Relation Age of Onset   Hypertension Other    Diabetes Other     Social History Social History   Tobacco Use   Smoking status: Never   Smokeless tobacco: Never  Vaping Use   Vaping status: Never Used  Substance Use Topics   Alcohol use: Not Currently    Comment: occasional   Drug use: No     Allergies   Patient has no known allergies.   Review of Systems Review of Systems  Constitutional:  Positive for appetite change and chills. Negative for fever.  HENT:  Positive for congestion. Negative for ear pain and sore throat.   Eyes:  Negative for pain and visual disturbance.  Respiratory:  Positive for cough and chest tightness. Negative for shortness of breath.   Cardiovascular:  Positive for chest pain. Negative for palpitations.  Gastrointestinal:  Positive for diarrhea, nausea and vomiting. Negative for abdominal pain.  Genitourinary:  Negative for dysuria and hematuria.  Musculoskeletal:   Negative for arthralgias and back pain.  Skin:  Negative for color change and rash.  Neurological:  Positive for headaches. Negative for seizures and syncope.  All other systems reviewed and are negative.    Physical Exam Triage Vital Signs ED Triage Vitals  Encounter Vitals Group     BP 02/22/23 1755 134/82     Systolic BP Percentile --      Diastolic BP Percentile --      Pulse Rate 02/22/23 1755 77     Resp 02/22/23 1755 18     Temp 02/22/23 1755 98.3 F (36.8 C)     Temp Source 02/22/23  1755 Oral     SpO2 02/22/23 1755 98 %     Weight --      Height --      Head Circumference --      Peak Flow --      Pain Score 02/22/23 1749 10     Pain Loc --      Pain Education --      Exclude from Growth Chart --    No data found.  Updated Vital Signs BP 134/82 (BP Location: Right Arm)   Pulse 77   Temp 98.3 F (36.8 C) (Oral)   Resp 18   LMP 12/08/2011   SpO2 98%   Visual Acuity Right Eye Distance:   Left Eye Distance:   Bilateral Distance:    Right Eye Near:   Left Eye Near:    Bilateral Near:     Physical Exam Vitals and nursing note reviewed.  Constitutional:      General: She is not in acute distress.    Appearance: She is well-developed.  HENT:     Head: Normocephalic and atraumatic.  Eyes:     Conjunctiva/sclera: Conjunctivae normal.  Cardiovascular:     Rate and Rhythm: Normal rate and regular rhythm.     Heart sounds: No murmur heard. Pulmonary:     Effort: Pulmonary effort is normal. No respiratory distress.     Breath sounds: Examination of the right-upper field reveals wheezing and rhonchi. Examination of the left-upper field reveals wheezing and rhonchi. Examination of the right-middle field reveals rhonchi. Examination of the left-middle field reveals rhonchi. Wheezing and rhonchi present.     Comments: Coarse breath sounds bilateral Abdominal:     Palpations: Abdomen is soft.     Tenderness: There is no abdominal tenderness.  Musculoskeletal:         General: No swelling.     Cervical back: Neck supple.  Skin:    General: Skin is warm and dry.     Capillary Refill: Capillary refill takes less than 2 seconds.  Neurological:     Mental Status: She is alert.  Psychiatric:        Mood and Affect: Mood normal.      UC Treatments / Results  Labs (all labs ordered are listed, but only abnormal results are displayed) Labs Reviewed - No data to display  EKG   Radiology DG Chest 2 View Result Date: 02/22/2023 CLINICAL DATA:  Cough, shortness of breath. EXAM: CHEST - 2 VIEW COMPARISON:  Chest radiograph dated 07/17/2020. FINDINGS: The heart size and mediastinal contours are within normal limits. Vascular calcifications are seen in the aortic arch. Both lungs are clear. Degenerative changes are seen in the spine. IMPRESSION: No active cardiopulmonary disease. Electronically Signed   By: Romona Curls M.D.   On: 02/22/2023 18:58    Procedures Procedures (including critical care time)  Medications Ordered in UC Medications - No data to display  Initial Impression / Assessment and Plan / UC Course  I have reviewed the triage vital signs and the nursing notes.  Pertinent labs & imaging results that were available during my care of the patient were reviewed by me and considered in my medical decision making (see chart for details).    Acute cough - Plan: DG Chest 2 View, DG Chest 2 View  Bronchitis  Lower respiratory infection     Chest x-ray done today and final radiology showed no active disease however Symptoms have been going on for almost 2 weeks  now.  Physical exam findings are suggestive of possible pneumonia versus bronchitis.  Influenza and COVID testing deferred given the symptoms have been going on for 2 weeks.  We will treat with the following: Azithromycin 250mg  Take 2 tablets today and the 1 tablet daily for 4 more days.  This is an antibiotic.  Take this with food. Prednisone 40 mg (2 tablets) once daily for 5  days. Take this in the morning.  This is a steroid to help with inflammation and pain.  Promethazine DM 5 mL every 8 hours as needed for cough.  Use caution as this medication can cause drowsiness.  Continue to use albuterol inhaler every 6 hours as needed for shortness of breath or wheezing Rest and stay hydrated.   Return to urgent care or PCP if symptoms worsen or fail to resolve.    Final Clinical Impressions(s) / UC Diagnoses   Final diagnoses:  Acute cough  Bronchitis  Lower respiratory infection     Discharge Instructions      Chest x-ray done today and final radiology showed no active disease however Symptoms have been going on for almost 2 weeks now.  Physical exam findings are suggestive of possible pneumonia versus bronchitis.  Influenza and COVID testing deferred given the symptoms have been going on for 2 weeks.  We will treat with the following: Augmentin 875 mg twice daily for 7 days.  This is an antibiotic.  Take this with food.  Prednisone 40 mg (2 tablets) once daily for 5 days. Take this in the morning.  This is a steroid to help with inflammation and pain.  Promethazine DM 5 mL every 8 hours as needed for cough.  Use caution as this medication can cause drowsiness.  Continue to use albuterol inhaler every 6 hours as needed for shortness of breath or wheezing Rest and stay hydrated.   Return to urgent care or PCP if symptoms worsen or fail to resolve.       ED Prescriptions   None    PDMP not reviewed this encounter.   Landis Martins, PA-C 02/22/23 1902    Landis Martins, PA-C 02/22/23 1903

## 2023-11-27 ENCOUNTER — Other Ambulatory Visit: Payer: Self-pay | Admitting: Adult Health Nurse Practitioner

## 2023-11-27 ENCOUNTER — Ambulatory Visit
Admission: RE | Admit: 2023-11-27 | Discharge: 2023-11-27 | Disposition: A | Discharge status: Home / Self Care | Source: Ambulatory Visit | Attending: Adult Health Nurse Practitioner | Admitting: Adult Health Nurse Practitioner

## 2023-11-27 DIAGNOSIS — M25561 Pain in right knee: Secondary | ICD-10-CM

## 2023-11-27 DIAGNOSIS — M25571 Pain in right ankle and joints of right foot: Secondary | ICD-10-CM
